# Patient Record
Sex: Female | Born: 1950 | Race: White | Hispanic: No
Health system: Midwestern US, Academic
[De-identification: ages and names within clinical notes are randomized; demographics above are authoritative.]

---

## 2016-07-22 MED ORDER — HYDROCHLOROTHIAZIDE 25 MG PO TAB
ORAL_TABLET | Freq: Every day | ORAL | 3 refills | 28.00000 days | Status: DC
Start: 2016-07-22 — End: 2017-08-12

## 2016-08-02 ENCOUNTER — Ambulatory Visit: Admit: 2016-08-02 | Discharge: 2016-08-02 | Payer: MEDICARE

## 2016-08-02 ENCOUNTER — Ambulatory Visit: Admit: 2016-08-02 | Discharge: 2016-08-03 | Payer: MEDICARE

## 2016-08-02 DIAGNOSIS — E782 Mixed hyperlipidemia: ICD-10-CM

## 2016-08-02 DIAGNOSIS — I10 Essential (primary) hypertension: ICD-10-CM

## 2016-08-02 DIAGNOSIS — R0600 Dyspnea, unspecified: ICD-10-CM

## 2016-08-02 DIAGNOSIS — R634 Abnormal weight loss: ICD-10-CM

## 2016-08-02 DIAGNOSIS — I5032 Chronic diastolic (congestive) heart failure: Principal | ICD-10-CM

## 2016-08-02 MED ORDER — AMINOPHYLLINE 500 MG/20 ML IV SOLN
50 mg | INTRAVENOUS | 0 refills | Status: AC | PRN
Start: 2016-08-02 — End: ?

## 2016-08-02 MED ORDER — PERFLUTREN LIPID MICROSPHERES 1.1 MG/ML IV SUSP
1-20 mL | Freq: Once | INTRAVENOUS | 0 refills | Status: CP
Start: 2016-08-02 — End: ?

## 2016-08-02 MED ORDER — NITROGLYCERIN 0.4 MG SL SUBL
.4 mg | SUBLINGUAL | 0 refills | Status: AC | PRN
Start: 2016-08-02 — End: ?

## 2016-08-02 MED ORDER — ALBUTEROL SULFATE 90 MCG/ACTUATION IN HFAA
2 | RESPIRATORY_TRACT | 0 refills | Status: DC | PRN
Start: 2016-08-02 — End: 2016-08-07

## 2016-08-02 MED ORDER — SODIUM CHLORIDE 0.9 % IV SOLP
250 mL | INTRAVENOUS | 0 refills | Status: AC | PRN
Start: 2016-08-02 — End: ?

## 2016-08-02 MED ORDER — REGADENOSON 0.4 MG/5 ML IV SYRG
.4 mg | Freq: Once | INTRAVENOUS | 0 refills | Status: CP
Start: 2016-08-02 — End: ?

## 2016-08-02 NOTE — Progress Notes
Peripheral IV Insertion Note:  Patient Side: left  Line Orientation:Hand  IV Catheter Size: 20G  Number of Attempts:1.  IV capped and flushed with Normal Saline.  IV site without redness, swelling, or pain.  New dressing placed.    After procedure IV cannula removed intact and hemostasis achieved.    Procedure explained, questions answered and Definity administered per standard without complications.   Total of 58ml of Definity/NS given slow IVP per sonographer direction.

## 2016-08-06 ENCOUNTER — Encounter: Admit: 2016-08-06 | Discharge: 2016-08-06 | Payer: MEDICARE

## 2016-08-06 ENCOUNTER — Ambulatory Visit: Admit: 2016-08-06 | Discharge: 2016-08-07 | Payer: MEDICARE

## 2016-08-06 DIAGNOSIS — R635 Abnormal weight gain: ICD-10-CM

## 2016-08-06 DIAGNOSIS — G5603 Carpal tunnel syndrome, bilateral upper limbs: Principal | ICD-10-CM

## 2016-08-06 DIAGNOSIS — J189 Pneumonia, unspecified organism: ICD-10-CM

## 2016-08-06 DIAGNOSIS — G936 Cerebral edema: Principal | ICD-10-CM

## 2016-08-06 DIAGNOSIS — I10 Essential (primary) hypertension: Principal | ICD-10-CM

## 2016-08-06 DIAGNOSIS — R930 Abnormal findings on diagnostic imaging of skull and head, not elsewhere classified: ICD-10-CM

## 2016-08-06 DIAGNOSIS — R634 Abnormal weight loss: ICD-10-CM

## 2016-08-06 NOTE — Progress Notes
Neurosurgery Clinic History and Physical Examination    Alicia Fox  Clinic Date:  08/09/2016                       Assessment/Plan:  Alicia Fox is a 66 y.o. female with pain and numbness in her hands left worse than right.    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  - CT head with some concern for possible abnormal hypodensity in left frontal lobe with distant history of breast cancer. - Will obtain MRI head w/wo contrast to complete examination for any mass lesion relating to symptoms  - MRI cervical spine reviewed without significant correlation between adjacent segment degeneration above prior fusion and symptoms  - Will obtain EMG/NC to evaluate nerves especially the left median distribuation.    - Following these studies patient may benefit from left carpal tunnel decompression which will be discussed at future appointment        Primary Care Provider/Referring Provider: Adriana Mccallum, MD      __________________________________________________________________________________    Chief Complaint:  Left arm and hand numbness and pain, decreased fine motor control    History of Present Illness:   Alicia Fox is a 66 y.o. right handed female who has a distant history of breast cancer undergoing multiple surgical treatments for this in approximately 1986.  She in April had a mechanical fall over an object in the house which resulted in a visit to the emergency room.  At that point a CT head was performed which showed possibility of hypodensity on the left frontal region on the CT.  She was told at that time that she had a stroke but no further MRI imaging or evaluation was performed.    After the fall she did have some mild neck pain but she describes it more as an aching in her left arm.  Her pain usually wakes her up at night especially if she is laying flat and she has to get up and move and shake her arms to get the numbness to go away.  Over the last few weeks she has noticed that her right hand is been doing this is well in the thumb and first 2 digits.  The biggest and most consistent symptoms have been numbness in her left thumb and first 2-1/2 digits with a clear acquisition in the middle of the left ring finger.  She is also noticed some mild difficulty with manipulation in her left hand rater than her right hand.  Of note she has had previous cervical 5 6 and C6-7 ACDF's these were done a number of years ago.  During the workup for this she has undergone MRI imaging of the cervical spine.  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  There does not appear to be slightly exaggerated to the left greater than the right.    Unfortunately during the time of all of this she has been caring for her parents and as of recently her father has passed away.  During this time she was having to help him significantly with ambulation causing her a lot of emotional as well as physical stress during these times.  She has not had any further episodes of falls and more specifically she has had no episodes of loss of consciousness or speech difficulty  or weakness in her arms or legs.  She has been able to loose around 30 pounds over the last couple of months intentionally.    She does state that she is currently undergoing work-up for abnormal lab finding of hypercalcemia, but so far there have not been any pathological states discovered to explain this phenomenon.    Past Medical History:  Past Medical History:   Diagnosis Date   ??? HTN (hypertension)    ??? Pneumonia     bilateral bacterial       Past Surgical History:  Past Surgical History:   Procedure Laterality Date   ??? BREAST SURGERY  07/1984    biopsy ??? BREAST SURGERY Bilateral 12/1984    mastectomy   ??? BREAST SURGERY Bilateral 07/1985    modified mastectomy   ??? BREAST SURGERY Bilateral 07/1985    implants   ??? HX DILATION AND CURETTAGE  07/1994   ??? HX HYSTERECTOMY  10/1994    complete   ??? HX ABDOMINOPLASTY  10/1994   ??? HX LIPOMA RESECTION  11/1999    outer upper left arm   ??? ANUS SURGERY  01/2010    anal fissure   ??? HX LIPOMA RESECTION  02/2010    inside upper right arm   ??? SPINE SURGERY  1993 and 1996    cervical fusion       Social History:  Social History   Substance Use Topics   ??? Smoking status: Former Smoker     Quit date: 03/04/1989   ??? Smokeless tobacco: Never Used      Comment: quit in 1991   ??? Alcohol use Yes      Comment: very seldom       Family History:  Family History   Problem Relation Age of Onset   ??? Hypertension Mother    ??? Hypertension Father        Allergies:    Allergies   Allergen Reactions   ??? Codeine HEADACHE       Medications:    Current Outpatient Prescriptions:   ???  amLODIPine (NORVASC) 2.5 mg tablet, Take 1 tablet by mouth twice daily. (Patient taking differently: Take 2.5 mg by mouth daily.), Disp: 180 tablet, Rfl: 3  ???  aspirin EC 81 mg tablet, Take 1 Tab by mouth daily., Disp: 90 Tab, Rfl: 3  ???  atorvastatin (LIPITOR) 10 mg tablet, Take 10 mg by mouth daily., Disp: , Rfl:   ???  CALCIUM POLYCARBOPHIL (FIBERCON PO), Take  by mouth twice daily., Disp: , Rfl:   ???  Cranberry Extract 500 mg cap, Take 1 capsule by mouth daily., Disp: , Rfl:   ???  Folic Acid 800 mcg tab, Take 1 tablet by mouth daily., Disp: , Rfl:   ???  furosemide (LASIX) 20 mg tablet, Take 20 mg by mouth as Needed., Disp: , Rfl:   ???  hydroCHLOROthiazide (HYDRODIURIL) 25 mg tablet, TAKE 1 TAB BY MOUTH DAILY., Disp: 90 tablet, Rfl: 3  ???  KRILL OIL-OMEGA-3-DHA-EPA PO, Take 350 mg by mouth daily., Disp: , Rfl:   ???  levothyroxine (SYNTHROID) 88 mcg tablet, Take 88 mcg by mouth daily 30 minutes before breakfast., Disp: , Rfl: ???  losartan(+) (COZAAR) 100 mg tablet, TAKE 1 TAB BY MOUTH TWICE DAILY., Disp: 180 tablet, Rfl: 3  ???  vit C,E-Zn-coppr-lutein-zeaxan (OCUVITE LUTEIN AND ZEAXANTHIN) 60 mg-30 unit- 15 mg-2 mg-6 mg cap, Take 1 tablet by mouth daily., Disp: , Rfl:     Review of  Systems:   Full 10 point review of systems negative except for HPI    Physical Exam:    BP 145/82 (BP Source: Arm, Left, Patient Position: Sitting)  - Pulse 89  - Ht 162.6 cm (64)  - Wt 115.7 kg (255 lb)  - BMI 43.77 kg/m???   General appearance: No acute distress  Lungs: Non labored on room air, symmetric chest rise  Heart: Regular rate and rhythm  Gastrointestinal: Soft, non-tender. Bowel sounds normal. no masses,  no organomegaly  Musculoskeletal: No edema, redness or tenderness in the calves or thighs  Skin: Integument intact without major lesion.  Psychiatric: Normal affect    Neurologic Exam:   Mental Status: Awake, alert and oriented x 4, fluent speech, normal cognition  Pupils: Pupils equal round and reactive to light    Cranial Nerves: CN II-XII individually tested  Vision intact to finger counting with full visual field on confrontation.  EOMI in all directions.  Facial sensation symmetric in V1,V2,V3, intact jaw clench.  Symmetric facial expressions.  Intact hearing to finger rub bilaterally.  Symmetric palatal rise. Gag not Tested.  Strong voluntary cough, voice normal phonation.  Tongue midline.    Motor:   AA EF EE WF WE G HF KF KE DF PF EHL   Left 5 5 5 5 5 4 5 5 5 5 5 5    Right 5 5 5 5 5 5 5 5 5 5 5 5    She does have mild thenar atrophy on the left versus right. She has weakness in opponens pollicis.  Positive Tinel with sensations to the shoulder on the left side.  Normal muscle bulk and tone  No pronator drift    Sensation: Sensation changes in bilateral thumb and first two digits bilaterally on the palmar surface.  The left hand clearly has a deccisation mark to the sensory change in the middle of the ring finger. The tingling and numbness that she senses during tinel test goes half way up the left forearm on the palmar side    Deep Tendon Reflexes:   Tr Bi Br Pa Ac   Left 2 2 2 2 2    Right 2 2 2 2 2    Plantar responses toes downgoing bilaterally  No clonus bilaterally  No hoffman's sign bilaterally    Gait: Normal gait,  Cerebellar: No dysmetria, No dysdiadochokinesia  Rectal: deferred    Lab Tests:      Radiology and other Diagnostics Review:          Samantha Crimes, MD

## 2016-08-06 NOTE — Progress Notes
Recd call from [place] from providers office to schedule EMG

## 2016-08-07 ENCOUNTER — Encounter: Admit: 2016-08-07 | Discharge: 2016-08-07 | Payer: MEDICARE

## 2016-08-07 ENCOUNTER — Ambulatory Visit: Admit: 2016-08-07 | Discharge: 2016-08-08 | Payer: MEDICARE

## 2016-08-07 DIAGNOSIS — R69 Illness, unspecified: Principal | ICD-10-CM

## 2016-08-07 DIAGNOSIS — J189 Pneumonia, unspecified organism: ICD-10-CM

## 2016-08-07 DIAGNOSIS — I10 Essential (primary) hypertension: Principal | ICD-10-CM

## 2016-08-07 NOTE — Procedures
Please refer to EMG report for study results.  No separate note placed for procedure.  Patient well tolerated the procedure and was sent home with no complications.

## 2016-08-08 ENCOUNTER — Encounter: Admit: 2016-08-08 | Discharge: 2016-08-08 | Payer: MEDICARE

## 2016-08-08 ENCOUNTER — Ambulatory Visit: Admit: 2016-08-08 | Discharge: 2016-08-08 | Payer: MEDICARE

## 2016-08-08 DIAGNOSIS — G936 Cerebral edema: Principal | ICD-10-CM

## 2016-08-08 DIAGNOSIS — G5603 Carpal tunnel syndrome, bilateral upper limbs: Principal | ICD-10-CM

## 2016-08-08 DIAGNOSIS — J189 Pneumonia, unspecified organism: ICD-10-CM

## 2016-08-08 DIAGNOSIS — I10 Essential (primary) hypertension: Principal | ICD-10-CM

## 2016-08-08 LAB — POC CREATININE, RAD: Lab: 0.6 mg/dL — ABNORMAL HIGH (ref 0.4–1.00)

## 2016-08-08 MED ORDER — SODIUM CHLORIDE 0.9 % IJ SOLN
10 mL | Freq: Once | INTRAVENOUS | 0 refills | Status: CP
Start: 2016-08-08 — End: ?
  Administered 2016-08-08: 19:00:00 10 mL via INTRAVENOUS

## 2016-08-08 MED ORDER — GADOBENATE DIMEGLUMINE 529 MG/ML (0.1MMOL/0.2ML) IV SOLN
20 mL | Freq: Once | INTRAVENOUS | 0 refills | Status: CP
Start: 2016-08-08 — End: ?
  Administered 2016-08-08: 19:00:00 20 mL via INTRAVENOUS

## 2016-08-13 NOTE — Progress Notes
ATTESTATION    I personally observed the resident performing the E/M, I examined the patient, discussed case with resident and patient, and concur with resident documentation of history, physical assessment and treatment plan unless otherwise noted.  Main symptom is numbness in left distal median distribution.  Tinel's and Phalen's positive at left wrist.  Extensive post-surgical change on MRI C-spine.  Will check electro-diagnsotic studies of upper extremities as she has some similar symptoms on right.  Will also check MRI brain for follow up evaluation of hypodensity noted on CT imaging.    Staff Name: Ace Gins, MD                                 Date: 08/13/2016

## 2016-08-21 ENCOUNTER — Encounter: Admit: 2016-08-21 | Discharge: 2016-08-21 | Payer: MEDICARE

## 2016-08-21 NOTE — Telephone Encounter
-----   Message from Samantha Crimes, MD sent at 08/20/2016 10:49 PM CDT -----  Please call this patient when you have a chance and inform her that both the echo and the stress test look fine. Cont the same meds.  Thank you!    ----- Message -----  From: Valda Lamb, MD  Sent: 08/02/2016   2:54 PM  To: Samantha Crimes, MD

## 2016-08-21 NOTE — Telephone Encounter
Called and discussed results with patient.  No questions at this time.  Pt will callback with any questions, concerns or problems.

## 2016-09-24 ENCOUNTER — Ambulatory Visit: Admit: 2016-09-24 | Discharge: 2016-09-25 | Payer: MEDICARE

## 2016-09-24 DIAGNOSIS — M47812 Spondylosis without myelopathy or radiculopathy, cervical region: ICD-10-CM

## 2016-09-24 DIAGNOSIS — G5603 Carpal tunnel syndrome, bilateral upper limbs: Principal | ICD-10-CM

## 2016-09-24 NOTE — Progress Notes
Alicia Fox return to the neurosurgery clinic today for her follow-up appointment.    She continues to complain of prominent numbness infecting the distal aspect of the left upper extremity.  This affects especially the first 3 digits of the left hand and the lateral aspect of the fourth digit.  It tends to awaken her at night.  She has similar but less prominent symptoms involving the right hand.    She has been evaluated by Dr. Sunday Corn and has had electricodiagnostic testing of each upper extremity.  The study demonstrated severe carpal tunnel syndrome on the left with slightly less prominent but similar findings on the right.  There were no findings of cervical radiculopathy.    She is undergone a cardiac stress test and was told that this study was stable.  She has had no recent fevers, chills, cough or other constitutional symptoms.    Her current medications include Norvasc, Lipitor, Lasix, Synthroid, Cozaar, vitamin supplementation and aspirin (81 mg).  She is allergic to codeine.    On examination, she is awake and alert with a fluent speech pattern.  To direct testing, her motor strength is 5/5 throughout.  She does have some trace thenar atrophic change and also some trace weakness of left abductor pollicis brevis.  She has diminished pinprick in the distal left median distribution.  Tinel's and Phalen's sign are positive at each wrist.    MRI examination of the brain demonstrates no obvious mass, shift of midline structures or abnormal enhancement following the administration of contrast.    I reviewed the situation carefully with [name].  As previously noted, we have not recommended surgical treatment with regard to the cervical spine.  Her clinical and electrodiagnostic testing are consistent with carpal tunnel syndrome which is more bothersome on the left.  We did discuss the background of treatment for this (carpal tunnel release) to include risks, possible complications and expected benefits.  She expresses understanding and wishes to proceed.    Arrangements will be made for a presurgery testing appointment and for scheduling of the procedure.  I gave her contact information should she have any further questions in the interim.

## 2016-10-08 ENCOUNTER — Encounter: Admit: 2016-10-08 | Discharge: 2016-10-08 | Payer: MEDICARE

## 2016-10-08 DIAGNOSIS — G5602 Carpal tunnel syndrome, left upper limb: Principal | ICD-10-CM

## 2016-10-09 ENCOUNTER — Encounter: Admit: 2016-10-09 | Discharge: 2016-10-09 | Payer: MEDICARE

## 2016-10-09 NOTE — Telephone Encounter
Alicia Fox.D in PAT called and requested Dr. Jacqlyn Krauss approval to hold aspirin for 7 days prior to carpel tunnel release.  Procedure is scheduled for 8/16.  Discussed with Dr. Virgina Organ who states that it is fine to hold.  Gave Dr. Jacqlyn Krauss recommendations to [facility].

## 2016-10-14 ENCOUNTER — Ambulatory Visit: Admit: 2016-10-14 | Discharge: 2016-10-14 | Payer: MEDICARE

## 2016-10-14 ENCOUNTER — Ambulatory Visit: Admit: 2016-10-14 | Discharge: 2016-10-15 | Payer: MEDICARE

## 2016-10-14 ENCOUNTER — Encounter: Admit: 2016-10-14 | Discharge: 2016-10-14 | Payer: MEDICARE

## 2016-10-14 ENCOUNTER — Ambulatory Visit: Admit: 2016-10-17 | Discharge: 2016-10-17 | Payer: MEDICARE

## 2016-10-14 DIAGNOSIS — M1990 Unspecified osteoarthritis, unspecified site: ICD-10-CM

## 2016-10-14 DIAGNOSIS — C801 Malignant (primary) neoplasm, unspecified: ICD-10-CM

## 2016-10-14 DIAGNOSIS — K929 Disease of digestive system, unspecified: ICD-10-CM

## 2016-10-14 DIAGNOSIS — I10 Essential (primary) hypertension: Principal | ICD-10-CM

## 2016-10-14 DIAGNOSIS — E785 Hyperlipidemia, unspecified: ICD-10-CM

## 2016-10-14 DIAGNOSIS — F99 Mental disorder, not otherwise specified: ICD-10-CM

## 2016-10-14 DIAGNOSIS — J189 Pneumonia, unspecified organism: ICD-10-CM

## 2016-10-14 DIAGNOSIS — G5600 Carpal tunnel syndrome, unspecified upper limb: Principal | ICD-10-CM

## 2016-10-14 LAB — COMPREHENSIVE METABOLIC PANEL
Lab: 0.6 mg/dL (ref 0.4–1.00)
Lab: 0.7 mg/dL (ref 0.3–1.2)
Lab: 102 MMOL/L (ref 98–110)
Lab: 11 mg/dL — ABNORMAL HIGH (ref 8.5–10.6)
Lab: 118 U/L — ABNORMAL HIGH (ref 25–110)
Lab: 12 U/L (ref 7–40)
Lab: 134 mg/dL — ABNORMAL HIGH (ref 70–100)
Lab: 139 MMOL/L — ABNORMAL HIGH (ref 137–147)
Lab: 14 U/L (ref 7–56)
Lab: 27 mg/dL — ABNORMAL HIGH (ref 7–25)
Lab: 28 MMOL/L (ref 21–30)
Lab: 3.6 MMOL/L (ref 3.5–5.1)
Lab: 4.4 g/dL (ref 3.5–5.0)
Lab: 7.7 g/dL (ref 6.0–8.0)
Lab: 9 (ref 3–12)
Lab: >60 mL/min (ref >60)

## 2016-10-14 LAB — CBC: Lab: 9 10*3/uL — ABNORMAL HIGH (ref >60)

## 2016-10-14 NOTE — Pre-Anesthesia Patient Instructions
GENERAL INFORMATION    Before you come to the hospital  ??? Make arrangements for a responsible adult to drive you home and stay with you for 24 hours following surgery.  ??? Bath/Shower Instructions  ??? Take a bath or shower using the special soap given to you in PAC. Use half the bottle the night before, and the other half the morning of your procedure. Use clean towels with each bath or shower.  ??? Put on clean clothes after bath or shower.  Avoid using lotion and oils.  ??? If you are having surgery above the waist, wear a shirt that fastens up the front.  ??? Sleep on clean sheets if bath or shower is done the night before procedure.  ??? Leave money, credit cards, jewelry, and any other valuables at home. The [facility] is not responsible for the loss or breakage of personal items.  ??? Remove nail polish, makeup and all jewelry (including piercings) before coming to the hospital.  ??? The morning of your procedure:  ??? brush your teeth and tongue  ??? do not smoke  ??? do not shave the area where you will have surgery    What to bring to the hospital  ??? ID/ Insurance Card  ??? Medical Device card  ??? Official documents for legal guardianship   ??? Copy of your Living Will, Advanced Directives, and/or Durable Power of Attorney   ??? Small bag with a few personal belongings  ??? Walker,cane, or motorized scooter  ??? Cases for glasses/hearing aids/contact lens (bring solutions for contacts)  ??? Dress in clean, loose, comfortable clothing     Eating or drinking before surgery  ??? Do not eat or drink anything after 11:00 p.m. the day before your procedure (including gum, mints, candy, or chewing tobacco) OR follow the specific instructions you were given by your Surgeon.  ??? You may have WATER ONLY up to 2 hours before arriving at the hospital.  ??? Other instructions: none     Other instructions  Notify your surgeon if:  ??? there is a possibility that you are pregnant ??? you become ill with a cough, fever, sore throat, nausea, vomiting or flu-like symptoms  ??? you have any open wounds/sores that are red, painful, draining, or are new since you last saw  the doctor  ??? you need to cancel your procedure  ??? You will receive a call with your surgery arrival time from between 2:30pm and 4:30pm the last business day before your procedure.  If you do not receive a call, please call [phone] before 4:30pm or [phone] after 4:30pm.    Notify us at [facility]: [phone]  ??? if you need to cancel your procedure  ??? if you are going to be late    Arrival at the hospital  [facility]  [address]  [place], [place] [address]    ? Park in the P5 parking garage located at [employer], [place], [place] [address].   ? Judee Clara parking is available in front of [employer] [facility] between the hours of 7:00 am and 4:00 pm Monday through Friday.  ? If parking in the P5 garage, take the east elevators in the parking garage to the second level and walk to the entrance of the [employer] [facility].    ? Enter through the 1st floor main entrance and check in with Information Desk.   ? If you are a woman between the  ages of 79 and 42, and have not had a hysterectomy, you will be asked for a urine sample prior to surgery.  Please do not urinate before arriving in the Surgery Waiting Room.  Once there, check in and let the attendant know if you need to provide a sample.

## 2016-10-17 ENCOUNTER — Ambulatory Visit: Admit: 2016-10-17 | Discharge: 2016-10-17 | Payer: MEDICARE

## 2016-10-17 ENCOUNTER — Encounter: Admit: 2016-10-17 | Discharge: 2016-10-17 | Payer: MEDICARE

## 2016-10-17 DIAGNOSIS — M1990 Unspecified osteoarthritis, unspecified site: ICD-10-CM

## 2016-10-17 DIAGNOSIS — F99 Mental disorder, not otherwise specified: ICD-10-CM

## 2016-10-17 DIAGNOSIS — J189 Pneumonia, unspecified organism: ICD-10-CM

## 2016-10-17 DIAGNOSIS — E785 Hyperlipidemia, unspecified: ICD-10-CM

## 2016-10-17 DIAGNOSIS — K929 Disease of digestive system, unspecified: ICD-10-CM

## 2016-10-17 DIAGNOSIS — G5602 Carpal tunnel syndrome, left upper limb: Principal | ICD-10-CM

## 2016-10-17 DIAGNOSIS — C801 Malignant (primary) neoplasm, unspecified: ICD-10-CM

## 2016-10-17 DIAGNOSIS — I10 Essential (primary) hypertension: Principal | ICD-10-CM

## 2016-10-17 MED ORDER — FENTANYL CITRATE (PF) 50 MCG/ML IJ SOLN
0 refills | Status: DC
Start: 2016-10-17 — End: 2016-10-17
  Administered 2016-10-17: 13:00:00 50 ug via INTRAVENOUS

## 2016-10-17 MED ORDER — FENTANYL CITRATE (PF) 50 MCG/ML IJ SOLN
25 ug | INTRAVENOUS | 0 refills | Status: DC | PRN
Start: 2016-10-17 — End: 2016-10-17
  Administered 2016-10-17: 15:00:00 25 ug via INTRAVENOUS

## 2016-10-17 MED ORDER — DEXTRAN 70-HYPROMELLOSE (PF) 0.1-0.3 % OP DPET
0 refills | Status: DC
Start: 2016-10-17 — End: 2016-10-17
  Administered 2016-10-17: 13:00:00 2 [drp] via OPHTHALMIC

## 2016-10-17 MED ORDER — PROPOFOL INJ 10 MG/ML IV VIAL
0 refills | Status: DC
Start: 2016-10-17 — End: 2016-10-17
  Administered 2016-10-17: 13:00:00 50 mg via INTRAVENOUS
  Administered 2016-10-17: 13:00:00 150 mg via INTRAVENOUS

## 2016-10-17 MED ORDER — LIDOCAINE (PF) 10 MG/ML (1 %) IJ SOLN
.1-2 mL | INTRAMUSCULAR | 0 refills | Status: DC | PRN
Start: 2016-10-17 — End: 2016-10-17

## 2016-10-17 MED ORDER — ONDANSETRON HCL (PF) 4 MG/2 ML IJ SOLN
INTRAVENOUS | 0 refills | Status: DC
Start: 2016-10-17 — End: 2016-10-17
  Administered 2016-10-17: 13:00:00 4 mg via INTRAVENOUS

## 2016-10-17 MED ORDER — FAMOTIDINE (PF) 20 MG/2 ML IV SOLN
0 refills | Status: DC
Start: 2016-10-17 — End: 2016-10-17
  Administered 2016-10-17: 13:00:00 20 mg via INTRAVENOUS

## 2016-10-17 MED ORDER — DIPHENHYDRAMINE HCL 50 MG/ML IJ SOLN
25 mg | Freq: Once | INTRAVENOUS | 0 refills | Status: DC | PRN
Start: 2016-10-17 — End: 2016-10-17

## 2016-10-17 MED ORDER — LIDOCAINE (PF) 200 MG/10 ML (2 %) IJ SYRG
0 refills | Status: DC
Start: 2016-10-17 — End: 2016-10-17
  Administered 2016-10-17: 13:00:00 120 mg via INTRAVENOUS

## 2016-10-17 MED ORDER — LIDOCAINE HCL 10 MG/ML (1 %) IJ SOLN
0 refills | Status: DC
Start: 2016-10-17 — End: 2016-10-17
  Administered 2016-10-17: 13:00:00 2 mL via INTRAMUSCULAR

## 2016-10-17 MED ORDER — SCOPOLAMINE BASE 1 MG OVER 3 DAYS TD PT3D
1 | Freq: Once | TRANSDERMAL | 0 refills | Status: DC
Start: 2016-10-17 — End: 2016-10-17

## 2016-10-17 MED ORDER — DEXAMETHASONE SODIUM PHOSPHATE 4 MG/ML IJ SOLN
INTRAVENOUS | 0 refills | Status: DC
Start: 2016-10-17 — End: 2016-10-17
  Administered 2016-10-17: 13:00:00 4 mg via INTRAVENOUS

## 2016-10-17 MED ORDER — CEFAZOLIN 1 GRAM IJ SOLR
0 refills | Status: DC
Start: 2016-10-17 — End: 2016-10-17
  Administered 2016-10-17: 13:00:00 2 g via INTRAVENOUS

## 2016-10-17 MED ORDER — DIPHENHYDRAMINE-ACETAMINOPHEN 25-500 MG PO TAB
1 | ORAL_TABLET | Freq: Every evening | ORAL | 0 refills | 8.00000 days | Status: AC
Start: 2016-10-17 — End: 2017-05-27

## 2016-10-17 MED ORDER — LACTATED RINGERS IV SOLP
1000 mL | INTRAVENOUS | 0 refills | Status: DC
Start: 2016-10-17 — End: 2016-10-17
  Administered 2016-10-17: 12:00:00 1000 mL via INTRAVENOUS

## 2016-10-17 MED ORDER — BACITRACIN 50,000 UN NS 500 ML IRR BOT (OR)
0 refills | Status: DC
Start: 2016-10-17 — End: 2016-10-17
  Administered 2016-10-17 (×2): 500 mL

## 2016-10-17 MED ORDER — MIDAZOLAM 1 MG/ML IJ SOLN
INTRAVENOUS | 0 refills | Status: DC
Start: 2016-10-17 — End: 2016-10-17
  Administered 2016-10-17: 13:00:00 2 mg via INTRAVENOUS

## 2016-10-17 MED ORDER — PROMETHAZINE 25 MG/ML IJ SOLN
6.25 mg | INTRAVENOUS | 0 refills | Status: DC | PRN
Start: 2016-10-17 — End: 2016-10-17

## 2016-10-17 MED ORDER — ACETAMINOPHEN 500 MG PO TAB
1000 mg | Freq: Once | ORAL | 0 refills | Status: CP
Start: 2016-10-17 — End: ?
  Administered 2016-10-17: 15:00:00 1000 mg via ORAL

## 2016-10-17 MED ADMIN — SCOPOLAMINE BASE 1 MG OVER 3 DAYS TD PT3D [82141]: 1 | TRANSDERMAL | @ 13:00:00 | Stop: 2016-10-17 | NDC 66758020858

## 2016-10-17 NOTE — Operative Report(Direct Entry)
OPERATIVE REPORT    PATIENT NAME:??? Alicia Fox  PATIENT DOB: 1951/02/02    MR#:??? [id number]    DATE OF OPERATION: 10/17/2016    Surgeon(s) and Role:     * Stepp, Sheppard Plumber, MD - Primary     * Gracy Bruins, MD - Resident - Observing     * Samantha Crimes, MD - Resident - Assisting     * Sami, Erie Noe, MD - Resident - Assisting     * Jettie Booze, MD - Resident - Assisting    PREOPERATIVE DIAGNOSIS:  Carpal tunnel syndrome of left wrist [G56.02]    POSTOPERATIVE DIAGNOSIS:  Same.    OPERATIVE PROCEDURE:  1. Procedure(s) (LRB):  LEFT DECOMPRESSION NERVE MEDIAN WITH CARPAL TUNNEL RELEASE (Left)      ANESTHESIA:  General          INDICATIONS FOR OPERATIVE PROCEDURE:???   [name] is a 66yo female who was evaluate for symptoms of bilateral carpal tunnel syndrome.  Her symptoms and EMG studies were consistent with left worse than right carpal tunnel syndrome.  All risks, benefits, and alternatives to left open carpal tunnel decompression of the median nerve were discussed with the patient and she wished to proceed with surgery.    DESCRIPTION AND FINDINGS OF OPERATIVE PROCEDURE:???   After obtaining informed consent the patient was taken to the OR and LMA induction was performed.  The patient was positioned supine on OR table with the left arm accessible.  The area for incision was drawn out using anatomical landmarks. The area was cleansed, prepped, and draped in normal sterile fashion. Timeout was performed.    The skin was infiltrated with 1% lidocaine with epinephrine.  Incision was made and dissection was carried down to the level of the transverse carpal ligament.  Hemostasis was obtained.  Soft training retractors placed in the wound.    The transverse carpal ligament was identified centrally and opened sharply.  Dissection along the carpal tunnel was performed with a 4 Penfield.  After ensuring no adhesions of the nerve to the ligamen the remainder of the ligament was opened with sharp tenotomy scissors proximally and distally.  There not appear to be any compressive adhesions across the length of the nerve through the carpal tunnel.  Dissection was carried distally until the palmar fat pad was identified.    At this point hemostasis obtained.  The wound was copiously irrigated with bacitracin irrigation.  The deep dermal tissue was approximated with simple inverted interrupted 3-0 Vicryl suture.  The skin was approximated with simple interrupted 4-0 Prolene suture.  The wound was dressed in sterile fashion and the patient's hand was placed in a wrap.  All sponge counts, equipment counts, needle counts were correct x2 at the end of the case.    The patient was allowed to awaken from sedation and the LMA was removed.  The patient was transferred to PACU in stable condition following commands x4.    ESTIMATED BLOOD LOSS:??? 10ml    SPECIMENS REMOVED:??? * No specimens in log *.    COMPLICATIONS:??? none.    IMPLANTS:??? none.    DRAINS:??? none.    DISPOSITION:??? PACU in stable condition.    [name], MD  [phone]    Please call Neurosurgery Call Pager [phone]) with any questions.

## 2016-10-17 NOTE — Operative Report(Direct Entry)
OPERATIVE REPORT    Name: Alicia Fox is a 66 y.o. female     DOB: 24-Dec-1950             MRN#: [id number]    DATE OF OPERATION: 10/17/2016    Surgeon(s) and Role:     * Mera Gunkel, Sheppard Plumber, MD - Primary     * Gracy Bruins, MD - Resident - Observing     * Samantha Crimes, MD - Resident - Assisting     * Sami, Erie Noe, MD - Resident - Assisting     * Jettie Booze, MD - Resident - Assisting        Preoperative Diagnosis:    Carpal tunnel syndrome of left wrist [G56.02]    Post-op Diagnosis      * Carpal tunnel syndrome of left wrist [G56.02]    Procedure(s) (LRB):  LEFT DECOMPRESSION NERVE MEDIAN WITH CARPAL TUNNEL RELEASE (Left)    Anesthesia Type: General          Description and Findings of Operative Procedure:   Alicia Fox is a very nice 66 year old white female with numbness and pain in the left distal median distribution consistent with carpal tunnel syndrome.  Electric O diagnostic testing also was consistent with left carpal tunnel syndrome.  The risks and possible complications of this procedure were carefully reviewed.  These include anesthesia, bleeding, infection, failure to improve symptoms, recurrence of symptoms and motor weakness of the left hand.  She expresses understanding and wishes to proceed.  She was pretreated with intravenous antibiotic and she was placed under anesthesia with airway protection.  The operative field was prepped and draped in sterile fashion.  We carefully padded the left arm along its entirety and elevated the left hand so that the base of the palm was appropriately exposed.  A linear incision in line with the crease between the third and fourth digit in the palmar longus tendon was infiltrated with 1% lidocaine without epinephrine incision extended from the distal crease of the wrist to a point with the thumb extended. The operation was performed with magnification.  After checking for satisfactory topical anesthesia, a skin incision was carried down exposing the palmar aponeurosis.  This was sharply divided using a 15 blade knife.  A self-retaining retractor was placed.  We identified the transverse carpal ligament.  Working in distal to proximal fashion, we divided the ligament under direct visualization with a 15 blade knife.  We exposed the median nerve as it exited from beneath the ligament.  A plane was developed between the nerve and ligament and the ligament was divided sharply using the tenotomy scissors.  We decompressed the nerve into the wrist until it was fully freed.  The distal end of the nerve blended nicely with the fat.  The transverse carpal ligament was very thickened and the nerve had been significantly compressed.  We copiously irrigated the field with antibiotic irrigation.  Hemostasis was achieved with bipolar cautery.  The skin edges were then reapproximated using an interrupted suture of 4-0 Prolene.  Sterile fluffy dressing followed by an Ace wrap were applied.  Sponge and needle counts were correct.  At the completion of the case, the patient was delivered to recovery room with stable vital signs still clearing the effects of anesthesia.  Shortly thereafter, she was noted to be awake and alert and was moving her left hand normal fashion.  There were no complications.    Estimated Blood Loss:  No blood loss  documented.     Specimen(s) Removed/Disposition: * No specimens in log *    Attestation: I performed this procedure with a resident.    Delice Lesch, MD  Pager

## 2016-10-17 NOTE — Anesthesia Post-Procedure Evaluation
Post-Anesthesia Evaluation    Name: Alicia Fox      MRN: [id number]     DOB: 1950/05/04     Age: 66 y.o.     Sex: female   __________________________________________________________________________     Procedure Date: 10/17/2016  Procedure: Procedure(s) with comments:  LEFT DECOMPRESSION NERVE MEDIAN WITH CARPAL TUNNEL RELEASE - CASE LENGTH 1 HOUR, REQUEST 0800 START      Surgeon: Surgeon(s):  Stepp, Lew Dawes, MD  Mellody Life, MD  Renella Cunas, MD  Elisha Ponder, MD  Derry Lory, MD    Post-Anesthesia Vitals  BP: 035/59 805 057 2371 1015)  Pulse: 78 (08/16 1015)  Respirations: 15 PER MINUTE (08/16 1015)  SpO2: 96 % (08/16 1015)  O2 Delivery: None (Room Air) (08/16 1015)  SpO2 Pulse: 77 (08/16 1015)      Post Anesthesia Evaluation Note    Evaluation location: pre/post  Patient participation: recovered; patient participated in evaluation  Level of consciousness: alert    Pain score: 6  Pain management: adequate    Hydration: normovolemia  Temperature: 36.0C - 38.4C  Airway patency: adequate    Perioperative Events  Perioperative events:  no       Post-op nausea and vomiting: no PONV    Postoperative Status  Cardiovascular status: hemodynamically stable  Respiratory status: spontaneous ventilation  Follow-up needed: none        Perioperative Events  Perioperative Event: No

## 2016-10-17 NOTE — H&P (View-Only)
There has been no change since the H & P from 7/24.  She has numbness and tingling in left distal median distribution and has electrodiagnostic testing consistent with left carpal tunnel syndrome.  We have carefully reviewed the risks and  possible complications ans she expresses understanding and wishes to proceed.

## 2016-10-17 NOTE — Other
Brief Operative Note    Name: Alicia Fox is a 66 y.o. female     DOB: 1950-07-29             MRN#: [id number]  DATE OF OPERATION: 10/17/2016    Date:  10/17/2016        Preoperative Dx:   Carpal tunnel syndrome of left wrist [G56.02]    Post-op Diagnosis      * Carpal tunnel syndrome of left wrist [G56.02]    Procedure(s) (LRB):  LEFT DECOMPRESSION NERVE MEDIAN WITH CARPAL TUNNEL RELEASE (Left)    Anesthesia Type: General    Surgeon(s) and Role:     * Stepp, Lew Dawes, MD - Primary     * Mellody Life, MD - Resident - Observing     * Renella Cunas, MD - Resident - Assisting     * Sami, Dennard Schaumann, MD - Resident - Assisting     * Derry Lory, MD - Resident - Assisting      Findings:  Left carpal tunnel with good release of the transverse carpal ligament with good decompression of the nerve from proximal and distal.    Estimated Blood Loss: minimal     Specimen(s) Removed/Disposition: * No specimens in log *    Complications:  None    Implants: None    Drains: None    Disposition:  PACU - stable    Renella Cunas, MD  Pager [phone]

## 2016-10-21 ENCOUNTER — Encounter: Admit: 2016-10-21 | Discharge: 2016-10-21 | Payer: MEDICARE

## 2016-10-21 DIAGNOSIS — I10 Essential (primary) hypertension: Principal | ICD-10-CM

## 2016-10-21 DIAGNOSIS — J189 Pneumonia, unspecified organism: ICD-10-CM

## 2016-10-21 DIAGNOSIS — E785 Hyperlipidemia, unspecified: ICD-10-CM

## 2016-10-21 DIAGNOSIS — K929 Disease of digestive system, unspecified: ICD-10-CM

## 2016-10-21 DIAGNOSIS — F99 Mental disorder, not otherwise specified: ICD-10-CM

## 2016-10-21 DIAGNOSIS — C801 Malignant (primary) neoplasm, unspecified: ICD-10-CM

## 2016-10-21 DIAGNOSIS — M1990 Unspecified osteoarthritis, unspecified site: ICD-10-CM

## 2016-10-29 ENCOUNTER — Ambulatory Visit: Admit: 2016-10-29 | Discharge: 2016-10-30 | Payer: MEDICARE

## 2016-10-29 ENCOUNTER — Encounter: Admit: 2016-10-29 | Discharge: 2016-10-29 | Payer: MEDICARE

## 2016-10-29 DIAGNOSIS — F99 Mental disorder, not otherwise specified: ICD-10-CM

## 2016-10-29 DIAGNOSIS — M1990 Unspecified osteoarthritis, unspecified site: ICD-10-CM

## 2016-10-29 DIAGNOSIS — E785 Hyperlipidemia, unspecified: ICD-10-CM

## 2016-10-29 DIAGNOSIS — C801 Malignant (primary) neoplasm, unspecified: ICD-10-CM

## 2016-10-29 DIAGNOSIS — K929 Disease of digestive system, unspecified: ICD-10-CM

## 2016-10-29 DIAGNOSIS — I10 Essential (primary) hypertension: Principal | ICD-10-CM

## 2016-10-29 DIAGNOSIS — J189 Pneumonia, unspecified organism: ICD-10-CM

## 2016-10-29 DIAGNOSIS — G5603 Carpal tunnel syndrome, bilateral upper limbs: Principal | ICD-10-CM

## 2016-10-29 DIAGNOSIS — G5602 Carpal tunnel syndrome, left upper limb: ICD-10-CM

## 2016-10-29 NOTE — Progress Notes
Alicia Fox returns today for post operative visit s/p LEFT DECOMPRESSION NERVE MEDIAN WITH CARPAL TUNNEL RELEASE (Left) on 10/17/16 by Dr. Kristine Linea.

## 2016-10-29 NOTE — Progress Notes
Neurosurgery Clinic Follow-up    Patient Active Problem List    Diagnosis Date Noted   ??? Carpal tunnel syndrome of left wrist 10/09/2016     Overview Note:     Added automatically from request for surgery [id number]     ??? 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 Hypercalcemia 08/09/2016   ??? Bilateral carpal tunnel syndrome 08/06/2016   ??? Abnormal CT of the head 08/06/2016   ??? Weight loss 10/31/2015   ??? Cough 08/09/2014   ??? Framingham cardiac risk <10% in next 10 years 08/09/2014   ??? Medication side effects 05/18/2014   ??? Weight gain 04/28/2014   ??? Bilateral edema of lower extremity 04/28/2014   ??? Dyspnea on exertion 04/28/2014   ??? Diastolic heart failure (HCC) 04/28/2014   ??? Hyperlipemia 01/18/2010   ??? Hypertension 07/21/2008   ??? 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 Obesity 07/21/2008        Alicia Fox is a 66 year old female who was evaluated in the neurosurgery resident clinic.  We have been following her for bilateral carpal tunnel syndrome.  She underwent an EMG study which showed that the compression of the median nerve at the carpal tunnel was worse on the left side.  She elected to undergo a left decompression of the median nerve at the wrist on 10/17/2016.    She is 12 days out from surgery.  Her sutures were removed.  She unfortunately has not regained any significant sensation in her left lateral fingers.  She still has full range of motion with her digits.  She has continued to wrap her wrist slightly at night and extension as this does seem to help.  She does get occasional twinges and some soreness end of her hands.    We had a long discussion that her EMG showed that she had a significant amount of compression at the wrist and that it is not unusual for the healing process and recovery to take several months to show benefit.  She also mentioned that after talking Dr. Sunday Corn that the left side being in the state that was on the EMG may not ever regain full function despite decompression.    We discussed at this point it was too early to tell them that we would see her in follow-up in 1 month to see how she is doing and at that time we could also possibly talk about doing the right side which was still evident to be severe compression but not to the extent of the left side.    Exam:  Awake, alert, oriented  Full range of motion with all extremities.  Some weakness in the left thumb abduction.  Incision appears well-healed without fluctuance or erythema.    Plan:  We will plan to see her in 1 month's time for follow-up of her recovery as well as to possibly start discussion of decompression on the right.        Please call [phone] with questions or concern    Samantha Crimes, MD  ATTESTATION    I personally observed the resident performing the E/M, I examined the patient, discussed case with resident and patient, and concur with resident documentation of history, physical assessment and treatment plan unless otherwise noted.  Alicia Fox is seen for follow-up visit after her recent carpal tunnel release.  She reports that the symptoms of numbness and tingling that he is to awaken her at night no longer do so.  During the day she still feels somewhat numb in her hand.  Her incision is healing very well.  She will gradually increase the use of her hand and will use gentle soap and water over the incision.  We will see her back in 4 weeks for follow-up appointment and initiate physical therapy if it is indicated.  She will call sooner with any other questions or concerns.    Staff Name: Delice Lesch, MD                                 Date: 10/30/2016

## 2016-11-26 ENCOUNTER — Ambulatory Visit: Admit: 2016-11-26 | Discharge: 2016-11-27 | Payer: MEDICARE

## 2016-11-26 ENCOUNTER — Encounter: Admit: 2016-11-26 | Discharge: 2016-11-26 | Payer: MEDICARE

## 2016-11-26 DIAGNOSIS — M1990 Unspecified osteoarthritis, unspecified site: ICD-10-CM

## 2016-11-26 DIAGNOSIS — F99 Mental disorder, not otherwise specified: ICD-10-CM

## 2016-11-26 DIAGNOSIS — J189 Pneumonia, unspecified organism: ICD-10-CM

## 2016-11-26 DIAGNOSIS — I10 Essential (primary) hypertension: Principal | ICD-10-CM

## 2016-11-26 DIAGNOSIS — G5601 Carpal tunnel syndrome, right upper limb: ICD-10-CM

## 2016-11-26 DIAGNOSIS — K929 Disease of digestive system, unspecified: ICD-10-CM

## 2016-11-26 DIAGNOSIS — E785 Hyperlipidemia, unspecified: ICD-10-CM

## 2016-11-26 DIAGNOSIS — G5603 Carpal tunnel syndrome, bilateral upper limbs: Principal | ICD-10-CM

## 2016-11-26 DIAGNOSIS — G5602 Carpal tunnel syndrome, left upper limb: ICD-10-CM

## 2016-11-26 DIAGNOSIS — C801 Malignant (primary) neoplasm, unspecified: ICD-10-CM

## 2016-11-28 NOTE — Progress Notes
Neurosurgery Clinic Follow-up    Patient Active Problem List    Diagnosis Date Noted   ??? Carpal tunnel syndrome of left wrist 10/09/2016     Overview Note:     Added automatically from request for surgery [id number]     ??? 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 Hypercalcemia 08/09/2016   ??? Bilateral carpal tunnel syndrome 08/06/2016   ??? Abnormal CT of the head 08/06/2016   ??? Weight loss 10/31/2015   ??? Cough 08/09/2014   ??? Framingham cardiac risk <10% in next 10 years 08/09/2014   ??? Medication side effects 05/18/2014   ??? Weight gain 04/28/2014   ??? Bilateral edema of lower extremity 04/28/2014   ??? Dyspnea on exertion 04/28/2014   ??? Diastolic heart failure (HCC) 04/28/2014   ??? Hyperlipemia 01/18/2010   ??? Hypertension 07/21/2008   ??? 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 Obesity 07/21/2008        Bellamie Turney is a 66 year old female who under went left decompression of the median nerve at carpal tunnel on 10/17/2016.  Today she is doing well.  She does not get the pain up her arm or pain waking upper at night on the left side anymore.  She still has a significant amount of numbness and tingling in her fingers.  She will occasionally get some twinges of pain when she is in maximal flexion or extension with her wrist.  Overall she is pleased with the way the incision is healed.    We also know that she has right-sided carpal tunnel at the wrist.  She has noticed an increase in her symptoms and tingling in her right hand now that she has been using it more post left sided procedure.    We discussed that she could try some massaging and hand physiotherapy on the left.  She is also going to start using some vitamin E oil over the incision site.    Overall we discussed returning in about 3 weeks her medical evaluation and see how her left hand is doing.  If functionally she is getting along pretty well then we will discuss scheduling her for the right sided carpal tunnel release.    Exam: Awake, alert, oriented  Follows commands x4.  She still has thenar eminence wasting on the left hand worse than the right.  Her grip strength on the left is 4 out of 5.  Her incision is well-healed without erythema fluctuance or tenderness.  Gait is normal without imbalance  Decreased sensation in the left hand first second third digits.    Plan:  He will return in 3 weeks to see [place] and determine if she is functionally doing well with her left hand in order to proceed with carpal tunnel release of the right side.      Please call [phone] with questions or concern    Samantha Crimes, MD    ATTESTATION    I personally observed the resident performing the E/M, I examined the patient, discussed case with resident and patient, and concur with resident documentation of history, physical assessment and treatment plan unless otherwise noted.  Ms. Louthan is making good progress following her left carpal tunnel release.  She no longer has pain waking her at night.  She has some residual numbness in the distal median distribution but this is improving.  Her incision is healing nicely.  She is going to begin massaging around the area and using her hand to  do some simple exercises.  We will see her back in a few weeks and make a final decision about timing for her right carpal tunnel release.  She will call sooner with any other questions or concerns.    Staff Name: Delice Lesch, MD                                 Date: 11/29/2016

## 2016-12-16 ENCOUNTER — Encounter: Admit: 2016-12-16 | Discharge: 2016-12-17 | Payer: MEDICARE

## 2016-12-16 DIAGNOSIS — R69 Illness, unspecified: Principal | ICD-10-CM

## 2016-12-17 ENCOUNTER — Encounter: Admit: 2016-12-17 | Discharge: 2016-12-17 | Payer: MEDICARE

## 2016-12-17 ENCOUNTER — Ambulatory Visit: Admit: 2016-12-17 | Discharge: 2016-12-18 | Payer: MEDICARE

## 2016-12-17 DIAGNOSIS — R69 Illness, unspecified: Principal | ICD-10-CM

## 2016-12-17 DIAGNOSIS — D491 Neoplasm of unspecified behavior of respiratory system: Principal | ICD-10-CM

## 2016-12-17 DIAGNOSIS — G5603 Carpal tunnel syndrome, bilateral upper limbs: ICD-10-CM

## 2016-12-17 NOTE — Progress Notes
Alicia Fox return to the clinic today for her follow-up appointment.      She is now 2 months out from her left carpal tunnel release.  She is feeling significantly better and describes essentially no pain and much less numbness.  She has some slight tenderness when she rotates her wrist.    She provides a history that, shortly after her last appointment here, she developed an aggravating cough.  She was treated with several courses of antibiotics and oral steroids.  As her symptoms persisted, her primary care physician sent her for a chest x-ray.  There was a concern over a mass; therefore, yesterday she underwent a CT examination of the chest at [facility].  With the patient's permission, I did receive the radiology report indicating that there is a lesion in the left lung.  The patient denies hemoptysis.  She occasionally feels short of breath.  Her weight has been stable.    On examination, she is awake and alert with a fluent speech pattern.  Her left carpal tunnel incision is nicely healed.  The left distal median musculature is 5/5.    The patient requested referral for evaluation of the lung mass at [facility].  Accordingly, her CT examination of the chest has been placed on the [facility] PACS system.  I personally spoke with Dr. Cira Servant of the Pulmonary Medicine department who has kindly agreed to have the patient seen within a week in their clinic.  At this time, we will defer on scheduling the right carpal tunnel release until she has had evaluation of the lung mass.  She will call sooner with any other questions or concerns.

## 2016-12-18 ENCOUNTER — Encounter: Admit: 2016-12-18 | Discharge: 2016-12-18 | Payer: MEDICARE

## 2016-12-18 ENCOUNTER — Ambulatory Visit: Admit: 2016-12-18 | Discharge: 2016-12-18 | Payer: MEDICARE

## 2016-12-18 DIAGNOSIS — F99 Mental disorder, not otherwise specified: ICD-10-CM

## 2016-12-18 DIAGNOSIS — M1990 Unspecified osteoarthritis, unspecified site: ICD-10-CM

## 2016-12-18 DIAGNOSIS — K929 Disease of digestive system, unspecified: ICD-10-CM

## 2016-12-18 DIAGNOSIS — C801 Malignant (primary) neoplasm, unspecified: ICD-10-CM

## 2016-12-18 DIAGNOSIS — I10 Essential (primary) hypertension: Principal | ICD-10-CM

## 2016-12-18 DIAGNOSIS — E785 Hyperlipidemia, unspecified: ICD-10-CM

## 2016-12-18 DIAGNOSIS — J189 Pneumonia, unspecified organism: ICD-10-CM

## 2016-12-18 DIAGNOSIS — R918 Other nonspecific abnormal finding of lung field: Principal | ICD-10-CM

## 2016-12-18 NOTE — Progress Notes
Subjective:       History of Present Illness  Alicia Fox is a 66 y.o. female.  I had the pleasure of seeing Alicia Fox in our Pulmonary Clinic today.  She was referred for an abnormal CT scan of the chest.  She states that she has had a cough for a couple of months.  She was on a couple rounds of antibiotics as well as steroids but the cough did not improve.  She was a smoker but quit several years ago - she smoked about 1 ppd.  She has never been diagnosed with any other lung problems.  Denies h/o of childhood asthma, recurrent pneumonias.  She is more short of breath than she was several months ago.          Review of Systems  Denies weight loss, fever, fatigue, chills  +cough; +increased shortness of breath  Denies chest pain or palpitations  Denies increased LE swelling  Denies syncope or presyncope    Objective:         ??? amLODIPine (NORVASC) 2.5 mg tablet Take 1 tablet by mouth twice daily.   ??? aspirin EC 81 mg tablet Take 1 Tab by mouth daily.   ??? atorvastatin (LIPITOR) 10 mg tablet Take 10 mg by mouth at bedtime daily.   ??? cetirizine (ZYRTEC) 10 mg tablet Take 10 mg by mouth daily.   ??? cinnamon bark (CINNAMON PO) Take 1 tablet by mouth daily.   ??? Cranberry Extract 500 mg cap Take 1 capsule by mouth daily.   ??? Diphenhydramine-Acetaminophen (TYLENOL PM EXTRA STRENGTH) 25-500 mg tab tablet Take 1 tablet by mouth at bedtime daily.   ??? Folic Acid 800 mcg tab Take 1 tablet by mouth daily.   ??? furosemide (LASIX) 20 mg tablet Take 20 mg by mouth daily as needed.   ??? glucosamine HCl/chondroitin su (GLUCOSAMINE-CHONDROITIN PO) Take 1 tablet by mouth daily.   ??? hydroCHLOROthiazide (HYDRODIURIL) 25 mg tablet TAKE 1 TAB BY MOUTH DAILY.   ??? KRILL OIL-OMEGA-3-DHA-EPA PO Take 350 mg by mouth daily.   ??? levothyroxine (SYNTHROID) 88 mcg tablet Take 88 mcg by mouth daily 30 minutes before breakfast.   ??? losartan(+) (COZAAR) 100 mg tablet TAKE 1 TAB BY MOUTH TWICE DAILY. ??? pantoprazole DR (PROTONIX) 40 mg tablet Take 40 mg by mouth daily.   ??? polycarbophil (FIBERCON) 625 mg tablet Take 1,250 mg by mouth twice daily.   ??? TURMERIC ROOT EXTRACT PO Take 1 tablet by mouth daily.   ??? vit C,E-Zn-coppr-lutein-zeaxan (OCUVITE LUTEIN AND ZEAXANTHIN) 60 mg-30 unit- 15 mg-2 mg-6 mg cap Take 1 tablet by mouth daily.     Vitals:    12/18/16 1005   BP: 164/81   Pulse: 91   Resp: 16   Temp: 37.2 ???C (98.9 ???F)   TempSrc: Oral   SpO2: 93%   Weight: 121.5 kg (267 lb 12.8 oz)   Height: 162.6 cm (64)     Body mass index is 45.97 kg/m???.     Physical Exam    Gen:  Awake and alert; NAD  Head:  Normocephalic  ENT:  PERRL EOMI  Chest:  cTA bilaterally  CV:  RRR without murmur  AB::  Soft/nontender/nondistended  Ext:  No edema  Neuro:  No focal deficits  Skin:  No visibile rashes     Assessment and Plan:  1.  Pulmonary mass - she is at increased risk for malignancy due to past smoking history but discussed with Alicia Fox and  Alicia Fox that we can't know for sure until we have a biopsy   -will plan to get a PET/CT scan   -Then will refer to our procedure team for EBUS and possible navigational bronchoscopy   -will order full PFT's as she might be a surgical candidate depending on PET and EBUS results   -CT-guided biopsy would also be an option    She has an upcoming trip that she has been planning for a long time with family this coming week.  I told Alicia that we would continue testing after she got back, which is what she wished to do.

## 2016-12-19 ENCOUNTER — Encounter: Admit: 2016-12-19 | Discharge: 2016-12-19 | Payer: MEDICARE

## 2016-12-19 DIAGNOSIS — R0602 Shortness of breath: Principal | ICD-10-CM

## 2016-12-19 DIAGNOSIS — R911 Solitary pulmonary nodule: ICD-10-CM

## 2016-12-20 ENCOUNTER — Encounter: Admit: 2016-12-20 | Discharge: 2016-12-20 | Payer: MEDICARE

## 2016-12-20 MED ORDER — AMLODIPINE 2.5 MG PO TAB
2.5 mg | ORAL_TABLET | Freq: Two times a day (BID) | ORAL | 3 refills | Status: AC
Start: 2016-12-20 — End: 2018-01-05

## 2016-12-25 ENCOUNTER — Encounter: Admit: 2016-12-25 | Discharge: 2016-12-25 | Payer: MEDICARE

## 2016-12-25 DIAGNOSIS — J189 Pneumonia, unspecified organism: ICD-10-CM

## 2016-12-25 DIAGNOSIS — C801 Malignant (primary) neoplasm, unspecified: ICD-10-CM

## 2016-12-25 DIAGNOSIS — K929 Disease of digestive system, unspecified: ICD-10-CM

## 2016-12-25 DIAGNOSIS — E785 Hyperlipidemia, unspecified: ICD-10-CM

## 2016-12-25 DIAGNOSIS — M1990 Unspecified osteoarthritis, unspecified site: ICD-10-CM

## 2016-12-25 DIAGNOSIS — I10 Essential (primary) hypertension: Principal | ICD-10-CM

## 2016-12-25 DIAGNOSIS — F99 Mental disorder, not otherwise specified: ICD-10-CM

## 2017-01-03 ENCOUNTER — Encounter: Admit: 2017-01-03 | Discharge: 2017-01-03 | Payer: MEDICARE

## 2017-01-03 ENCOUNTER — Ambulatory Visit: Admit: 2017-01-03 | Discharge: 2017-01-03 | Payer: MEDICARE

## 2017-01-03 ENCOUNTER — Ambulatory Visit: Admit: 2017-01-03 | Discharge: 2017-01-04 | Payer: MEDICARE

## 2017-01-03 DIAGNOSIS — R918 Other nonspecific abnormal finding of lung field: Principal | ICD-10-CM

## 2017-01-03 DIAGNOSIS — R0609 Other forms of dyspnea: ICD-10-CM

## 2017-01-03 LAB — POC GLUCOSE: Lab: 138 mg/dL — ABNORMAL HIGH (ref 70–100)

## 2017-01-03 MED ORDER — RP DX F-18 FDG MCI
15 | Freq: Once | INTRAVENOUS | 0 refills | Status: CP
Start: 2017-01-03 — End: ?
  Administered 2017-01-03: 15:00:00 15.9 via INTRAVENOUS

## 2017-01-06 ENCOUNTER — Encounter: Admit: 2017-01-06 | Discharge: 2017-01-06 | Payer: MEDICARE

## 2017-01-06 DIAGNOSIS — R918 Other nonspecific abnormal finding of lung field: Principal | ICD-10-CM

## 2017-01-06 MED ORDER — SODIUM CHLORIDE 0.9 % IV SOLP
INTRAVENOUS | 0 refills | Status: CN
Start: 2017-01-06 — End: ?

## 2017-01-13 ENCOUNTER — Ambulatory Visit: Admit: 2017-01-13 | Discharge: 2017-01-13 | Payer: MEDICARE

## 2017-01-13 ENCOUNTER — Encounter: Admit: 2017-01-13 | Discharge: 2017-01-13 | Payer: MEDICARE

## 2017-01-13 DIAGNOSIS — E785 Hyperlipidemia, unspecified: ICD-10-CM

## 2017-01-13 DIAGNOSIS — I10 Essential (primary) hypertension: Principal | ICD-10-CM

## 2017-01-13 DIAGNOSIS — R05 Cough: Principal | ICD-10-CM

## 2017-01-13 DIAGNOSIS — K929 Disease of digestive system, unspecified: ICD-10-CM

## 2017-01-13 DIAGNOSIS — Z87891 Personal history of nicotine dependence: Secondary | ICD-10-CM

## 2017-01-13 DIAGNOSIS — M1990 Unspecified osteoarthritis, unspecified site: ICD-10-CM

## 2017-01-13 DIAGNOSIS — G5603 Carpal tunnel syndrome, bilateral upper limbs: ICD-10-CM

## 2017-01-13 DIAGNOSIS — J189 Pneumonia, unspecified organism: ICD-10-CM

## 2017-01-13 DIAGNOSIS — C801 Malignant (primary) neoplasm, unspecified: ICD-10-CM

## 2017-01-13 DIAGNOSIS — F99 Mental disorder, not otherwise specified: ICD-10-CM

## 2017-01-13 LAB — GRAM STAIN

## 2017-01-13 MED ORDER — ONDANSETRON HCL (PF) 4 MG/2 ML IJ SOLN
INTRAVENOUS | 0 refills | Status: DC
Start: 2017-01-13 — End: 2017-01-13
  Administered 2017-01-13: 17:00:00 4 mg via INTRAVENOUS

## 2017-01-13 MED ORDER — SUGAMMADEX 100 MG/ML IV SOLN
0 refills | Status: DC
Start: 2017-01-13 — End: 2017-01-13
  Administered 2017-01-13: 19:00:00 246 mg via INTRAVENOUS

## 2017-01-13 MED ORDER — LIDOCAINE (PF) 200 MG/10 ML (2 %) IJ SYRG
0 refills | Status: DC
Start: 2017-01-13 — End: 2017-01-13
  Administered 2017-01-13: 16:00:00 60 mg via INTRAVENOUS
  Administered 2017-01-13: 17:00:00 20 mg via INTRAVENOUS

## 2017-01-13 MED ORDER — PROMETHAZINE 25 MG/ML IJ SOLN
6.25 mg | INTRAVENOUS | 0 refills | Status: DC | PRN
Start: 2017-01-13 — End: 2017-01-13

## 2017-01-13 MED ORDER — PHENYLEPHRINE IN 0.9% NACL(PF) 1 MG/10 ML (100 MCG/ML) IV SYRG
INTRAVENOUS | 0 refills | Status: DC
Start: 2017-01-13 — End: 2017-01-13
  Administered 2017-01-13: 17:00:00 50 ug via INTRAVENOUS
  Administered 2017-01-13 (×6): 100 ug via INTRAVENOUS
  Administered 2017-01-13: 17:00:00 150 ug via INTRAVENOUS
  Administered 2017-01-13 (×5): 100 ug via INTRAVENOUS
  Administered 2017-01-13: 17:00:00 200 ug via INTRAVENOUS

## 2017-01-13 MED ORDER — SUCCINYLCHOLINE CHLORIDE 20 MG/ML IJ SOLN
INTRAVENOUS | 0 refills | Status: DC
Start: 2017-01-13 — End: 2017-01-13
  Administered 2017-01-13: 16:00:00 120 mg via INTRAVENOUS

## 2017-01-13 MED ORDER — FENTANYL CITRATE (PF) 50 MCG/ML IJ SOLN
0 refills | Status: DC
Start: 2017-01-13 — End: 2017-01-13

## 2017-01-13 MED ORDER — FENTANYL CITRATE (PF) 50 MCG/ML IJ SOLN
25 ug | INTRAVENOUS | 0 refills | Status: DC | PRN
Start: 2017-01-13 — End: 2017-01-13
  Administered 2017-01-13 (×2): 25 ug via INTRAVENOUS

## 2017-01-13 MED ORDER — METOCLOPRAMIDE HCL 5 MG/ML IJ SOLN
10 mg | Freq: Once | INTRAVENOUS | 0 refills | Status: DC | PRN
Start: 2017-01-13 — End: 2017-01-13

## 2017-01-13 MED ORDER — PROPOFOL 10 MG/ML IV EMUL 20 ML (INFUSION)(AM)(OR)
0 refills | Status: DC
Start: 2017-01-13 — End: 2017-01-13
  Administered 2017-01-13: 16:00:00 80 ug/kg/min via INTRAVENOUS

## 2017-01-13 MED ORDER — ROCURONIUM 10 MG/ML IV SOLN
0 refills | Status: DC
Start: 2017-01-13 — End: 2017-01-13
  Administered 2017-01-13 (×2): 20 mg via INTRAVENOUS
  Administered 2017-01-13: 19:00:00 5 mg via INTRAVENOUS

## 2017-01-13 MED ORDER — PROPOFOL INJ 10 MG/ML IV VIAL
0 refills | Status: DC
Start: 2017-01-13 — End: 2017-01-13
  Administered 2017-01-13 (×3): 50 mg via INTRAVENOUS
  Administered 2017-01-13: 16:00:00 150 mg via INTRAVENOUS
  Administered 2017-01-13: 18:00:00 50 mg via INTRAVENOUS
  Administered 2017-01-13: 19:00:00 20 mg via INTRAVENOUS
  Administered 2017-01-13: 16:00:00 50 mg via INTRAVENOUS

## 2017-01-13 MED ORDER — DEXTRAN 70-HYPROMELLOSE (PF) 0.1-0.3 % OP DPET
0 refills | Status: DC
Start: 2017-01-13 — End: 2017-01-13
  Administered 2017-01-13: 16:00:00 2 [drp] via OPHTHALMIC

## 2017-01-13 MED ORDER — EPHEDRINE SULFATE 50 MG/5ML SYR (10 MG/ML) (AN)(OSM)
0 refills | Status: DC
Start: 2017-01-13 — End: 2017-01-13
  Administered 2017-01-13 (×2): 15 mg via INTRAVENOUS

## 2017-01-13 MED ORDER — SODIUM CHLORIDE 0.9 % IV SOLP
INTRAVENOUS | 0 refills | Status: DC
Start: 2017-01-13 — End: 2017-01-13

## 2017-01-13 MED ORDER — ALBUTEROL SULFATE 2.5 MG /3 ML (0.083 %) IN NEBU
2.5 mg | RESPIRATORY_TRACT | 0 refills | Status: DC | PRN
Start: 2017-01-13 — End: 2017-01-13

## 2017-01-13 MED ADMIN — SODIUM CHLORIDE 0.9 % IV SOLP [27838]: 1000 mL | INTRAVENOUS | @ 15:00:00 | Stop: 2017-01-13 | NDC 00338004904

## 2017-01-13 NOTE — Interval H&P Note
History and Physical Update Note    Allergies:  Codeine    Lab/Radiology/Other Diagnostic Tests:  24-hour labs:  No results found for this visit on 01/13/17 (from the past 24 hour(s)).  Point of Care Testing:  (Last 24 hours):         I have examined the patient, and there are no significant changes in their condition, from the previous H&P performed on 12/18/16.    Gwendel Hanson, MD  Pager [phone]    --------------------------------------------------------------------------------------------------------------------------------------------

## 2017-01-13 NOTE — Anesthesia Post-Procedure Evaluation
Post-Anesthesia Evaluation    Name: Alicia Fox      MRN: [id number]     DOB: 1950-12-23     Age: 66 y.o.     Sex: female   __________________________________________________________________________     Procedure Date: 01/13/2017  Procedure: Procedure(s) with comments:  BRONCHOSCOPY - CASE LENGTH 2 HOURS, REQUEST 1015 START, PLEASE CLIP PT IN SDS/PRE-POST, VERAN  BRONCHOSCOPY WITH TRANSBRONCHIAL BIOPSY  BRONCHOSCOPY WITH ENDOBRONCHIAL ULTRASOUND GUIDED TRANSTRACHEAL/ TRANSBRONCHIAL SAMPLING - 3 OR MORE MEDIASTINAL/ HILAR LYMPH NODE STATIONS/ STRUCTURE - RIGID  BRONCHOSCOPY WITH IMAGE - GUIDED NAVIGATION - FLEXIBLE      Surgeon: Surgeon(s):  Arrie Eastern, MD  Gwendel Hanson, MD    Post-Anesthesia Vitals  BP: 125/71 (11/12 1500)  Temp: 36.5 C (97.7 F) (11/12 1530)  Pulse: 102 (11/12 1500)  Respirations: 17 PER MINUTE (11/12 1500)  SpO2: 95 % (11/12 1500)  O2 Delivery: None (Room Air) (11/12 1530)  SpO2 Pulse: 102 (11/12 1500)      Post Anesthesia Evaluation Note    Evaluation location: Pre/Post  Patient participation: recovered; patient participated in evaluation  Level of consciousness: alert    Pain score: 0  Pain management: adequate    Hydration: normovolemia  Temperature: 36.0C - 38.4C  Airway patency: adequate    Perioperative Events  Perioperative events:  no       Post-op nausea and vomiting: no PONV    Postoperative Status  Cardiovascular status: hemodynamically stable  Respiratory status: spontaneous ventilation  Follow-up needed: none        Perioperative Events  Perioperative Event: No  Emergency Case Activation: No

## 2017-01-14 LAB — CELL COUNT W/DIFF-FLUIDS
Lab: 1 %
Lab: 107 /uL
Lab: 36 %
Lab: 4 %
Lab: 59 %
Lab: 99 /uL

## 2017-01-15 ENCOUNTER — Encounter: Admit: 2017-01-15 | Discharge: 2017-01-15 | Payer: MEDICARE

## 2017-01-15 DIAGNOSIS — C801 Malignant (primary) neoplasm, unspecified: ICD-10-CM

## 2017-01-15 DIAGNOSIS — J189 Pneumonia, unspecified organism: ICD-10-CM

## 2017-01-15 DIAGNOSIS — G5603 Carpal tunnel syndrome, bilateral upper limbs: ICD-10-CM

## 2017-01-15 DIAGNOSIS — F99 Mental disorder, not otherwise specified: ICD-10-CM

## 2017-01-15 DIAGNOSIS — I10 Essential (primary) hypertension: Principal | ICD-10-CM

## 2017-01-15 DIAGNOSIS — E785 Hyperlipidemia, unspecified: ICD-10-CM

## 2017-01-15 DIAGNOSIS — M1990 Unspecified osteoarthritis, unspecified site: ICD-10-CM

## 2017-01-15 DIAGNOSIS — K929 Disease of digestive system, unspecified: ICD-10-CM

## 2017-01-15 LAB — CULTURE-RESP,LOWER W/SENSITIVITY: Lab: LOW

## 2017-01-15 MED ORDER — DOXYCYCLINE HYCLATE 100 MG PO CAP
100 mg | ORAL_CAPSULE | Freq: Two times a day (BID) | ORAL | 0 refills | 8.00000 days | Status: AC
Start: 2017-01-15 — End: 2017-02-18

## 2017-01-15 NOTE — Telephone Encounter
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  Will plan to present on multidisciplinary thoracic oncology conference for evaluation of surgical resection of the tumor.  Additionally, she appears to have some minimal to mild ongoing hemorrhage as she is complaining of intermittent scant hemoptysis.  She is also complaining of a fairly significant cough post bronchoscopy.  It is certainly possible that she is suffering from an acute bronchitis post bronchoscopy so have prescribed doxycycline 100 mg twice daily times 7 days.  Also relayed to her that should she feel that her shortness of breath and cough is bad enough that I think it may be reasonable to present to the emergency room here at [facility] for repeat chest imaging and potential admission to the hospital for repeat bronchoscopy and intervention to cease possible endobronchial bleeding from the carcinoid tumor.  She voiced understanding and was appreciative of the phone call.

## 2017-01-16 ENCOUNTER — Encounter: Admit: 2017-01-16 | Discharge: 2017-01-16 | Payer: MEDICARE

## 2017-01-17 ENCOUNTER — Emergency Department: Admit: 2017-01-17 | Discharge: 2017-01-17 | Payer: MEDICARE

## 2017-01-17 ENCOUNTER — Encounter: Admit: 2017-01-17 | Discharge: 2017-01-17 | Payer: MEDICARE

## 2017-01-17 ENCOUNTER — Emergency Department: Admit: 2017-01-17 | Discharge: 2017-01-17 | Disposition: A | Discharge status: Home / Self Care | Payer: MEDICARE

## 2017-01-17 DIAGNOSIS — R0602 Shortness of breath: ICD-10-CM

## 2017-01-17 DIAGNOSIS — G5603 Carpal tunnel syndrome, bilateral upper limbs: ICD-10-CM

## 2017-01-17 DIAGNOSIS — I10 Essential (primary) hypertension: Principal | ICD-10-CM

## 2017-01-17 DIAGNOSIS — R042 Hemoptysis: Principal | ICD-10-CM

## 2017-01-17 DIAGNOSIS — C801 Malignant (primary) neoplasm, unspecified: ICD-10-CM

## 2017-01-17 DIAGNOSIS — F99 Mental disorder, not otherwise specified: ICD-10-CM

## 2017-01-17 DIAGNOSIS — J189 Pneumonia, unspecified organism: ICD-10-CM

## 2017-01-17 DIAGNOSIS — E785 Hyperlipidemia, unspecified: ICD-10-CM

## 2017-01-17 DIAGNOSIS — R0789 Other chest pain: ICD-10-CM

## 2017-01-17 DIAGNOSIS — K929 Disease of digestive system, unspecified: ICD-10-CM

## 2017-01-17 DIAGNOSIS — M1990 Unspecified osteoarthritis, unspecified site: ICD-10-CM

## 2017-01-17 LAB — INFLUENZA A/B AG (RAPID TEST): Lab: NEGATIVE

## 2017-01-17 LAB — COMPREHENSIVE METABOLIC PANEL
Lab: 0.4 mg/dL (ref 0.3–1.2)
Lab: 0.5 mg/dL (ref 0.4–1.00)
Lab: 10 mg/dL — ABNORMAL HIGH (ref 8.5–10.6)
Lab: 111 U/L — ABNORMAL HIGH (ref 25–110)
Lab: 137 MMOL/L — ABNORMAL HIGH (ref 137–147)
Lab: 14 U/L (ref 7–40)
Lab: 15 U/L (ref 7–56)
Lab: 17 mg/dL — ABNORMAL LOW (ref 7–25)
Lab: 30 MMOL/L (ref 21–30)
Lab: 4 g/dL (ref 3.5–5.0)
Lab: 6 10*3/uL (ref 3–12)
Lab: 7.1 g/dL (ref 6.0–8.0)
Lab: >60 mL/min (ref >60)

## 2017-01-17 LAB — PTT (APTT): Lab: 26 s (ref 20.0–36.0)

## 2017-01-17 LAB — CBC AND DIFF: Lab: 8 10*3/uL (ref 4.5–11.0)

## 2017-01-17 LAB — D-DIMER: Lab: 593 ng{FEU}/mL — ABNORMAL HIGH (ref <500)

## 2017-01-17 LAB — PROTIME INR (PT): Lab: 1 MMOL/L (ref 0.8–1.2)

## 2017-01-17 NOTE — ED Notes
Discharge instructions discussed and given to the patient. Patient verbalizes understanding and has no questions at this time. Patient discharges, ambulatory and stable, accompanied by family.

## 2017-01-17 NOTE — ED Notes
Flu swab sent to lab. Pt resting on cart, VSS, denies needs at this time.

## 2017-01-17 NOTE — Progress Notes
[facility]  CANCER CONFERENCE  Thoracic Oncology    Tumor Conference Date:  01/16/17  Patient: Alicia Fox      Med Rec #: [id number]    DOB: 01/28/1951       Presenting Physician:  Dr. Windell Moment, for Dr. Theresia Lo  Pulmonologist:  Dr. Theresia Lo    Discussion:  Prospective discussion    Reason for Presentation:  clarify pathology, discuss tx options and examine films    Clinical History/Significant PMHx:  She was referred for an abnormal CT scan of the chest.  She states that she has had a cough for a couple of months.  She was on a couple rounds of antibiotics as well as steroids but the cough did not improve.  She was a smoker but quit several years ago - she smoked about 1 ppd.  She has never been diagnosed with any other lung problems.  Denies h/o of childhood asthma, recurrent pneumonias.  She is more short of breath than she was several months ago. Underwent bronchoscopy at [facility]. Path showed carcinoid tumor in LLL lung mass.    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  Will plan to present on multidisciplinary thoracic oncology conference for evaluation of surgical resection of the tumor.    Prior treatment (XRT, surgery, chemotherapy, etc.):  N/A    DIAGNOSIS:  carcinoid tumor    Date of Diagnosis:  01/13/17    Pathology;  Final Diagnosis:     A. Bronchial mucosa and alveolated lung parenchyma, lll mass, biopsy:   Small fragment of carcinoid tumor. See comment. ???     Comment:   Immunostains performed on block A1 demonstrate that the tumor is positive   for CD56, synaptophysin and chromogranin, shows focal weak equivocal   staining with TTF-1 and is negative???for CDX2. No necrosis is appreciated   and the Ki-67 proliferative index is less than 5%, favoring a typical   carcinoid in this small biopsy; however, definite distinction between typical and atypical carcinoid is deferred to more extensive sampling.    PFT's:  Yes    Results of diagnostic procedures/radiological procedures (CT, MRI, PET, FNA):    01/13/17- CT chest, IMPRESSION    1. Stable dominant lobulated nodule in the left lower lobe. This may be a   low-grade neoplasm such as carcinoid. Biopsy is suggested.  2. Several tiny pulmonary nodules are noted which may be scars or   granulomas. A follow-up chest CT in 12 months is recommended to assess for   stability of these nodules. ???  3.Bilateral silicone breast implants are in place. 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  4. No thoracic lymphadenopathy.  5. At least mild coronary artery calcification and small pericardial   Effusion.    01/03/17- PET, IMPRESSION    1. ???Left lower lobe pulmonary nodule demonstrating mild hypermetabolic   activity. This lesion is indeterminate and may represent an indolent   pulmonary neoplasm versus a granuloma. Image guided biopsy of this lesion   is recommended for further evaluation.  2. ???No evidence of regional nodal or distant metastatic disease.      Staging:  Cancer Staging  No matching staging information was found for the patient.    Staging discussed:  Yes    RECOMMENDATIONS:   Chemotherapy:  Radiotherapy:      Surgery: consider CTS referral     Other:      NCCN or other national guidelines discussed:  Yes    Candidate for open clinical trial discussed:  No    Attendance:  Dr. Lafayette Dragon, Dr. Phineas Real, Dr. Dionisio David, Dr. Nile Riggs, Dr. Brunetta Genera, Dr. Lady Gary, Dr. Willaim Bane, Dr. Windell Moment, Dr. Sharion Settler, Dr. Raye Sorrow, Corrie Mckusick APRN, S [place] APRN, Georgiana Shore RN, S Tagen Brethauer RN, Olive Bass RN, B Hedgpeth RN.

## 2017-01-17 NOTE — ED Notes
Pulmonary team paged, Dr. Jon Billings notified that patient is here, he states that he will contact Dr. Charlie Pitter to discuss his plan of care.

## 2017-01-20 ENCOUNTER — Encounter: Admit: 2017-01-20 | Discharge: 2017-01-20 | Payer: MEDICARE

## 2017-01-20 DIAGNOSIS — D3A00 Benign carcinoid tumor of unspecified site: Principal | ICD-10-CM

## 2017-01-21 NOTE — Progress Notes
RN called to check on patient condition.Patient states she was put on doxycycline last Wednesday by Dr. Charlie Pitter after bronch. She went to ER 01/17/17 for hemoptysis and was discharged same day and was informed to follow up with Dr. Charlie Pitter.    Pt states she still feels very soa at rest and has labored breathing that worsens with exertion. She states her breathing has improved some since last Wednesday but she remains soa and still has hacking dry cough multiple times a day. She states she produces sputum at times that is sometimes blood streaked but the amount of blood has decreased tremendously.  Pt reports night sweats as well.     She denies fevers.    Pt with question of "what is next step?" She states she is supposed to get carpal tunnel surgery soon but is waiting to schedule until she has a clear plan regarding tumor.   Pt was informed per Dr. Charlie Pitter' note, patient is to be discussed at oncology tumor conference.    Pt would like to know if Dr. Charlie Pitter wants her to do anything further regarding significant cough and persisting soa.    Spoke with Dr. Charlie Pitter who states patient should complete doxycycline and continue to monitor as she does state her breathing has improved. Referral placed to CTS for surgical resection of carcinoid tumor.    Called patient to inform.    Colin Broach, Therapist, sports

## 2017-01-27 NOTE — Progress Notes
Tivis Ringer, RN  Arrie Eastern, MD Cc: Colin Broach, RN            Dr. Charlie Pitter,   I've spoke with Ms. Gut and we have her scheduled to see Dr. Leeann Must on Tuesday, 02/18/17. Discussed timeframe with patient and she is comfortable with appointment date. Thank you,  Joann      Pt scheduled with DR. Nicholes Mango 02/18/17.  Colin Broach, Therapist, sports

## 2017-02-17 ENCOUNTER — Encounter: Admit: 2017-02-17 | Discharge: 2017-02-17 | Payer: MEDICARE

## 2017-02-17 LAB — CULTURE-FUNGAL,OTHER

## 2017-02-17 MED ORDER — LOSARTAN 100 MG PO TAB
ORAL_TABLET | Freq: Two times a day (BID) | ORAL | 3 refills | 30.00000 days | Status: AC
Start: 2017-02-17 — End: 2018-02-19

## 2017-02-17 NOTE — Progress Notes
Date of Service: 02/18/2017    Name: Alicia Fox          DOB: 1950-07-20          MRN: [id number]    Referring Provider: Audie Box  PCP: Orson Gear    Chief Complaint   Patient presents with   ??? 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  Ms. Fenter presented to an OSH with cough for a few months, no improvement with two rounds of antibiotics and steroids.  CT imaging on 12/16/16 demonstrated a left lower lobe pulmonary nodule measuring 2.5 cm.  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    Work up summary:   08/02/16 thallium stress test- This study is probably normal with no evidence of significant myocardial ischemia. Left ventricular systolic function is normal. There are no high risk prognostic indicators present.  The ECG portion of the study is negative for ischemia.- Dr. Avie Arenas, cardiologist     12/18/16 - consultation w/ [place] [facility] Dr. Brown Human    01/03/17 - PET scan  -  LLL pulmonary nodule demonstrating mild hypermetabolic activity; max SUV of 2.4 (index -389, CT image 115).  No evidence of regional nodal or distant metastatic disease.    01/03/17 ??? PFTs: FEV1 ??? 83%, DLCO 78%    01/13/17 - CT chest  -  Stable dominant lobulated nodule in the LLL; measuring up to 2.5 cm on series 2, image 40.  Several tiny pulmonary nodules are noted which may be scars or granulomas. No thoracic lymphadenopathy.    01/13/17 - bronchoscopy/EBUS  Bronchial mucosa and alveolated lung parenchyma, lll mass, biopsy Path:  Small fragment of carcinoid tumor; within comment:  favoring a typical carcinoid in this small biopsy; however, definite distinction between typical and atypical carcinoid is deferred to more extensive sampling    11R- scant polymorphous population of lymphocytes.  No carcinoma identified in the sampled specimen  BAL- Negative for malignant cells. The Grocott stain is negative for Pneumocystis and other fungal organisms.    01/16/17 - presented at thoracic tumor conference      Recommendation: thoracic surgery referral to discuss resection     She is here today to discuss possible surgical interventions.     Overall her symptoms have included shortness of breath, weight gain of 25-30 lbs in the last several months, loss of energy by the end of the day, appetite good.  She has been back an forth caring for elderly parents and a sick brother in [place]. She denies chest pain, chest pressure/discomfort, chills, fever, productive cough, weight loss and wheezing, nausea , vomitting, diarrhea , constipation.    This was all reviewed today by Dr. Particia Nearing and was discussed at length with Alicia Fox.      History   Past Medical History:   Diagnosis Date   ??? Arthritis     lumbar, knees   ??? Cancer (HCC)     breast   ??? Carpal tunnel syndrome on both sides 2018    L > R   ??? Gastrointestinal disorder     gerd at times-under control  with meds   ??? HTN (hypertension)    ??? Hyperlipidemia    ??? Pneumonia     bilateral bacterial   ??? Psychiatric illness     depression     Past Surgical History:   Procedure Laterality Date   ??? HX MASTECTOMY Bilateral 1986    1986-stomberg mastectomy   ??? BREAST BIOPSY  07/1984   ??? MASTECTOMY, PARTIAL Bilateral 12/1984   ??? MASTECTOMY, MODIFIED RADICAL Bilateral 1987    1987-full modified   ??? BREAST SURGERY Bilateral 07/1985    implants   ??? HX DILATION AND CURETTAGE  07/1994   ??? HX HYSTERECTOMY  10/1994    complete   ??? HX ABDOMINOPLASTY  10/1994   ??? HIP SURGERY  02/1995   ??? HX LIPOMA RESECTION  11/1999    outer upper left arm   ??? ANUS SURGERY  01/2010    anal fissure   ??? ANUS SURGERY  01/2010    anal fissure ??? HX LIPOMA RESECTION  02/2010    inside upper right arm   ??? SPINE SURGERY  1993 and 1996    cervical fusion       Family History   Problem Relation Age of Onset   ??? Hypertension Mother    ??? Atrial Fibrillation Mother    ??? Hypertension Father    ??? COPD Father    ??? Heart Failure Father    ??? Kidney Cancer Brother    ??? Cancer-Prostate Brother      Allergies   Allergen Reactions   ??? Codeine HEADACHE       There is no immunization history on file for this patient.      Medications:   ??? amLODIPine (NORVASC) 2.5 mg tablet TAKE 1 TABLET BY MOUTH TWICE DAILY.   ??? aspirin EC 81 mg tablet Take 1 Tab by mouth daily. (Patient not taking: Reported on 02/18/2017)   ??? atorvastatin (LIPITOR) 10 mg tablet Take 10 mg by mouth at bedtime daily.   ??? cetirizine (ZYRTEC) 10 mg tablet Take 10 mg by mouth daily.   ??? cinnamon bark (CINNAMON PO) Take 1 tablet by mouth daily.   ??? Cranberry Extract 500 mg cap Take 1 capsule by mouth daily.   ??? Diphenhydramine-Acetaminophen (TYLENOL PM EXTRA STRENGTH) 25-500 mg tab tablet Take 1 tablet by mouth at bedtime daily.   ??? Folic Acid 800 mcg tab Take 1 tablet by mouth daily.   ??? furosemide (LASIX) 20 mg tablet Take 20 mg by mouth daily as needed.   ??? glucosamine HCl/chondroitin su (GLUCOSAMINE-CHONDROITIN PO) Take 1 tablet by mouth daily.   ??? hydroCHLOROthiazide (HYDRODIURIL) 25 mg tablet TAKE 1 TAB BY MOUTH DAILY.   ??? KRILL OIL-OMEGA-3-DHA-EPA PO Take 350 mg by mouth daily.   ??? levothyroxine (SYNTHROID) 88 mcg tablet Take 88 mcg by mouth daily 30 minutes before breakfast.   ??? losartan(+) (COZAAR) 100 mg tablet TAKE 1 TAB BY MOUTH TWICE DAILY.   ??? pantoprazole DR (PROTONIX) 40 mg tablet Take 40 mg by mouth every 48 hours.   ??? polycarbophil (FIBERCON) 625 mg tablet Take 1,250 mg by mouth twice daily.   ??? TURMERIC ROOT EXTRACT PO Take 1 tablet by mouth daily.   ??? vit C,E-Zn-coppr-lutein-zeaxan (OCUVITE LUTEIN AND ZEAXANTHIN) 60 mg-30 unit- 15 mg-2 mg-6 mg cap Take 1 tablet by mouth daily. I have reviewed the PTA meds and they are correct.    Anti-coagulation: aspirin 81mg  holding  Occupation: retired     Chartered certified accountant  ECOG Performance Status: 1, Restricted in physically strenuous activity but ambulatory and able to carry out work of a light or sedentary nature, e.g., light house work, office work   Pain Scale (0 = No Pain, 10 = Severe Pain): 0  Comments/Interventions: na    Clinical Nutrition Status  PO Intake compared to normal: Normal  Weight loss last 3 months: 0 lbs  Estimated body mass index is 47.17 kg/m??? as calculated from the following:    Height as of this encounter: 1.626 m (5' 4).    Weight as of this encounter: 124.6 kg (274 lb 12.8 oz).  BMI class: Obesity 3 (>40)  Patient demonstrates no malnutrition      ROS   Review of Systems   Constitution: Positive for weakness, night sweats and weight gain.   HENT: Negative.    Eyes: Positive for photophobia.   Cardiovascular: Positive for dyspnea on exertion and leg swelling.   Respiratory: Positive for shortness of breath.    Endocrine: Negative.    Hematologic/Lymphatic: Negative.    Skin: Negative.    Musculoskeletal: Positive for back pain.   Gastrointestinal: Positive for excessive appetite.   Genitourinary: Positive for bladder incontinence.   Psychiatric/Behavioral: Positive for depression. The patient has insomnia.          Review of systems Obtained from patient    Physical Exam   Constitutional: She appears well-developed and well-nourished.   HENT:   Head: Normocephalic.   Cardiovascular: Normal rate and regular rhythm.   Pulmonary/Chest: Breath sounds normal. No respiratory distress. She has no wheezes. She has no rales. She exhibits no tenderness.   Abdominal: Soft. Bowel sounds are normal.   Lymphadenopathy:     She has no cervical adenopathy.   Neurological: She is alert and oriented to person, place, and time.   Skin: Skin is warm and dry.   Psychiatric: She has a normal mood and affect. Her behavior is normal. Judgment and thought content normal.       Objective  Vitals:    02/18/17 0957   BP: 138/82   Pulse: 93   Temp: 36.9 ???C (98.4 ???F)   SpO2: 94%         Primary Diagnosis:   Encounter Diagnoses   Name Primary?   ??? Carcinoid tumor of lung Yes   ??? Cough    ??? Dyspnea on exertion    ??? Class 3 severe obesity due to excess calories without serious comorbidity with body mass index (BMI) of 40.0 to 44.9 in adult [facility])    ??? Essential hypertension          Currently Active Problems:  Patient Active Problem List    Diagnosis Date Noted   ??? 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 Carpal tunnel syndrome of right wrist 10/22/2016   ??? Carpal tunnel syndrome of left wrist 10/09/2016   ??? 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 Hypercalcemia 08/09/2016   ??? Carpal tunnel syndrome, bilateral 08/06/2016   ??? Abnormal CT of the head 08/06/2016   ??? Weight loss 10/31/2015   ??? Cough 08/09/2014   ??? Framingham cardiac risk <10% in next 10 years 08/09/2014   ??? Medication side effects 05/18/2014   ??? Weight gain 04/28/2014   ??? Bilateral edema of lower extremity 04/28/2014   ??? Dyspnea on exertion 04/28/2014   ??? Diastolic heart failure (HCC) 04/28/2014   ??? Hyperlipemia 01/18/2010   ??? Hypertension 07/21/2008   ???  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 Obesity 07/21/2008         Path Results: 01/13/17 - bronchoscopy/EBUS  Bronchial mucosa and alveolated lung parenchyma, lll mass, biopsy Path:  Small fragment of carcinoid tumor; within comment:  favoring a typical carcinoid in this small biopsy; however, definite distinction between typical and atypical carcinoid is deferred to more extensive sampling    11R- scant polymorphous population of lymphocytes.  No carcinoma identified in the sampled specimen  BAL- Negative for malignant cells. The Grocott stain is negative for Pneumocystis and other fungal organisms.    Clinical Staging:  Cancer Staging  No matching staging information was found for the patient. Tumor is malignant       Dr. Particia Nearing has reviewed this patient's most recent procedures and history and does feel that surgery is warranted. She will undergo a left robotic VATs for her left lower lobe carcinoid tumor. The entire procedure was discussed at length including risks, benefits, alternatives and possible complications. Alicia Fox understands, accepts and wishes to proceed. She will need PAT and cardiac clearance prior to surgery and this was ordered today.  She has an appointment with Dr. Avie Arenas, cardiology on Thursday. Thank you very much for allowing [place] to participate in this pleasant patient's care. We will keep you apprised of her progress. Please do not hesitate to contact us with any questions or concerns.  Pt does have social support at home daughter and son in law live with patient.  She also has a sister that will be active in her recovery.    OR Jan 4th    Tad Moore, APRN-NP  Thoracic Surgery  Lung Cancer Screening Program  [phone]    I spent 25 minutes reviewing outside records, treatment plans, imaging, pathology, procedures, procedure notes, and treatment notes in preparation for consultation today, this was non-face to face time.   ???    Assessment and Plan         ATTESTATION  ???  I personally interviewed and examined the patient.  I have reviewed the history, physical, impression and plan outlined by the Nurse Practitioner.     I had the pleasure of seeing Alicia Fox today in thoracic surgery clinic.  As you are aware she is a 66 year old female with biopsy-proven left lower lobe carcinoid tumor.  Patient presents today for surgical evaluation.  Prior to arrival she had pulmonary function test demonstrating an FEV1 of 83% and DLCO of 78%.  Prior PET scan demonstrate a mild PET avidity of this lesion.  This lesion was biopsy-proven secondary to pulmonary bronchoscopy performed by our interventional pulmonary colleagues. Patient denies any hemoptysis, bone pain, headaches, or other constitutional symptoms.  Patient 90s any pain associated with this lesion.  Patient is a non-smoker.  Patient has had a previous stress test which was normal.    On physical exam she is 5 feet 4 inches weight 124.6 kg giving her BMI 47.2.  Pupils equally round and reactive to light.  Extraocular muscles intact.  Neck supple no lymphadenopathy.  Chest clear to auscultation bilaterally.  Cardiovascular regular rate and rhythm.  Abdomen soft nontender nondistended.  Extremities warm well perfused.  Skin warm dry and intact.  Alert and oriented x3.  Range of motion normal.    After the review the risk, benefits, and alternatives associated with surgical intervention we have agreed to proceed with a robotic assisted left lower lobectomy to be performed on March 07, 2017.  Prior to surgery  she will need to see her preoperative anesthesia screening.  Additionally we will will obtain cardiology clearance as she has a upcoming appointment with her cardiologist.  I appreciate the opportunity to participate in her care.  Please feel free to contact my office with any questions or concerns.    Staff name: Oleh Genin. Ignacia Palma, MD

## 2017-02-18 ENCOUNTER — Encounter: Admit: 2017-02-18 | Discharge: 2017-02-18 | Payer: MEDICARE

## 2017-02-18 ENCOUNTER — Ambulatory Visit: Admit: 2017-02-18 | Discharge: 2017-02-19 | Payer: MEDICARE

## 2017-02-18 DIAGNOSIS — M1990 Unspecified osteoarthritis, unspecified site: ICD-10-CM

## 2017-02-18 DIAGNOSIS — K929 Disease of digestive system, unspecified: ICD-10-CM

## 2017-02-18 DIAGNOSIS — F99 Mental disorder, not otherwise specified: ICD-10-CM

## 2017-02-18 DIAGNOSIS — G5603 Carpal tunnel syndrome, bilateral upper limbs: ICD-10-CM

## 2017-02-18 DIAGNOSIS — E785 Hyperlipidemia, unspecified: ICD-10-CM

## 2017-02-18 DIAGNOSIS — I10 Essential (primary) hypertension: Principal | ICD-10-CM

## 2017-02-18 DIAGNOSIS — J189 Pneumonia, unspecified organism: ICD-10-CM

## 2017-02-18 DIAGNOSIS — C801 Malignant (primary) neoplasm, unspecified: ICD-10-CM

## 2017-02-18 DIAGNOSIS — D3A090 Benign carcinoid tumor of the bronchus and lung: ICD-10-CM

## 2017-02-19 DIAGNOSIS — D3A00 Benign carcinoid tumor of unspecified site: Principal | ICD-10-CM

## 2017-02-20 ENCOUNTER — Ambulatory Visit: Admit: 2017-02-20 | Discharge: 2017-02-21 | Payer: MEDICARE

## 2017-02-20 ENCOUNTER — Encounter: Admit: 2017-02-20 | Discharge: 2017-02-20 | Payer: MEDICARE

## 2017-02-20 DIAGNOSIS — C801 Malignant (primary) neoplasm, unspecified: ICD-10-CM

## 2017-02-20 DIAGNOSIS — R634 Abnormal weight loss: ICD-10-CM

## 2017-02-20 DIAGNOSIS — I10 Essential (primary) hypertension: Principal | ICD-10-CM

## 2017-02-20 DIAGNOSIS — R0600 Dyspnea, unspecified: ICD-10-CM

## 2017-02-20 DIAGNOSIS — E785 Hyperlipidemia, unspecified: ICD-10-CM

## 2017-02-20 DIAGNOSIS — M1990 Unspecified osteoarthritis, unspecified site: ICD-10-CM

## 2017-02-20 DIAGNOSIS — F99 Mental disorder, not otherwise specified: ICD-10-CM

## 2017-02-20 DIAGNOSIS — G5603 Carpal tunnel syndrome, bilateral upper limbs: ICD-10-CM

## 2017-02-20 DIAGNOSIS — I5032 Chronic diastolic (congestive) heart failure: Principal | ICD-10-CM

## 2017-02-20 DIAGNOSIS — E782 Mixed hyperlipidemia: ICD-10-CM

## 2017-02-20 DIAGNOSIS — J189 Pneumonia, unspecified organism: ICD-10-CM

## 2017-02-20 DIAGNOSIS — K929 Disease of digestive system, unspecified: ICD-10-CM

## 2017-02-20 NOTE — Progress Notes
Date of Service: 02/20/2017    Alicia Fox is a 66 y.o. female.       HPI     This is a very nice patient that we have been following up in our office for several years regarding hypertension, hyperlipidemia, and risk factors for coronary artery disease.    Today patient does admit that she has been having symptoms of bronchitis that lasted several weeks.  patient was coughing, and I believe she was also prescribed antibiotics.  She underwent evaluation at [facility], a bronchoscopy was performed on 01/13/2017 and she was found to have a carcinoid tumor.  It is anticipated that patient will undergo surgical intervention on on March 07, 2017.  From a cardiac standpoint this patient remains stable, she has not had any symptoms of chest pain or heart palpitations.    On August 03, 2010 patient was evaluated with an echocardiogram, this revealed normal left ventricular systolic function, normal pulmonary artery pressure and no valvular abnormalities.  A nuclear stress test was also performed on August 02, 2016, the tomographic pattern was negative for ischemia.         Vitals:    02/20/17 1055   BP: 132/78   Pulse: 86   Weight: 123.8 kg (273 lb)   Height: 1.626 m (5' 4)     Body mass index is 46.86 kg/m???.     Past Medical History  Patient Active Problem List    Diagnosis Date Noted   ??? 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 Carpal tunnel syndrome of right wrist 10/22/2016   ??? Carpal tunnel syndrome of left wrist 10/09/2016     Added automatically from request for surgery [id number]     ??? 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 Hypercalcemia 08/09/2016   ??? Carpal tunnel syndrome, bilateral 08/06/2016   ??? Abnormal CT of the head 08/06/2016   ??? Weight loss 10/31/2015   ??? Cough 08/09/2014   ??? Framingham cardiac risk <10% in next 10 years 08/09/2014   ??? Medication side effects 05/18/2014   ??? Weight gain 04/28/2014   ??? Bilateral edema of lower extremity 04/28/2014 ??? Dyspnea on exertion 04/28/2014   ??? Diastolic heart failure (HCC) 04/28/2014   ??? Hyperlipemia 01/18/2010   ??? Hypertension 07/21/2008   ??? 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 Obesity 07/21/2008         Review of Systems   Constitution: Positive for night sweats and weight gain.   HENT: Negative.    Eyes: Positive for photophobia.   Cardiovascular: Negative.    Respiratory: Positive for shortness of breath.    Endocrine: Negative.    Hematologic/Lymphatic: Negative.    Skin: Negative.    Musculoskeletal: Negative.    Gastrointestinal: Positive for excessive appetite.   Genitourinary: Positive for bladder incontinence.   Neurological: Negative.    Psychiatric/Behavioral: Positive for depression.   Allergic/Immunologic: Negative.        Physical Exam  General Appearance: obese  Skin: warm, moist, no ulcers or xanthomas  Eyes: conjunctivae and lids normal, pupils are equal and round  Lips & Oral Mucosa: no pallor or cyanosis  Neck Veins: neck veins are flat, neck veins are not distended  Chest Inspection: chest is normal in appearance  Respiratory Effort: breathing comfortably, no respiratory distress  Auscultation/Percussion: lungs clear to auscultation, no rales or rhonchi, no wheezing  Cardiac Rhythm: regular rhythm and normal rate  Cardiac Auscultation: S1, S2 normal, no  rub, no gallop  Murmurs: no murmur  Carotid Arteries: normal carotid upstroke bilaterally, no bruit  Abdominal aorta: could not be examined due to obese adomen  Lower Extremity Edema: no lower extremity edema  Abdominal Exam: soft, non-tender, no masses, bowel sounds normal  Liver & Spleen: no organomegaly  Language and Memory: patient responsive and seems to comprehend information  Neurologic Exam: neurological assessment grossly intact        Cardiovascular Studies  Twelve-lead EKG demonstrated normal sinus rhythm, no ST segment T wave changes    Problems Addressed Today  Encounter Diagnoses   Name Primary?   ??? Chronic diastolic heart failure (HCC) ??? Mixed hyperlipidemia    ??? Essential hypertension    ??? Class 3 severe obesity due to excess calories without serious comorbidity with body mass index (BMI) of 40.0 to 44.9 in adult [facility])    ??? Dyspnea on exertion    ??? Weight loss        Assessment and Plan     In summary: This is a 66 year old white female with a history of hypertension, hyperlipidemia, obesity (BMI is 46.86), no documented history of coronary artery disease, recent evaluation on August 02, 2016 was overall unremarkable and did not demonstrate ischemia, patient has a recent diagnosis of left lung carcinoid tumor and it is anticipated that she will undergo surgical intervention on March 07, 2017.    In the light of the above, I recommend the following:    1. Continue all current cardiac medications  2. From a cardiac standpoint this patient is low risk to undergo general anesthesia and the planned surgical intervention  3. If there are any  concerns regarding management of this patient's cardiac issues during hospitalization at [facility] feel free to contact our department  4. Follow-up office visit in 6 months.             Current Medications (including today's revisions)  ??? amLODIPine (NORVASC) 2.5 mg tablet TAKE 1 TABLET BY MOUTH TWICE DAILY.   ??? aspirin EC 81 mg tablet Take 1 Tab by mouth daily. (Patient not taking: Reported on 02/18/2017)   ??? atorvastatin (LIPITOR) 10 mg tablet Take 10 mg by mouth at bedtime daily.   ??? cetirizine (ZYRTEC) 10 mg tablet Take 10 mg by mouth daily.   ??? cinnamon bark (CINNAMON PO) Take 1 tablet by mouth daily.   ??? Cranberry Extract 500 mg cap Take 1 capsule by mouth daily.   ??? Diphenhydramine-Acetaminophen (TYLENOL PM EXTRA STRENGTH) 25-500 mg tab tablet Take 1 tablet by mouth at bedtime daily.   ??? Folic Acid 800 mcg tab Take 1 tablet by mouth daily.   ??? furosemide (LASIX) 20 mg tablet Take 20 mg by mouth daily as needed.   ??? glucosamine HCl/chondroitin su (GLUCOSAMINE-CHONDROITIN PO) Take 1 tablet by mouth daily.   ??? hydroCHLOROthiazide (HYDRODIURIL) 25 mg tablet TAKE 1 TAB BY MOUTH DAILY.   ??? KRILL OIL-OMEGA-3-DHA-EPA PO Take 350 mg by mouth daily.   ??? levothyroxine (SYNTHROID) 88 mcg tablet Take 88 mcg by mouth daily 30 minutes before breakfast.   ??? losartan(+) (COZAAR) 100 mg tablet TAKE 1 TAB BY MOUTH TWICE DAILY.   ??? pantoprazole DR (PROTONIX) 40 mg tablet Take 40 mg by mouth. Monday, Wednesday, fridays   ??? polycarbophil (FIBERCON) 625 mg tablet Take 1,250 mg by mouth twice daily.   ??? TURMERIC ROOT EXTRACT PO Take 1 tablet by mouth daily.   ??? vit C,E-Zn-coppr-lutein-zeaxan (OCUVITE LUTEIN AND ZEAXANTHIN)  60 mg-30 unit- 15 mg-2 mg-6 mg cap Take 1 tablet by mouth daily.

## 2017-02-26 ENCOUNTER — Encounter: Admit: 2017-02-26 | Discharge: 2017-02-26 | Payer: MEDICARE

## 2017-02-26 DIAGNOSIS — D3A090 Benign carcinoid tumor of the bronchus and lung: Principal | ICD-10-CM

## 2017-02-27 ENCOUNTER — Ambulatory Visit: Admit: 2017-02-27 | Discharge: 2017-02-27 | Payer: MEDICARE

## 2017-02-27 ENCOUNTER — Encounter: Admit: 2017-02-27 | Discharge: 2017-02-27 | Payer: MEDICARE

## 2017-02-27 ENCOUNTER — Ambulatory Visit: Admit: 2017-02-27 | Discharge: 2017-02-28 | Payer: MEDICARE

## 2017-02-27 DIAGNOSIS — J189 Pneumonia, unspecified organism: ICD-10-CM

## 2017-02-27 DIAGNOSIS — E785 Hyperlipidemia, unspecified: ICD-10-CM

## 2017-02-27 DIAGNOSIS — E079 Disorder of thyroid, unspecified: ICD-10-CM

## 2017-02-27 DIAGNOSIS — C801 Malignant (primary) neoplasm, unspecified: ICD-10-CM

## 2017-02-27 DIAGNOSIS — M1990 Unspecified osteoarthritis, unspecified site: ICD-10-CM

## 2017-02-27 DIAGNOSIS — D3A090 Benign carcinoid tumor of the bronchus and lung: Principal | ICD-10-CM

## 2017-02-27 DIAGNOSIS — G5603 Carpal tunnel syndrome, bilateral upper limbs: ICD-10-CM

## 2017-02-27 DIAGNOSIS — F99 Mental disorder, not otherwise specified: ICD-10-CM

## 2017-02-27 DIAGNOSIS — K929 Disease of digestive system, unspecified: ICD-10-CM

## 2017-02-27 DIAGNOSIS — I10 Essential (primary) hypertension: Principal | ICD-10-CM

## 2017-02-27 LAB — COMPREHENSIVE METABOLIC PANEL
Lab: 0.4 mg/dL (ref 0.3–1.2)
Lab: 0.5 mg/dL — ABNORMAL LOW (ref 0.4–1.00)
Lab: 10 mg/dL — ABNORMAL HIGH (ref 8.5–10.6)
Lab: 138 MMOL/L — ABNORMAL HIGH (ref 137–147)
Lab: 160 mg/dL — ABNORMAL HIGH (ref 70–100)
Lab: 20 mg/dL — ABNORMAL LOW (ref 7–25)
Lab: 4.2 g/dL (ref 3.5–5.0)
Lab: 7 g/dL (ref 6.0–8.0)
Lab: >60 mL/min (ref >60)
Lab: >60 mL/min (ref >60)

## 2017-02-27 LAB — PROTIME INR (PT): Lab: 0.9 MMOL/L (ref 0.8–1.2)

## 2017-02-27 LAB — CBC: Lab: 8.2 10*3/uL (ref 4.5–11.0)

## 2017-02-27 LAB — PTT (APTT): Lab: 27 s (ref 24.0–36.5)

## 2017-02-27 NOTE — Progress Notes
Reviewed pre op instructions with patient for surgery 1/4.  Instructions included arrival time (0930)/location, NPO at [facility], CHG 4 % shower x 2 and plan of care.  Patient verbalized understanding.

## 2017-02-27 NOTE — Progress Notes
Pt denies any infected wounds or teeth at this time.

## 2017-02-28 DIAGNOSIS — D3A090 Benign carcinoid tumor of the bronchus and lung: Principal | ICD-10-CM

## 2017-03-03 LAB — CULTURE-TB (AFB)

## 2017-03-05 NOTE — H&P (View-Only)
This is a copy from office visit on 02/18/2017 with Alicia Moore, APRN and Alicia Nearing, MD.           [] Hide copied text    [] Hover for details      Date of Service: 02/18/2017  ???  Name: Alicia Fox          DOB: 02-22-1951          MRN: [id number]  ???  Referring Provider: Audie Fox  PCP: Alicia Fox  ???       Chief Complaint   Patient presents with   ??? New Patient   ??? ??? 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  ???  History of Present Illness  Alicia Fox is a 67 y.o. 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  Alicia Fox presented to an OSH with cough for a few months, no improvement with two rounds of antibiotics and steroids.  CT imaging on 12/16/16 demonstrated a left lower lobe pulmonary nodule measuring 2.5 cm.  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  ???  Work up summary:   08/02/16 thallium stress test- This study is probably normal with no evidence of significant myocardial ischemia. Left ventricular systolic function is normal. There are no high risk prognostic indicators present. ???The ECG portion of the study is negative for ischemia.- Dr. Avie Fox, cardiologist   ???  12/18/16 - consultation w/ [facility] Dr. Brown Fox  ???  01/03/17 - PET scan ???- ???LLL pulmonary nodule demonstrating mild hypermetabolic activity; max SUV of 2.4 (index -389, CT image 115).  No evidence of regional nodal or distant metastatic disease.  ???  01/03/17 ??? PFTs: FEV1 ??? 83%, DLCO 78%  ???  01/13/17 - CT chest ???- ???Stable dominant lobulated nodule in the LLL; measuring up to 2.5 cm on series 2, image 40.  Several tiny pulmonary nodules are noted which may be scars or granulomas. No thoracic lymphadenopathy.  ???  01/13/17 - bronchoscopy/EBUS Bronchial mucosa and alveolated lung parenchyma, lll mass, biopsy Path: ???Small fragment of carcinoid tumor; within comment:??????favoring a typical carcinoid in this small biopsy; however, definite distinction between typical and atypical carcinoid is deferred to more extensive sampling  ???  11R- scant polymorphous population of lymphocytes.  No carcinoma identified in the sampled specimen  BAL- Negative for malignant cells. The Grocott stain is???negative for Pneumocystis and other fungal organisms.  ???  01/16/17 - presented at thoracic tumor conference   ?????????Recommendation: thoracic surgery referral to discuss resection   ???  She is here today to discuss possible surgical interventions.   ???  Overall her symptoms have included shortness of breath, weight gain of 25-30 lbs in the last several months, loss of energy by the end of the day, appetite good.  She has been back an forth caring for elderly parents and a sick brother in [place]. She denies chest pain, chest pressure/discomfort, chills, fever, productive cough, weight loss and wheezing, nausea , vomitting, diarrhea , constipation.  ???  This was all reviewed today by Dr. Particia Fox and was discussed at length with Alicia Fox.  ???  ???  History        Past Medical History:   Diagnosis Date   ??? Arthritis ???   ??? lumbar, knees   ???  Cancer (HCC) ???   ??? breast   ??? Carpal tunnel syndrome on both sides 2018   ??? L > R   ??? Gastrointestinal disorder ???   ??? gerd at times-under control with meds   ??? HTN (hypertension) ???   ??? Hyperlipidemia ???   ??? Pneumonia ???   ??? bilateral bacterial   ??? Psychiatric illness ???   ??? depression   ???        Past Surgical History:   Procedure Laterality Date   ??? HX MASTECTOMY Bilateral 1986   ??? 1986-stomberg mastectomy   ??? BREAST BIOPSY ??? 07/1984   ??? MASTECTOMY, PARTIAL Bilateral 12/1984   ??? MASTECTOMY, MODIFIED RADICAL Bilateral 1987   ??? 1987-full modified   ??? BREAST SURGERY Bilateral 07/1985   ??? implants   ??? HX DILATION AND CURETTAGE ??? 07/1994 ??? HX HYSTERECTOMY ??? 10/1994   ??? complete   ??? HX ABDOMINOPLASTY ??? 10/1994   ??? HIP SURGERY ??? 02/1995   ??? HX LIPOMA RESECTION ??? 11/1999   ??? outer upper left arm   ??? ANUS SURGERY ??? 01/2010   ??? anal fissure   ??? ANUS SURGERY ??? 01/2010   ??? anal fissure    ??? HX LIPOMA RESECTION ??? 02/2010   ??? inside upper right arm   ??? SPINE SURGERY ??? 1993 and 1996   ??? cervical fusion   ???  ???        Family History   Problem Relation Age of Onset   ??? Hypertension Mother ???   ??? Atrial Fibrillation Mother ???   ??? Hypertension Father ???   ??? COPD Father ???   ??? Heart Failure Father ???   ??? Kidney Cancer Brother ???   ??? Cancer-Prostate Brother ???   ???       Allergies   Allergen Reactions   ??? Codeine HEADACHE   ???  ???  There is no immunization history on file for this patient.  ???  ???  Medications:   ??? amLODIPine (NORVASC) 2.5 mg tablet TAKE 1 TABLET BY MOUTH TWICE DAILY.   ??? aspirin EC 81 mg tablet Take 1 Tab by mouth daily. (Patient not taking: Reported on 02/18/2017)   ??? atorvastatin (LIPITOR) 10 mg tablet Take 10 mg by mouth at bedtime daily.   ??? cetirizine (ZYRTEC) 10 mg tablet Take 10 mg by mouth daily.   ??? cinnamon bark (CINNAMON PO) Take 1 tablet by mouth daily.   ??? Cranberry Extract 500 mg cap Take 1 capsule by mouth daily.   ??? Diphenhydramine-Acetaminophen (TYLENOL PM EXTRA STRENGTH) 25-500 mg tab tablet Take 1 tablet by mouth at bedtime daily.   ??? Folic Acid 800 mcg tab Take 1 tablet by mouth daily.   ??? furosemide (LASIX) 20 mg tablet Take 20 mg by mouth daily as needed.   ??? glucosamine HCl/chondroitin su (GLUCOSAMINE-CHONDROITIN PO) Take 1 tablet by mouth daily.   ??? hydroCHLOROthiazide (HYDRODIURIL) 25 mg tablet TAKE 1 TAB BY MOUTH DAILY.   ??? KRILL OIL-OMEGA-3-DHA-EPA PO Take 350 mg by mouth daily.   ??? levothyroxine (SYNTHROID) 88 mcg tablet Take 88 mcg by mouth daily 30 minutes before breakfast.   ??? losartan(+) (COZAAR) 100 mg tablet TAKE 1 TAB BY MOUTH TWICE DAILY.   ??? pantoprazole DR (PROTONIX) 40 mg tablet Take 40 mg by mouth every 48 hours. ??? polycarbophil (FIBERCON) 625 mg tablet Take 1,250 mg by mouth twice daily.   ??? TURMERIC ROOT EXTRACT PO Take 1 tablet  by mouth daily.   ??? vit C,E-Zn-coppr-lutein-zeaxan (OCUVITE LUTEIN AND ZEAXANTHIN) 60 mg-30 unit- 15 mg-2 mg-6 mg cap Take 1 tablet by mouth daily.   ???  ???  I have reviewed the PTA meds and they are correct.  ???  Anti-coagulation: aspirin 81mg  holding  Occupation: retired   ???  Functional Assessment   ECOG Performance Status: 1, Restricted in physically strenuous activity but ambulatory and able to carry out work of a light or sedentary nature, e.g., light house work, office work   Pain Scale (0 = No Pain, 10 = Severe Pain): 0  Comments/Interventions: na  ???  Clinical Nutrition Status  PO Intake compared to normal: Normal  Weight loss last 3 months: 0 lbs  Estimated body mass index is 47.17 kg/m??? as calculated from the following:    Height as of this encounter: 1.626 m (5' 4).    Weight as of this encounter: 124.6 kg (274 lb 12.8 oz).  BMI class: Obesity 3 (>40)  Patient demonstrates no malnutrition  ???  ???  ROS   Review of Systems???  Constitution: Positive for???weakness,???night sweats???and weight gain.   HENT:???Negative. ???  Eyes: Positive for???photophobia.   Cardiovascular: Positive for???dyspnea on exertion???and leg swelling.   Respiratory: Positive for???shortness of breath. ???  Endocrine:???Negative. ???  Hematologic/Lymphatic:???Negative. ???  Skin:???Negative. ???  Musculoskeletal: Positive for???back pain.   Gastrointestinal: Positive for???excessive appetite.   Genitourinary: Positive for???bladder incontinence.   Psychiatric/Behavioral: Positive for???depression. The patient???has insomnia.??????  ???  ???  ???  Review of systems Obtained from patient  ???  Physical Exam   Constitutional: She appears well-developed and well-nourished.   HENT:   Head: Normocephalic.   Cardiovascular: Normal rate and regular rhythm.   Pulmonary/Chest: Breath sounds normal. No respiratory distress. She has no wheezes. She has no rales. She exhibits no tenderness.   Abdominal: Soft. Bowel sounds are normal.   Lymphadenopathy:     She has no cervical adenopathy.   Neurological: She is alert and oriented to person, place, and time.   Skin: Skin is warm and dry.   Psychiatric: She has a normal mood and affect. Her behavior is normal. Judgment and thought content normal.   ???  ???  Objective      Vitals:   ??? 02/18/17 0957   BP: 138/82   Pulse: 93   Temp: 36.9 ???C (98.4 ???F)   SpO2: 94%   ???  ???  ???  Primary Diagnosis:        Encounter Diagnoses   Name Primary?   ??? Carcinoid tumor of lung Yes   ??? Cough ???   ??? Dyspnea on exertion ???   ??? Class 3 severe obesity due to excess calories without serious comorbidity with body mass index (BMI) of 40.0 to 44.9 in adult [facility]) ???   ??? Essential hypertension ???       ???  Currently Active Problems:       Patient Active Problem List   ??? Diagnosis Date Noted   ??? 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 Carpal tunnel syndrome of right wrist 10/22/2016   ??? Carpal tunnel syndrome of left wrist 10/09/2016   ??? 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 Hypercalcemia 08/09/2016   ??? Carpal tunnel syndrome, bilateral 08/06/2016   ??? Abnormal CT of the head 08/06/2016   ??? Weight loss 10/31/2015   ??? Cough 08/09/2014   ??? Framingham cardiac risk <10% in next 10 years 08/09/2014   ???  Medication side effects 05/18/2014   ??? Weight gain 04/28/2014   ??? Bilateral edema of lower extremity 04/28/2014   ??? Dyspnea on exertion 04/28/2014   ??? Diastolic heart failure (HCC) 04/28/2014   ??? Hyperlipemia 01/18/2010   ??? Hypertension 07/21/2008   ??? 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 Obesity 07/21/2008   ???  ???  ???  Path Results: 01/13/17 - bronchoscopy/EBUS  Bronchial mucosa and alveolated lung parenchyma, lll mass, biopsy Path: ???Small fragment of carcinoid tumor; within comment:??????favoring a typical carcinoid in this small biopsy; however, definite distinction between typical and atypical carcinoid is deferred to more extensive sampling  ???  11R- scant polymorphous population of lymphocytes.  No carcinoma identified in the sampled specimen  BAL- Negative for malignant cells. The Grocott stain is???negative for Pneumocystis and other fungal organisms.  ???  Clinical Staging:  Cancer Staging  No matching staging information was found for the patient.  ???  Tumor is malignant   ???  ???  Dr. Particia Fox has reviewed this patient's most recent procedures and history and does feel that surgery is warranted. She will undergo a left robotic VATs for her left lower lobe carcinoid tumor. The entire procedure was discussed at length including risks, benefits, alternatives and possible complications. Alicia Fox understands, accepts and wishes to proceed. She will need PAT and cardiac clearance prior to surgery and this was ordered today.  She has an appointment with Dr. Avie Fox, cardiology on Thursday. Thank you very much for allowing [place] to participate in this pleasant patient's care. We will keep you apprised of her progress. Please do not hesitate to contact us with any questions or concerns.  Pt does have social support at home daughter and son in law live with patient.  She also has a sister that will be active in her recovery.  ???  OR Jan 4th  ???  Alicia Moore, APRN-NP  Thoracic Surgery  Lung Cancer Screening Program  [phone]  ???  I spent 25 minutes reviewing outside records, treatment plans, imaging, pathology, procedures, procedure notes, and treatment notes in preparation for consultation today, this was non-face to face time.???  ???  ???  Assessment and Plan  ???  ???  ATTESTATION  ???  I personally interviewed and examined the patient. ???I have reviewed the history, physical, impression and plan outlined by the Nurse Practitioner.   ???  I had the pleasure of seeing Ms. Ohr today in thoracic surgery clinic.  As you are aware she is a 67 year old female with biopsy-proven left lower lobe carcinoid tumor.  Patient presents today for surgical evaluation.  Prior to arrival she had pulmonary function test demonstrating an FEV1 of 83% and DLCO of 78%.  Prior PET scan demonstrate a mild PET avidity of this lesion.  This lesion was biopsy-proven secondary to pulmonary bronchoscopy performed by our interventional pulmonary colleagues.  Patient denies any hemoptysis, bone pain, headaches, or other constitutional symptoms.  Patient 90s any pain associated with this lesion.  Patient is a non-smoker.  Patient has had a previous stress test which was normal.  ???  On physical exam she is 5 feet 4 inches weight 124.6 kg giving her BMI 47.2.  Pupils equally round and reactive to light.  Extraocular muscles intact.  Neck supple no lymphadenopathy.  Chest clear to auscultation bilaterally.  Cardiovascular regular rate and rhythm.  Abdomen soft nontender nondistended.  Extremities warm well perfused.  Skin warm dry and intact.  Alert and oriented x3.  Range of motion normal.  ???  After the review the risk, benefits, and alternatives associated with surgical intervention we have agreed to proceed with a robotic assisted left lower lobectomy to be performed on March 07, 2017.  Prior to surgery she will need to see her preoperative anesthesia screening.  Additionally we will will obtain cardiology clearance as she has a upcoming appointment with her cardiologist.  I appreciate the opportunity to participate in her care.  Please feel free to contact my office with any questions or concerns.  ???  Staff name: Oleh Genin. Nagji, MD  ???  ???  ???  ???  ???  ???  ???  ???          Electronically signed by Alicia Moore, APRN-NP at 02/19/17 1235

## 2017-03-20 NOTE — Progress Notes
Updated patient she is first case for surgery 1/18 with an arrival time 27.  Patient verbalized understanding and was very concerned with weather conditions for tomorrow.  Gave patient CVPP # and CTS after hour number if she could not make it to surgery due to weather.

## 2017-03-21 ENCOUNTER — Encounter: Admit: 2017-03-21 | Discharge: 2017-03-21 | Payer: MEDICARE

## 2017-03-21 ENCOUNTER — Inpatient Hospital Stay
Admit: 2017-03-21 | Discharge: 2017-03-25 | Disposition: A | Discharge status: Home / Self Care | Payer: MEDICARE | Attending: Student in an Organized Health Care Education/Training Program | Admitting: Student in an Organized Health Care Education/Training Program

## 2017-03-21 DIAGNOSIS — M1990 Unspecified osteoarthritis, unspecified site: ICD-10-CM

## 2017-03-21 DIAGNOSIS — J189 Pneumonia, unspecified organism: ICD-10-CM

## 2017-03-21 DIAGNOSIS — I10 Essential (primary) hypertension: Principal | ICD-10-CM

## 2017-03-21 DIAGNOSIS — K929 Disease of digestive system, unspecified: ICD-10-CM

## 2017-03-21 DIAGNOSIS — C801 Malignant (primary) neoplasm, unspecified: ICD-10-CM

## 2017-03-21 DIAGNOSIS — E785 Hyperlipidemia, unspecified: ICD-10-CM

## 2017-03-21 DIAGNOSIS — E079 Disorder of thyroid, unspecified: ICD-10-CM

## 2017-03-21 DIAGNOSIS — G5603 Carpal tunnel syndrome, bilateral upper limbs: ICD-10-CM

## 2017-03-21 DIAGNOSIS — F99 Mental disorder, not otherwise specified: ICD-10-CM

## 2017-03-21 LAB — BASIC METABOLIC PANEL
Lab: 0.6 mg/dL (ref 0.4–1.00)
Lab: 104 MMOL/L — ABNORMAL LOW (ref 98–110)
Lab: 139 MMOL/L (ref 137–147)
Lab: 20 mg/dL (ref 7–25)
Lab: 237 mg/dL — ABNORMAL HIGH (ref 70–100)
Lab: 29 MMOL/L — ABNORMAL LOW (ref 21–30)
Lab: 3.3 MMOL/L — ABNORMAL LOW (ref 3.5–5.1)
Lab: 6 pg — ABNORMAL LOW (ref 3–12)
Lab: 9.6 mg/dL (ref 8.5–10.6)
Lab: >60 mL/min (ref >60)
Lab: >60 mL/min (ref >60)

## 2017-03-21 LAB — CBC: Lab: 14 10*3/uL — ABNORMAL HIGH (ref 4.5–11.0)

## 2017-03-21 MED ORDER — CEFAZOLIN INJ 1GM IVP
2 g | INTRAVENOUS | 0 refills | Status: CP
Start: 2017-03-21 — End: ?
  Administered 2017-03-21 – 2017-03-22 (×3): 2 g via INTRAVENOUS

## 2017-03-21 MED ORDER — ACETAMINOPHEN 500 MG PO TAB
1000 mg | ORAL | 0 refills | Status: DC
Start: 2017-03-21 — End: 2017-03-25
  Administered 2017-03-21 – 2017-03-25 (×13): 1000 mg via ORAL

## 2017-03-21 MED ORDER — ONDANSETRON HCL (PF) 4 MG/2 ML IJ SOLN
4 mg | Freq: Once | INTRAVENOUS | 0 refills | Status: DC | PRN
Start: 2017-03-21 — End: 2017-03-21

## 2017-03-21 MED ORDER — MIDAZOLAM 1 MG/ML IJ SOLN
INTRAVENOUS | 0 refills | Status: DC
Start: 2017-03-21 — End: 2017-03-21
  Administered 2017-03-21: 13:00:00 2 mg via INTRAVENOUS

## 2017-03-21 MED ORDER — ACETAMINOPHEN 325 MG PO TAB
650 mg | ORAL | 0 refills | Status: DC | PRN
Start: 2017-03-21 — End: 2017-03-25

## 2017-03-21 MED ORDER — ONDANSETRON HCL 4 MG PO TAB
4 mg | ORAL | 0 refills | Status: DC | PRN
Start: 2017-03-21 — End: 2017-03-25

## 2017-03-21 MED ORDER — PANTOPRAZOLE 40 MG PO TBEC
40 mg | Freq: Every day | ORAL | 0 refills | Status: DC
Start: 2017-03-21 — End: 2017-03-25
  Administered 2017-03-23 – 2017-03-24 (×2): 40 mg via ORAL

## 2017-03-21 MED ORDER — ACETAMINOPHEN 160 MG/5 ML PO SOLN
1000 mg | NASOGASTRIC | 0 refills | Status: DC
Start: 2017-03-21 — End: 2017-03-25

## 2017-03-21 MED ORDER — BISACODYL 10 MG RE SUPP
10 mg | Freq: Every day | RECTAL | 0 refills | Status: DC | PRN
Start: 2017-03-21 — End: 2017-03-25

## 2017-03-21 MED ORDER — ACETAMINOPHEN 650 MG RE SUPP
650 mg | RECTAL | 0 refills | Status: DC | PRN
Start: 2017-03-21 — End: 2017-03-25

## 2017-03-21 MED ORDER — LEVOTHYROXINE 88 MCG PO TAB
88 ug | Freq: Every day | ORAL | 0 refills | Status: DC
Start: 2017-03-21 — End: 2017-03-25
  Administered 2017-03-22 – 2017-03-25 (×4): 88 ug via ORAL

## 2017-03-21 MED ORDER — ATORVASTATIN 10 MG PO TAB
10 mg | Freq: Every evening | ORAL | 0 refills | Status: DC
Start: 2017-03-21 — End: 2017-03-25
  Administered 2017-03-22 – 2017-03-25 (×4): 10 mg via ORAL

## 2017-03-21 MED ORDER — MAGNESIUM HYDROXIDE 2,400 MG/10 ML PO SUSP
10 mL | Freq: Two times a day (BID) | ORAL | 0 refills | Status: DC
Start: 2017-03-21 — End: 2017-03-25
  Administered 2017-03-22 – 2017-03-23 (×4): 10 mL via ORAL

## 2017-03-21 MED ORDER — FENTANYL CITRATE (PF) 50 MCG/ML IJ SOLN
0 refills | Status: DC
Start: 2017-03-21 — End: 2017-03-21
  Administered 2017-03-21: 17:00:00 50 ug via INTRAVENOUS
  Administered 2017-03-21: 13:00:00 100 ug via INTRAVENOUS
  Administered 2017-03-21: 18:00:00 50 ug via INTRAVENOUS
  Administered 2017-03-21 (×2): 100 ug via INTRAVENOUS

## 2017-03-21 MED ORDER — BUPIVACAINE LIPOSOME (PF) 1.3 % (13.3 MG/ML) LINF SUSP
266 mg | Freq: Once | 0 refills | Status: CP
Start: 2017-03-21 — End: ?
  Administered 2017-03-21: 17:00:00 20 mL

## 2017-03-21 MED ORDER — DIPHENHYDRAMINE HCL 50 MG/ML IJ SOLN
12.5 mg | INTRAVENOUS | 0 refills | Status: DC | PRN
Start: 2017-03-21 — End: 2017-03-25

## 2017-03-21 MED ORDER — AMLODIPINE 5 MG PO TAB
2.5 mg | Freq: Two times a day (BID) | ORAL | 0 refills | Status: DC
Start: 2017-03-21 — End: 2017-03-25
  Administered 2017-03-22 – 2017-03-25 (×7): 2.5 mg via ORAL

## 2017-03-21 MED ORDER — ELECTROLYTE-A IV SOLP
0 refills | Status: DC
Start: 2017-03-21 — End: 2017-03-21
  Administered 2017-03-21: 17:00:00 via INTRAVENOUS

## 2017-03-21 MED ORDER — ALUM-MAG HYDROXIDE-SIMETH 200-200-20 MG/5 ML PO SUSP
30 mL | ORAL | 0 refills | Status: DC | PRN
Start: 2017-03-21 — End: 2017-03-25

## 2017-03-21 MED ORDER — SODIUM CHLORIDE 0.9 % IR SOLN
0 refills | Status: DC
Start: 2017-03-21 — End: 2017-03-21
  Administered 2017-03-21: 14:00:00 1000 mL

## 2017-03-21 MED ORDER — ASPIRIN 81 MG PO TBEC
81 mg | Freq: Every day | ORAL | 0 refills | Status: DC
Start: 2017-03-21 — End: 2017-03-25
  Administered 2017-03-22 – 2017-03-25 (×4): 81 mg via ORAL

## 2017-03-21 MED ORDER — LIDOCAINE (PF) 10 MG/ML (1 %) IJ SOLN
.1-2 mL | INTRAMUSCULAR | 0 refills | Status: DC | PRN
Start: 2017-03-21 — End: 2017-03-21

## 2017-03-21 MED ORDER — LACTATED RINGERS IV SOLP
INTRAVENOUS | 0 refills | Status: AC
Start: 2017-03-21 — End: ?

## 2017-03-21 MED ORDER — LOSARTAN 50 MG PO TAB
100 mg | Freq: Two times a day (BID) | ORAL | 0 refills | Status: DC
Start: 2017-03-21 — End: 2017-03-25
  Administered 2017-03-22 – 2017-03-25 (×7): 100 mg via ORAL

## 2017-03-21 MED ORDER — HYDRALAZINE 20 MG/ML IJ SOLN
10 mg | INTRAVENOUS | 0 refills | Status: DC | PRN
Start: 2017-03-21 — End: 2017-03-25

## 2017-03-21 MED ORDER — SODIUM CHLORIDE 0.9 % IV SOLP
INTRAVENOUS | 0 refills | Status: DC
Start: 2017-03-21 — End: 2017-03-22

## 2017-03-21 MED ORDER — FAMOTIDINE (PF) 20 MG/2 ML IV SOLN
20 mg | Freq: Two times a day (BID) | INTRAVENOUS | 0 refills | Status: DC
Start: 2017-03-21 — End: 2017-03-24

## 2017-03-21 MED ORDER — POTASSIUM CHLORIDE IN WATER 10 MEQ/50 ML IV PGBK
10 meq | INTRAVENOUS | 0 refills | Status: DC | PRN
Start: 2017-03-21 — End: 2017-03-25

## 2017-03-21 MED ORDER — PHENYLEPHRINE IV DRIP (STD CONC)
0 refills | Status: DC
Start: 2017-03-21 — End: 2017-03-21
  Administered 2017-03-21 (×2): 0.2 ug/kg/min via INTRAVENOUS

## 2017-03-21 MED ORDER — CETIRIZINE 10 MG PO TAB
10 mg | Freq: Every day | ORAL | 0 refills | Status: DC
Start: 2017-03-21 — End: 2017-03-25
  Administered 2017-03-21 – 2017-03-25 (×5): 10 mg via ORAL

## 2017-03-21 MED ORDER — HALOPERIDOL LACTATE 5 MG/ML IJ SOLN
0 refills | Status: DC
Start: 2017-03-21 — End: 2017-03-21
  Administered 2017-03-21: 17:00:00 1 mg via INTRAVENOUS

## 2017-03-21 MED ORDER — ONDANSETRON HCL (PF) 4 MG/2 ML IJ SOLN
4-8 mg | INTRAVENOUS | 0 refills | Status: DC | PRN
Start: 2017-03-21 — End: 2017-03-25
  Administered 2017-03-23 – 2017-03-25 (×3): 4 mg via INTRAVENOUS

## 2017-03-21 MED ORDER — PROMETHAZINE 25 MG/ML IJ SOLN
6.25 mg | INTRAVENOUS | 0 refills | Status: DC | PRN
Start: 2017-03-21 — End: 2017-03-21

## 2017-03-21 MED ORDER — CEFAZOLIN 1 GRAM IJ SOLR
0 refills | Status: DC
Start: 2017-03-21 — End: 2017-03-21
  Administered 2017-03-21: 14:00:00 3 g via INTRAVENOUS

## 2017-03-21 MED ORDER — HYDROMORPHONE (PF)-0.9 % NACL 10 MG/50 ML SYR (STD CONC)(ADULT)(PREMADE)
INTRAVENOUS | 0 refills | Status: DC
Start: 2017-03-21 — End: 2017-03-22
  Administered 2017-03-21 – 2017-03-22 (×2): 50.000 mL via INTRAVENOUS

## 2017-03-21 MED ORDER — HEPARIN, PORCINE (PF) 5,000 UNIT/0.5 ML IJ SYRG
5000 [IU] | Freq: Three times a day (TID) | SUBCUTANEOUS | 0 refills | Status: DC
Start: 2017-03-21 — End: 2017-03-25
  Administered 2017-03-22 – 2017-03-25 (×11): 5000 [IU] via SUBCUTANEOUS

## 2017-03-21 MED ORDER — LIDOCAINE (PF) 200 MG/10 ML (2 %) IJ SYRG
0 refills | Status: DC
Start: 2017-03-21 — End: 2017-03-21
  Administered 2017-03-21: 13:00:00 80 mg via INTRAVENOUS

## 2017-03-21 MED ORDER — PHENYLEPHRINE IN 0.9% NACL(PF) 1 MG/10 ML (100 MCG/ML) IV SYRG
INTRAVENOUS | 0 refills | Status: DC
Start: 2017-03-21 — End: 2017-03-21
  Administered 2017-03-21 (×4): 100 ug via INTRAVENOUS

## 2017-03-21 MED ORDER — SENNOSIDES-DOCUSATE SODIUM 8.6-50 MG PO TAB
2 | Freq: Two times a day (BID) | ORAL | 0 refills | Status: DC
Start: 2017-03-21 — End: 2017-03-25
  Administered 2017-03-22 – 2017-03-23 (×4): 2 via ORAL

## 2017-03-21 MED ORDER — FUROSEMIDE 20 MG PO TAB
20 mg | Freq: Every day | ORAL | 0 refills | Status: DC
Start: 2017-03-21 — End: 2017-03-25
  Administered 2017-03-22 – 2017-03-25 (×4): 20 mg via ORAL

## 2017-03-21 MED ORDER — POLYETHYLENE GLYCOL 3350 17 GRAM PO PWPK
1 | Freq: Two times a day (BID) | ORAL | 0 refills | Status: DC
Start: 2017-03-21 — End: 2017-03-25
  Administered 2017-03-22 – 2017-03-23 (×4): 17 g via ORAL

## 2017-03-21 MED ORDER — NALOXONE 0.4 MG/ML IJ SOLN
.08 mg | INTRAVENOUS | 0 refills | Status: DC | PRN
Start: 2017-03-21 — End: 2017-03-25

## 2017-03-21 MED ORDER — FENTANYL CITRATE (PF) 50 MCG/ML IJ SOLN
25 ug | INTRAVENOUS | 0 refills | Status: DC | PRN
Start: 2017-03-21 — End: 2017-03-21

## 2017-03-21 MED ORDER — SENNA/DOCUSATE(#) 8.8/50MG/10ML PO SOLN
20 mL | Freq: Two times a day (BID) | NASOGASTRIC | 0 refills | Status: DC
Start: 2017-03-21 — End: 2017-03-25

## 2017-03-21 MED ORDER — PROPOFOL INJ 10 MG/ML IV VIAL
0 refills | Status: DC
Start: 2017-03-21 — End: 2017-03-21
  Administered 2017-03-21: 14:00:00 30 mg via INTRAVENOUS
  Administered 2017-03-21: 16:00:00 20 mg via INTRAVENOUS
  Administered 2017-03-21: 13:00:00 100 mg via INTRAVENOUS
  Administered 2017-03-21: 17:00:00 20 mg via INTRAVENOUS
  Administered 2017-03-21: 14:00:00 30 mg via INTRAVENOUS

## 2017-03-21 MED ORDER — POTASSIUM CHLORIDE 20 MEQ/15 ML PO LIQD
20-40 meq | NASOGASTRIC | 0 refills | Status: DC | PRN
Start: 2017-03-21 — End: 2017-03-25

## 2017-03-21 MED ORDER — HYDROCHLOROTHIAZIDE 25 MG PO TAB
25 mg | Freq: Every day | ORAL | 0 refills | Status: DC
Start: 2017-03-21 — End: 2017-03-25
  Administered 2017-03-22 – 2017-03-25 (×4): 25 mg via ORAL

## 2017-03-21 MED ORDER — MEPERIDINE (PF) 25 MG/ML IJ SYRG
12.5 mg | INTRAVENOUS | 0 refills | Status: DC | PRN
Start: 2017-03-21 — End: 2017-03-21

## 2017-03-21 MED ORDER — HEPARIN, PORCINE (PF) 5,000 UNIT/0.5 ML IJ SYRG
5000 [IU] | Freq: Once | SUBCUTANEOUS | 0 refills | Status: CP
Start: 2017-03-21 — End: ?

## 2017-03-21 MED ORDER — FAMOTIDINE 20 MG PO TAB
20 mg | Freq: Two times a day (BID) | ORAL | 0 refills | Status: DC
Start: 2017-03-21 — End: 2017-03-24
  Administered 2017-03-22 – 2017-03-24 (×6): 20 mg via ORAL

## 2017-03-21 MED ORDER — KETOROLAC 30 MG/ML (1 ML) IJ SOLN
0 refills | Status: DC
Start: 2017-03-21 — End: 2017-03-21
  Administered 2017-03-21: 17:00:00 30 mg via INTRAVENOUS

## 2017-03-21 MED ORDER — POTASSIUM CHLORIDE 20 MEQ PO TBTQ
20-40 meq | ORAL | 0 refills | Status: DC | PRN
Start: 2017-03-21 — End: 2017-03-25
  Administered 2017-03-22: 12:00:00 40 meq via ORAL
  Administered 2017-03-24: 12:00:00 20 meq via ORAL
  Administered 2017-03-25: 14:00:00 40 meq via ORAL

## 2017-03-21 MED ORDER — ROCURONIUM 10 MG/ML IV SOLN
INTRAVENOUS | 0 refills | Status: DC
Start: 2017-03-21 — End: 2017-03-21
  Administered 2017-03-21: 15:00:00 20 mg via INTRAVENOUS
  Administered 2017-03-21 (×2): 10 mg via INTRAVENOUS
  Administered 2017-03-21: 16:00:00 20 mg via INTRAVENOUS

## 2017-03-21 MED ORDER — SUGAMMADEX 100 MG/ML IV SOLN
INTRAVENOUS | 0 refills | Status: DC
Start: 2017-03-21 — End: 2017-03-21
  Administered 2017-03-21: 18:00:00 250 mg via INTRAVENOUS

## 2017-03-21 MED ADMIN — FENTANYL CITRATE (PF) 50 MCG/ML IJ SOLN [3037]: 25 ug | INTRAVENOUS | @ 19:00:00 | Stop: 2017-03-21 | NDC 00409909412

## 2017-03-21 MED ADMIN — POTASSIUM CHLORIDE 20 MEQ PO TBTQ [35943]: 40 meq | ORAL | @ 20:00:00 | Stop: 2017-03-21 | NDC 66758019006

## 2017-03-21 MED ADMIN — SODIUM CHLORIDE 0.9 % IV SOLP [27838]: INTRAVENOUS | @ 13:00:00 | Stop: 2017-03-21 | NDC 00338004904

## 2017-03-21 MED ADMIN — LACTATED RINGERS IV SOLP [4318]: INTRAVENOUS | @ 19:00:00 | Stop: 2017-03-21 | NDC 00338011704

## 2017-03-21 MED ADMIN — HEPARIN, PORCINE (PF) 5,000 UNIT/0.5 ML IJ SYRG [95535]: 5000 [IU] | SUBCUTANEOUS | @ 13:00:00 | Stop: 2017-03-21 | NDC 00409131611

## 2017-03-21 NOTE — H&P (View-Only)
Admission History and Physical Examination      Name:  Alicia Fox                                             MRN:  [id number]   Admission Date:  03/21/2017                     Assessment/Plan:    Active Problems:    Hypertension    Fen-phen history    Obesity    Hyperlipemia    Weight gain    Bilateral edema of lower extremity    Dyspnea on exertion    Diastolic heart failure (HCC)    Medication side effects    Cough    [place] cardiac risk <10% in next 10 years    Weight loss    Carpal tunnel syndrome, bilateral    Abnormal CT of the head    Hypercalcemia    Spondylosis of cervical region without myelopathy or radiculopathy    Carpal tunnel syndrome of left wrist    Carpal tunnel syndrome of right wrist    Carcinoid tumor of lung      Primary Care Physician: Orson Gear  Verified    Chief Complaint:  Left lower lobe pulmonary carcinoid tumor    History of Present Illness: Alicia Fox is a 67 y.o. 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 ???[name] presented to an OSH with cough for a few months, no improvement with two rounds of antibiotics and steroids. ???CT imaging on 12/16/16 demonstrated a left lower lobe pulmonary nodule measuring 2.5 cm. 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    This morning she denies; recent trip to the ED for fever or antibiotics, cough, hemoptysis, chest or abdominal pain, change in bowel or bladder, unexplained weight loss, night sweats, chills, new headaches or sudden change in vision.     She c/o SOA which increases with exertion. Also notes feeling more tired at the end of the day. Dr. Ignacia Palma has reviewed all of the patient's preop testing. He agrees that surgical intervention is the most appropriate course of action. The entire procedure, risks, benefits, alternatives, and complications have been discussed. Potential risks include but are not limited to infection, arrhythmias, bleeding requiring re-operation, prolonged intubation, ARF requiring hemodialysis, CVA, MI, and possibly even death. The patient understands, accepts, and wishes to proceed.    Past Medical History:   Diagnosis Date   ??? Arthritis     lumbar, knees   ??? Cancer (HCC)     breast   ??? Carpal tunnel syndrome on both sides 2018    L > R   ??? Disorder of thyroid gland     hypothyroid   ??? Gastrointestinal disorder     gerd at times-under control with meds   ??? HTN (hypertension)    ??? Hyperlipidemia    ??? Pneumonia     bilateral bacterial   ??? Psychiatric illness     depression     Past Surgical History:   Procedure Laterality Date   ??? HX MASTECTOMY Bilateral 1986  1986-stomberg mastectomy   ??? BREAST BIOPSY  07/1984   ??? MASTECTOMY, PARTIAL Bilateral 12/1984   ??? MASTECTOMY, MODIFIED RADICAL Bilateral 1987    1987-full modified   ??? BREAST SURGERY Bilateral 07/1985    implants   ??? HX DILATION AND CURETTAGE  07/1994   ??? HX HYSTERECTOMY  10/1994    complete   ??? HX ABDOMINOPLASTY  10/1994   ??? HIP SURGERY  02/1995   ??? HX LIPOMA RESECTION  11/1999    outer upper left arm   ??? ANUS SURGERY  01/2010    anal fissure   ??? HX LIPOMA RESECTION  02/2010    inside upper right arm   ??? CARPAL TUNNEL RELEASE Left 10/17/2016    LEFT DECOMPRESSION NERVE MEDIAN WITH CARPAL TUNNEL RELEASE performed by Delice Lesch, MD at [facility] OR/Periop   ??? BRONCHOSCOPY N/A 01/13/2017    BRONCHOSCOPY performed by Aud[facility] Box, [facility] at [facility]   ??? BRONCHOSCOPY N/A 01/13/2017    BRONCHOSCOPY WITH TRANSBRONCHIAL BIOPSY performed by Audie Box, MD at [facility] OR/Periop   ??? BRONCHOSCOPY N/A 01/13/2017    BRONCHOSCOPY WITH ENDOBRONCHIAL ULTRASOUND GUIDED TRANSTRACHEAL/ TRANSBRONCHIAL SAMPLING - 3 OR MORE MEDIASTINAL/ HILAR LYMPH NODE STATIONS/ STRUCTURE - RIGID performed by Audie Box, MD at [facility] OR/Periop   ??? BRONCHOSCOPY N/A 01/13/2017    BRONCHOSCOPY WITH IMAGE - GUIDED NAVIGATION - FLEXIBLE performed by Audie Box, MD at [facility]/Periop   ??? SPINE SURGERY  1993 and 1996    cervical fusion     Family history reviewed; non-contributory  Social History     Socioeconomic History   ??? Marital status: Widowed     Spouse name: Not on file   ??? Number of children: Not on file   ??? Years of education: Not on file   ??? Highest education level: Not on file   Social Needs   ??? Financial resource strain: Not on file   ??? Food insecurity - worry: Not on file   ??? Food insecurity - inability: Not on file   ??? Transportation needs - medical: Not on file   ??? Transportation needs - non-medical: Not on file   Occupational History   ??? Occupation: retired   Tobacco Use   ??? Smoking status: Former Smoker     Last attempt to quit: 03/04/1989     Years since quitting: 28.0   ??? Smokeless tobacco: Never Used   ??? Tobacco comment: quit in 1991   Substance and Sexual Activity   ??? Alcohol use: Yes     Comment: very very rare   ??? Drug use: No   ??? Sexual activity: Not on file   Other Topics Concern   ??? Not on file   Social History Narrative   ??? Not on file      Immunizations (includes history and patient reported):   There is no immunization history on file for this patient.        Allergies:  Codeine    Medications:  Current Facility-Administered Medications   Medication   ??? bupivacaine liposome (PF) (EXPAREL) 266 mg/20 mL (13.3 mg/mL) injection 20 mL   ??? heparin (porcine) PF syringe 5,000 Units   ??? HEPARIN, PORCINE (PF) 5,000 UNIT/0.5 ML IJ SYRG [employer])   ??? LIDOCAINE (PF) 10 MG/ML (1 %) IJ SOLN [employer])   ??? lidocaine PF 1% (10 mg/mL) injection 0.1-2 mL   ??? sodium chloride 0.9 %   infusion   ??? SODIUM CHLORIDE 0.9 %  IV SOLP [employer])       Review of Systems: GENERAL: No fever, weakness, anorexia, weight loss  HEENT:??? No headache, double vision, sore throat  HEMATOLOGICAL/LYMPHATIC: Negative for bleeding, swollen nodes/lumps. ???  CARDIOVASCULAR: No chest pain, palpitations, orthopnea, PND  RESPIRATORY: No cough, SOB, phlegm  GI: No abdominal pain, nausea, vomiting, diarrhea, constipation  GU: No flank pain, burning micturition, frequency, bloody urine  EXTREMITIES: No leg swelling  MuSk: No joint swelling, redness, pain  SKIN: No rash, itching  CNS: No muscle weakness, numbness, tingling.  Allergic/Immunological: No environmental allergies.  ENDOCRINE: Negative  PSYCHIATRIC: Negative    Physical Exam:  Vital Signs: Last Filed In 24 Hours Vital Signs: 24 Hour Range   BP: 149/67 (01/18 0630)  Temp: 37.1 ???C (98.8 ???F) (01/18 1610)  Pulse: 84 (01/18 0630)  Respirations: 13 PER MINUTE (01/18 0630)  SpO2: 94 % (01/18 0630)  SpO2 Pulse: 83 (01/18 0630) BP: (146-149)/(67-82)   Temp:  [37.1 ???C (98.8 ???F)]   Pulse:  [84]   Respirations:  [13 PER MINUTE]   SpO2:  [94 %-96 %]           Physical Exam:  Constitutional: Appears well-developed and well-nourished.   HENT: ???  Head: Normocephalic.   Eyes: Pupils are equal, round, and reactive to light.   Neck: Normal range of motion. Neck supple.   Cardiovascular: Normal rate, regular rhythm and normal heart sounds.??? No murmur heard.  Pulmonary/Chest: No work of breathing present. Bilateral lung sounds clear bilaterally.  No respiratory distress ,wheezes,or rales.   Abdominal: Soft/NT. Bowel sounds are normal.   Lymphadenopathy: No cervical adenopathy.   Neurological: Alert and oriented to person, place, and time.   Skin: Skin is warm and dry. No rash noted. No erythema. No pallor.   Psychiatric: Normal mood and affect. Judgment normal.     Lab/Radiology/Other Diagnostic Tests:  24-hour labs:  No results found for this visit on 03/21/17 (from the past 24 hour(s)).    Results for YAR, CLOWNEY (MRN [id number]) as of 03/21/2017 06:58 Ref. Range 02/27/2017 09:53   Hemoglobin Latest Ref Range: 12.0 - 15.0 GM/DL 98.1   Hematocrit Latest Ref Range: 36 - 45 % 38.0   Platelet Count Latest Ref Range: 150 - 400 K/UL 274   White Blood Cells Latest Ref Range: 4.5 - 11.0 K/UL 8.2   RBC Latest Ref Range: 4.0 - 5.0 M/UL 5.18 (H)   MCV Latest Ref Range: 80 - 100 FL 73.3 (L)   MCH Latest Ref Range: 26 - 34 PG 23.3 (L)   MCHC Latest Ref Range: 32.0 - 36.0 G/DL 19.1 (L)   MPV Latest Ref Range: 7 - 11 FL 8.5   RDW Latest Ref Range: 11 - 15 % 14.7   INR Latest Ref Range: 0.8 - 1.2  0.9   APTT Latest Ref Range: 24.0 - 36.5 SEC 27.2   Sodium Latest Ref Range: 137 - 147 MMOL/L 138   Potassium Latest Ref Range: 3.5 - 5.1 MMOL/L 3.5   Chloride Latest Ref Range: 98 - 110 MMOL/L 101   CO2 Latest Ref Range: 21 - 30 MMOL/L 29   Anion Gap Latest Ref Range: 3 - 12  8   Blood Urea Nitrogen Latest Ref Range: 7 - 25 MG/DL 20   Creatinine Latest Ref Range: 0.4 - 1.00 MG/DL 4.78   eGFR Non African American Latest Ref Range: >60 mL/min >60   eGFR African American Latest Ref Range: >60 mL/min >60  Glucose Latest Ref Range: 70 - 100 MG/DL 295 (H)   Albumin Latest Ref Range: 3.5 - 5.0 G/DL 4.2   Calcium Latest Ref Range: 8.5 - 10.6 MG/DL 62.1 (H)   Total Bilirubin Latest Ref Range: 0.3 - 1.2 MG/DL 0.4   Total Protein Latest Ref Range: 6.0 - 8.0 G/DL 7.0   AST (SGOT) Latest Ref Range: 7 - 40 U/L 11   ALT (SGPT) Latest Ref Range: 7 - 56 U/L 11   Alk Phosphatase Latest Ref Range: 25 - 110 U/L 127 (H)   ABO/RH(D) Unknown B POS   Antibody Screen Unknown NEG   Blood Component Type Unknown RED CELL GROUP      Work up summary:???  08/02/16 thallium stress test- This study is probably normal with no evidence of significant myocardial ischemia. Left ventricular systolic function is normal. There are no high risk prognostic indicators present. ???The ECG portion of the study is negative for ischemia.- Dr. Avie Arenas, cardiologist???  ???  12/18/16 - consultation w/ [facility]???Dr. Brown Human  ??? 01/03/17 - PET scan ???- ???LLL pulmonary nodule demonstrating mild hypermetabolic activity; max SUV of 2.4 (index -389, CT image 115). ???No evidence of regional nodal or distant metastatic disease.  ???  01/03/17 ??? PFTs: FEV1 ??? 83%, DLCO 78%  ???  01/13/17 - CT chest ???- ???Stable dominant lobulated nodule in the LLL; measuring up to 2.5 cm on series 2, image 40. ???Several tiny pulmonary nodules are noted which may be scars or granulomas. No thoracic lymphadenopathy.  ???  01/13/17 - bronchoscopy/EBUS  Bronchial mucosa and alveolated lung parenchyma, lll mass, biopsy Path: ???Small fragment of carcinoid tumor; within comment:??????favoring a typical carcinoid in this small biopsy; however, definite distinction between typical and atypical carcinoid is deferred to more extensive sampling  ???  11R- scant polymorphous population of lymphocytes. ???No carcinoma identified in the sampled specimen  BAL- Negative for malignant cells. The Grocott stain is???negative for Pneumocystis and other fungal organisms.  ???  01/16/17 - presented at thoracic tumor conference   ?????????Recommendation: thoracic surgery referral to discuss resection      Pertinent radiology reviewed.     Elvina Mattes, APRN, FNP-C  [facility]  [facility]  Phone: [phone]  Pager: [phone]

## 2017-03-21 NOTE — Other
Brief Operative Note    Name: Alicia Fox is a 67 y.o. female     DOB: [birth year]             MRN#: [id number]  DATE OF OPERATION: 03/21/2017    Date:  03/21/2017        Preoperative Dx:   Left lower lobe carcinoid tumor    Post-op Diagnosis   Left lower lobe carcinoid tumor      Procedure:  1.  Robotic assisted left lower lobectomy  2.  Mediastinal lymph node dissection  3.  Regional intercostal and serratus anterior nerve block  4.  Bronchoscopy    Anesthesia Type: General     Surgeon(s) and Role:     * Darrall Dears, MD - Primary     * Almoghrabi, Marinus Maw, MD - Fellow    Assistant:  Huey Romans, PA-C      Findings:  Left lower lobe carcinoid    Estimated Blood Loss: 50 ml    Specimen(s) Removed/Disposition:   ID Type Source Tests Collected by Time Destination   1 : Level 10 lymph node - Permanent Tissue Lymph Node (Specify) SURGICAL PATHOLOGY          Darrall Dears, MD 03/21/2017 0941    2 : Left lower lobe (permanent) Tissue Lung LLL SURGICAL PATHOLOGY          Darrall Dears, MD 03/21/2017 0300        Complications:  None    Implants: None    Drains: 28 Fr chest tube left pleural space    Disposition:  PACU - stable    Darrall Dears, MD  Pager

## 2017-03-21 NOTE — Discharge Instructions - Pharmacy
Physician Discharge Summary      Name: Alicia Fox  Medical Record Number: [id number]        Account Number:  [id number]  Date Of Birth:  07/05/1950                         Age:  67 years   Admit date:  03/21/2017                     Discharge date:  03/25/2017    Attending Physician:  Particia Nearing              Service: Cardiothor Surg    Physician Summary completed by: Elvina Mattes, APRN-C    Reason for hospitalization: Left lower lobe pulmonary carcinoid tumor      Significant PMH:   Past Medical History:   Diagnosis Date   ??? Arthritis     lumbar, knees   ??? Cancer (HCC)     breast   ??? Carpal tunnel syndrome on both sides 2018    L > R   ??? Disorder of thyroid gland     hypothyroid   ??? Gastrointestinal disorder     gerd at times-under control with meds   ??? HTN (hypertension)    ??? Hyperlipidemia    ??? Pneumonia     bilateral bacterial   ??? Psychiatric illness     depression         Allergies: Codeine    Admission Physical Exam notable for:    Physical Exam:  Vital Signs: Last Filed In 24 Hours Vital Signs: 24 Hour Range   BP: 149/67 (01/18 0630)  Temp: 37.1 ???C (98.8 ???F) (01/18 4540)  Pulse: 84 (01/18 0630)  Respirations: 13 PER MINUTE (01/18 0630)  SpO2: 94 % (01/18 0630)  SpO2 Pulse: 83 (01/18 0630) BP: (146-149)/(67-82)   Temp:  [37.1 ???C (98.8 ???F)]   Pulse:  [84]   Respirations:  [13 PER MINUTE]   SpO2:  [94 %-96 %]     ???   ???  Physical Exam:  Constitutional: Appears well-developed and well-nourished.   HENT: ???  Head: Normocephalic.   Eyes: Pupils are equal, round, and reactive to light.   Neck: Normal range of motion. Neck supple.   Cardiovascular: Normal rate, regular rhythm and normal heart sounds.??? No murmur heard.  Pulmonary/Chest: No work of breathing present. Bilateral lung sounds clear bilaterally.  No respiratory distress ,wheezes,or rales.   Abdominal: Soft/NT. Bowel sounds are normal.   Lymphadenopathy: No cervical adenopathy.   Neurological: Alert and oriented to person, place, and time. Skin: Skin is warm and dry. No rash noted. No erythema. No pallor.   Psychiatric: Normal mood and affect. Judgment normal.       Admission Lab/Radiology studies notable for:    Results for Alicia Fox, Alicia Fox (MRN [id number]) as of 03/21/2017 06:58  ??? Ref. Range 02/27/2017 09:53   Hemoglobin Latest Ref Range: 12.0 - 15.0 GM/DL 78.2   Hematocrit Latest Ref Range: 36 - 45 % 38.0   Platelet Count Latest Ref Range: 150 - 400 K/UL 274   White Blood Cells Latest Ref Range: 4.5 - 11.0 K/UL 8.2   RBC Latest Ref Range: 4.0 - 5.0 M/UL 5.18 (H)   MCV Latest Ref Range: 80 - 100 FL 73.3 (L)   MCH Latest Ref Range: 26 - 34 PG 23.3 (L)   MCHC Latest Ref Range: 32.0 - 36.0 G/DL 95.6 (L)  MPV Latest Ref Range: 7 - 11 FL 8.5   RDW Latest Ref Range: 11 - 15 % 14.7   INR Latest Ref Range: 0.8 - 1.2  0.9   APTT Latest Ref Range: 24.0 - 36.5 SEC 27.2   Sodium Latest Ref Range: 137 - 147 MMOL/L 138   Potassium Latest Ref Range: 3.5 - 5.1 MMOL/L 3.5   Chloride Latest Ref Range: 98 - 110 MMOL/L 101   CO2 Latest Ref Range: 21 - 30 MMOL/L 29   Anion Gap Latest Ref Range: 3 - 12  8   Blood Urea Nitrogen Latest Ref Range: 7 - 25 MG/DL 20   Creatinine Latest Ref Range: 0.4 - 1.00 MG/DL 1.61   eGFR Non African American Latest Ref Range: >60 mL/min >60   eGFR African American Latest Ref Range: >60 mL/min >60   Glucose Latest Ref Range: 70 - 100 MG/DL 096 (H)   Albumin Latest Ref Range: 3.5 - 5.0 G/DL 4.2   Calcium Latest Ref Range: 8.5 - 10.6 MG/DL 04.5 (H)   Total Bilirubin Latest Ref Range: 0.3 - 1.2 MG/DL 0.4   Total Protein Latest Ref Range: 6.0 - 8.0 G/DL 7.0   AST (SGOT) Latest Ref Range: 7 - 40 U/L 11   ALT (SGPT) Latest Ref Range: 7 - 56 U/L 11   Alk Phosphatase Latest Ref Range: 25 - 110 U/L 127 (H)   ABO/RH(D) Unknown B POS   Antibody Screen Unknown NEG   Blood Component Type Unknown RED CELL GROUP      Work up summary:???  08/02/16 thallium stress test- This study is probably normal with no evidence of significant myocardial ischemia. Left ventricular systolic function is normal. There are no high risk prognostic indicators present. ???The ECG portion of the study is negative for ischemia.- Dr. Avie Arenas, cardiologist???  ???  12/18/16 - consultation w/ [facility]???Dr. Brown Human  ???  01/03/17 - PET scan ???- ???LLL pulmonary nodule demonstrating mild hypermetabolic activity; max SUV of 2.4 (index -389, CT image 115). ???No evidence of regional nodal or distant metastatic disease.  ???  01/03/17 ??????PFTs: FEV1 ??????83%, DLCO 78%  ???  01/13/17 - CT chest ???- ???Stable dominant lobulated nodule in the LLL; measuring up to 2.5 cm on series 2, image 40. ???Several tiny pulmonary nodules are noted which may be scars or granulomas. No thoracic lymphadenopathy.  ???  01/13/17 - bronchoscopy/EBUS  Bronchial mucosa and alveolated lung parenchyma, lll mass, biopsy Path: ???Small fragment of carcinoid tumor; within comment:??????favoring a typical carcinoid in this small biopsy; however, definite distinction between typical and atypical carcinoid is deferred to more extensive sampling  ???  11R- scant polymorphous population of lymphocytes. ???No carcinoma identified in the sampled specimen  BAL- Negative for malignant cells. The Grocott stain is???negative for Pneumocystis and other fungal organisms.  ???  01/16/17 - presented at thoracic tumor conference   ?????????Recommendation: thoracic surgery referral to discuss resection  ???    Brief Hospital Course:  Alicia Fox is a 67 y.o. 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  The patient was admitted to [facility] on 03/21/2017 for elective surgery by Dr Ignacia Palma.  She  was placed postoperatively on [facility] unit through out the postoperative hospitalization. On POD #1 her Patient controlled analgesia was discontinued and she was started on oral pain medications with good relief.  She increased her activity and oral intake  to a Cardiac diet.???The chest tube was removed on POD#2 with a stable follow up 2 view chest xray. On POD #3 an Exercise Oximetry was completed with recommendations for 1 Liter of oxygen per nasal cannula at rest and 3 Liters of oxygen per nasal cannula with exercise. Physical and Occupational therapy was consulted for evaluation for skilled nursing facility placement.  She has had normal bowel and bladder function, and was stable to be discharged to a skilled nursing facitlity on POD# 4??? Surgical pathology and  suture removal is pending on discharge.      Condition at Discharge: Stable    Discharge Diagnoses:      Hospital Problems        Active Problems    Hypertension    Fen-phen history    Obesity    Hyperlipemia    Weight gain    Bilateral edema of lower extremity    Dyspnea on exertion    Diastolic heart failure (HCC)    Medication side effects    Cough    [place] cardiac risk <10% in next 10 years    Weight loss    Carpal tunnel syndrome, bilateral    Abnormal CT of the head    Hypercalcemia    Spondylosis of cervical region without myelopathy or radiculopathy    Carpal tunnel syndrome of left wrist    Carpal tunnel syndrome of right wrist    Carcinoid tumor of lung          Surgical Procedures:   1. ???Robotic assisted left lower lobectomy  2. ???Mediastinal lymph node dissection  3. ???Regional intercostal and serratus anterior nerve block  4. ???Bronchoscopy      Significant Diagnostic Studies and Procedures: noted in brief hospital course    Consults:  Anesthesiology and Physical therapy and Occupational therapy    Patient Disposition: Home       Patient instructions/medications:      CHEST 2 VIEWS   Standing Status: Future Standing Exp. Date: 03/21/18         Reason for exam:(Sign,Symptom,Reason) s/p robotic left lower lobectomy      Activity as Tolerated    It is important to keep increasing your activity level after you leave the hospital.  Moving around can help prevent blood clots, lung infection (pneumonia) and other problems.  Gradually increasing the number of times you are up moving around will help you return to your normal activity level more quickly.  Continue to increase the number of times you are up to the chair and walking daily to return to your normal activity level. Begin to work toward your normal activity level at discharge     Driving Restrictions    No driving while taking pain medication and until physician approval at follow-up appointment.     Lifting Restrictions    Do not lift more than 10 pounds until after follow-up appointment.     PR NURSING FAC CARE, SUBSEQ, IMPROVING     Report These Signs and Symptoms    Please contact your doctor if you have any of the following symptoms: temperature higher than 100 degrees F, uncontrolled pain, foul smelling drainage/redness/swelling at chest incision sites, persistent nausea and/or vomiting or difficulty breathing     Questions About Your Stay    For questions or concerns regarding your hospital stay. Call [phone]     Discharging attending physician: Particia Nearing [[id number]]      Cardiac Diet    Limiting unhealthy fats and cholesterol  is the most important step you can take in reducing your risk for cardiovascular disease.  Unhealthy fats include saturated and trans fats.  Monitor your sodium and cholesterol intake.  Restrict your sodium to 2g (grams) or 2000mg  (milligrams) daily, and your cholesterol to 200mg  daily.    If you have questions regarding your diet at home, you may contact a dietitian at [phone].       Incision Care    *Keep your incision clean and dry.  *May shower   *Do not submerge incision in tub, pool, hot tub, or lake for 4 weeks.  *There is a stitch to be removed. Surgical adhesive  will begin to fall off in 10-14 days.*Your incision should gradually look better each day. If you notice unusual swelling, redness, drainage, have increasing pain at the site, or have a fever greater than 100 degrees, notify your physician immediately.     Return Appointment    You will need to go to the second floor of the main hospital for your  chest xray at 9:45, follow the signs to Radiology once you exit the elevator.  Following the chest xray you have an appointment at  to see Dr Ignacia Palma at 10:45 in the [facility] office.     [place] Provider Particia Nearing [[id number]]    Location [facility]    Appointment date: 04/08/2017    Appointment time: 9:45 AM      Suture/Staple Removal    You will need your sutures removed at follow up appointment.     Opioid (Narcotic) Safety Information    OPIOID (NARCOTIC) PAIN MEDICATION SAFETY    We care about your comfort, and believe you need opioid medications at this time to treat your pain.  An opioid is a strong pain medication.  It is only available by prescription for moderate to severe pain.  Usually these medications are used for only a short time to treat pain, but sometimes will be prescribed for longer.  Talk with your doctor or nurse about how long they expect you to need this medication.    When used the right way, opioids are safe and effective medications to treat your pain, even when used for a long time.  Yet, when used in the wrong way, opioids can be dangerous for you or others.  Opioids do not work for everyone.  Most patients do not get full relief of their pain from opioid medication; full relief of your pain may not be possible.     For your safety, we ask you to follow these instructions:    *Only take your opioid medication as prescribed.  If your pain is not controlled with the prescribed dose, or the medication is not lasting long enough, call your doctor.  *Do not break or crush your opioid medication unless your doctor or pharmacist says you can.  With certain medications, this can be dangerous, and may cause death. *Never share your medications with others, even if they appear to have a good reason.  Never take someone else's pain medication-this is dangerous, and illegal (a crime).  Overdoses and deaths have occurred.  *Keep your opioid medications safe, as you would with cash, in a lock box or similar container.  *Make sure your opioids are going to be secure, especially if you are around children or teens.  *Talk with your doctor or pharmacist before you take other medications.  *Avoid driving, operating machinery, or drinking alcohol while taking opioid pain medication.  This  may be unsafe.    Pain medications can cause constipation. Constipation is bowel movements that are less often than normal. Stools often become very hard and difficult to pass. This may lead to stomach pain and bloating. It may also cause pain when trying to use the bathroom. Constipation may be treated with suppositories, laxatives or stool softeners. A diet high in fiber with plenty of fluids helps to maintain regular, soft bowel movements.     Discharge Comments    You will require home oxygen therapy   Your exercise oximetry recommendations:    At Rest While Awake Results  ???Oxygen dose required at rest while awake: 1L/nasal cannula    ???  ???With Exercise Results:  ???Oxygen required with exercise: 3L/nasal cannula     DISCHARGE PATIENT CONTINGENT     Discharge contingent reason Equipment/Supplies or Home Health Services    Discharge contingent reason Consult Complete    Discharge contingent reason Other (comment)    Specify equipment/supplies or home health services home oxygen    Specify all consults Physical therapy and Occupational therapy for recommendations for swing bed at discahrge.    Specify other Swing bed approval at discahrge and acceptance at a facility.    Discharging attending physician: Particia Nearing [[id number]]       Current Discharge Medication List       START taking these medications    Details acetaminophen (TYLENOL) 500 mg tablet Take two tablets by mouth every 6 hours as needed for Pain. Max of 4,000 mg of acetaminophen in 24 hours.  Refills: 0    PRESCRIPTION TYPE:  OTC      milk of magnesia (CONC) 2,400 mg/10 mL oral suspension Take 10 mL by mouth twice daily.  Qty: 360 mL    PRESCRIPTION TYPE:  OTC      oxyCODONE (ROXICODONE, OXY-IR) 5 mg tablet Take one tablet by mouth every 4 hours as needed  Qty: 20 tablet, Refills: 0    PRESCRIPTION TYPE:  Print      polyethylene glycol 3350 (MIRALAX) 17 g packet Take one packet by mouth twice daily.  Qty: 12 each    PRESCRIPTION TYPE:  OTC      traMADol (ULTRAM) 50 mg tablet Take one tablet to two tablets by mouth every 6 hours as needed.  Qty: 30 tablet, Refills: 0    PRESCRIPTION TYPE:  Print          CONTINUE these medications which have NOT CHANGED    Details   amLODIPine (NORVASC) 2.5 mg tablet TAKE 1 TABLET BY MOUTH TWICE DAILY.  Qty: 180 tablet, Refills: 3    PRESCRIPTION TYPE:  Normal      aspirin EC 81 mg tablet Take 1 Tab by mouth daily.  Qty: 90 Tab, Refills: 3    PRESCRIPTION TYPE:  No Print      atorvastatin (LIPITOR) 10 mg tablet Take 10 mg by mouth at bedtime daily.    PRESCRIPTION TYPE:  Historical Med      cetirizine (ZYRTEC) 10 mg tablet Take 10 mg by mouth daily.    PRESCRIPTION TYPE:  Historical Med      cinnamon bark (CINNAMON PO) Take 1 tablet by mouth daily.    PRESCRIPTION TYPE:  Historical Med      Cranberry Extract 500 mg cap Take 1 capsule by mouth daily.    PRESCRIPTION TYPE:  Historical Med      Diphenhydramine-Acetaminophen (TYLENOL PM EXTRA STRENGTH) 25-500 mg tab tablet Take 1 tablet  by mouth at bedtime daily.  Qty: 10 tablet, Refills: 0    PRESCRIPTION TYPE:  Print      Folic Acid 800 mcg tab Take 1 tablet by mouth daily.    PRESCRIPTION TYPE:  Historical Med      furosemide (LASIX) 20 mg tablet Take 20-40 mg by mouth. 20 mg every four days per week and 40 mg three days per week (Mon, Wed, Fri) PRESCRIPTION TYPE:  Historical Med      glucosamine HCl/chondroitin su (GLUCOSAMINE-CHONDROITIN PO) Take 1 tablet by mouth daily.    PRESCRIPTION TYPE:  Historical Med      hydroCHLOROthiazide (HYDRODIURIL) 25 mg tablet TAKE 1 TAB BY MOUTH DAILY.  Qty: 90 tablet, Refills: 3    PRESCRIPTION TYPE:  Normal      KRILL OIL-OMEGA-3-DHA-EPA PO Take 350 mg by mouth daily.    PRESCRIPTION TYPE:  Historical Med      levothyroxine (SYNTHROID) 88 mcg tablet Take 88 mcg by mouth daily 30 minutes before breakfast.    PRESCRIPTION TYPE:  Historical Med      losartan(+) (COZAAR) 100 mg tablet TAKE 1 TAB BY MOUTH TWICE DAILY.  Qty: 180 tablet, Refills: 3    PRESCRIPTION TYPE:  Normal      pantoprazole DR (PROTONIX) 40 mg tablet Take 40 mg by mouth. Monday, Wednesday, Fridays    PRESCRIPTION TYPE:  Historical Med      polycarbophil (FIBERCON) 625 mg tablet Take 1,250 mg by mouth twice daily.    PRESCRIPTION TYPE:  Historical Med      TURMERIC ROOT EXTRACT PO Take 1 tablet by mouth daily.    PRESCRIPTION TYPE:  Historical Med      vit C,E-Zn-coppr-lutein-zeaxan (OCUVITE LUTEIN AND ZEAXANTHIN) 60 mg-30 unit- 15 mg-2 mg-6 mg cap Take 1 tablet by mouth daily.    PRESCRIPTION TYPE:  Historical Med              Scheduled appointments:    Apr 08, 2017 10:45 AM CST  Post - Op with Particia Nearing, MD  [facility]) [facility] [id number]  [address]  [place] [address]  [phone]   May 27, 2017 10:30 AM CDT  Return Patient with Nickolas Madrid, MD  Cardiovascular Medicine at [facility] [place]) [address]  [address]  [place] [address]  [phone]          Pending items needing follow up: Surgical Pathology and chest tube suture removal    Signed:  Elvina Mattes, APRN, FNP-C  [facility]  [facility]  Phone: [phone]  Pager: [phone]    cc:  Primary Care Physician:  Orson Gear   Verified  Referring physicians:  Particia Nearing, MD Additional provider(s):

## 2017-03-21 NOTE — Progress Notes
Cardiothoracic Surgery Progress Note 03/21/2017     Patient: Alicia Fox  MRN: [id number]    Admission date 03/21/2017, LOS: 0 days    ASSESSMENT:  Alicia Fox is a 67 y.o. Female with Carcinoid tumor of lung [D3A.090]  Carcinoid tumor of left lung [D3A.090]   32F with LLL carcinoid tumor s/p robotic left lower lobectomy 1/18.     PLAN:  -Cardiac diet  -Pain control with dilaudid PCA. Switch to PO pain today with tylenol and oxycodone.   -L chest tube in place to suction. CXR reviewed. Had 360cc serosanguinous output since surgery. Water seal today and CXR in 4 hrs   -Bowel regimen  -PTA meds: lasix, synthroid aspirin, norvasc, HCTZ, losartan and protonix  -Encourage ambulation  -Remove foley and art line today  -SCDs and heparin for prophylaxis   -Continue floor care    Discussed with Dr. Micael Hampshire, MD  Pager [phone]  ________________________________________________________________________   SUBJECTIVE:  No events overnight. Pain controlled. Denies n/v, sob or chest pain.       OBJECTIVE:                   Vital Signs: Last Filed                Vital Signs: 24 Hour Range   BP: 149/67 (01/18 0630)  Temp: 37.1 C (98.8 F) (01/18 0620)  Pulse: 89 (01/18 0700)  Respirations: 19 PER MINUTE (01/18 0700)  SpO2: 97 % (01/18 0700)  BP: (146-149)/(67-82)   Temp:  [37.1 C (98.8 F)]   Pulse:  [84-89]   Respirations:  [13 PER MINUTE-19 PER MINUTE]   SpO2:  [94 %-97 %]    Intensity Pain Scale (Self Report): (not recorded)      A&O, NAD  CTAB, no R/R/W, unlabored, L CT in place- no air leak, SS drainage, incisions CDI  RRR, no M/R/G  Abd Soft/NT/ND/+BS  No C/C/E    No intake or output data in the 24 hours ending 03/21/17 0709

## 2017-03-22 ENCOUNTER — Inpatient Hospital Stay: Admit: 2017-03-22 | Discharge: 2017-03-22 | Payer: MEDICARE

## 2017-03-22 DIAGNOSIS — D3A090 Benign carcinoid tumor of the bronchus and lung: Principal | ICD-10-CM

## 2017-03-22 LAB — CBC: Lab: 10 10*3/uL — ABNORMAL LOW (ref >60)

## 2017-03-22 LAB — BASIC METABOLIC PANEL: Lab: 136 MMOL/L — ABNORMAL LOW (ref >60)

## 2017-03-22 MED ORDER — OXYCODONE 5 MG PO TAB
5-15 mg | ORAL | 0 refills | Status: DC | PRN
Start: 2017-03-22 — End: 2017-03-23
  Administered 2017-03-22 (×2): 15 mg via ORAL

## 2017-03-22 MED ORDER — TRAMADOL 50 MG PO TAB
50-100 mg | ORAL_TABLET | ORAL | 0 refills | Status: CN | PRN
Start: 2017-03-22 — End: ?

## 2017-03-22 MED ORDER — OXYCODONE 5 MG PO TAB
5-10 mg | ORAL | 0 refills | Status: DC | PRN
Start: 2017-03-22 — End: 2017-03-22
  Administered 2017-03-22 (×2): 10 mg via ORAL

## 2017-03-22 MED ORDER — TRAMADOL 50 MG PO TAB
50 mg | ORAL | 0 refills | Status: DC | PRN
Start: 2017-03-22 — End: 2017-03-24
  Administered 2017-03-22 – 2017-03-24 (×3): 50 mg via ORAL

## 2017-03-22 MED ORDER — FENTANYL CITRATE (PF) 50 MCG/ML IJ SOLN
50 ug | Freq: Once | INTRAVENOUS | 0 refills | Status: CP
Start: 2017-03-22 — End: ?
  Administered 2017-03-23: 01:00:00 50 ug via INTRAVENOUS

## 2017-03-22 MED ORDER — OXYCODONE 5 MG PO TAB
5-15 mg | ORAL | 0 refills | Status: DC | PRN
Start: 2017-03-22 — End: 2017-03-25
  Administered 2017-03-23 (×3): 15 mg via ORAL

## 2017-03-22 MED ORDER — POTASSIUM CHLORIDE 10 MEQ PO TBTQ
10 meq | Freq: Once | ORAL | 0 refills | Status: CP
Start: 2017-03-22 — End: ?
  Administered 2017-03-22: 15:00:00 10 meq via ORAL

## 2017-03-22 MED ORDER — OXYCODONE 5 MG PO TAB
5-10 mg | ORAL_TABLET | ORAL | 0 refills | Status: CN | PRN
Start: 2017-03-22 — End: ?

## 2017-03-22 NOTE — Operative Report(Direct Entry)
OPERATIVE REPORT    Name: Alicia Fox is a 67 y.o. female     DOB: 1951-02-16             MRN#: [id number]    DATE OF OPERATION: 03/21/2017    Preoperative Dx:   Left lower lobe carcinoid tumor  ???  Post-op Diagnosis   Left lower lobe carcinoid tumor  ???  Procedure:  1.  Robotic assisted left lower lobectomy  2.  Mediastinal lymph node dissection  3.  Regional intercostal and serratus anterior nerve block  4.  Bronchoscopy  ???  Anesthesia Type: General   ???  Surgeon(s) and Role:     * Particia Nearing, MD - Primary     * Almoghrabi, Kandis Mannan, MD - Fellow  ???  Assistant:  Starlyn Skeans, PA-C  ??????  Findings:  Left lower lobe carcinoid    Indications for operation: Alicia Fox is a 67 year old female with biopsy-proven left lower lobe carcinoid.  Patient understood the risk, benefits, and alternatives associated with surgical intervention.  Patient agreed to proceed with the procedure.    Description and Findings of Operative Procedure: Patient was identified in the preoperative waiting holding area.  Patient was taken operating room and placed in a supine position.  General anesthesia was administered in the usual fashion with a double-lumen endotracheal tube.  At this time a timeout was performed that deemed the patient and procedure to be correct.  Patient did receive perioperative antibiotics and DVT prophylaxis is indicated.  We began with the flexible bronchoscopy demonstrating no endobronchial lesions.    The patient was then placed in the right lateral decubitus position with the left side up.  Patient was then prepped and draped in the appropriate sterile fashion.  We began with a 8 mm eighth interspace port.  This was placed after single lung ventilation was confirmed.  Under direct visualization 2 additional posterior ports were placed along with and 2 anterior ports.  At this point the [employer] I robot was then docked.  Instrumentation was passed into the field.  We began with mobilization inferior pulmonary ligament to the inferior pulmonary vein.  The inferior pulmonary vein was then mobilized in the medial aspect towards the hilum.  The lung was then reflected anteriorly and the pleura was mobilized apically towards the apex of the lung.  At this point were able to circumventing mobilized the inferior pulmonary vein.  We then exposed the interlobar fissure.  Using a combination of blunt and electrocautery dissection were able to mobilize the continuing interlobar pulmonary artery.  At this point we able to circumvent to mobilize the superior segmental artery.  This was taken using a vascular load stapler.  We then were able to circumferentially mobilized the basilar segment artery.  This was also taken using a vascular load stapler.  We then completed the anterior fissure using bipolar dissection.  At this point the lung was reflected superiorly and we circumferentially mobilized the inferior pulmonary vein.  Using multiple fires of a vascular stapler the inferior primary vein was transected.  At this point we were able to mobilize the lower lobe bronchus.  The lower lobe bronchus was then clamped.  Insufflation demonstrated insufflation of the upper lobe.  We then completely transected the lower lobe bronchus using parenchymal load firing stapler.  We then performed a mediastinal lymph node dissection there were no identified lymph nodes in level 9 or 7.  We did obtain a level 10 lymph nodes.  A regional intercostal nerve block and serratus anterior nerve block were placed under direct visualization.  Adequate hemostasis was achieved.  A 28 French chest tube was placed under direct visualization.  The lung was insufflated.  His rotation was removed.  All incisions were closed multiple layers.  Dermabond was sterilely applied.  All instruments, needle, and sponge counts were correct x2 at the end of the case.    Estimated Blood Loss:  50 ml    Specimen(s) Removed/Disposition: ID Type Source Tests Collected by Time Destination   1 : Level 10 lymph node - Permanent Tissue Lymph Node (Specify) SURGICAL PATHOLOGY          Particia Nearing, MD 03/21/2017 [phone]    2 : Left lower lobe (permanent) Tissue Lung LLL SURGICAL PATHOLOGY          Particia Nearing, MD 03/21/2017 1059        Attestation: I was present for the entire procedure. and I personally performed this procedure with a Physician Assistant as the surgical assistant, because there was no qualified resident available.    Particia Nearing, MD  Pager

## 2017-03-22 NOTE — Progress Notes
Assumed pt care at 1900. SR1*BBB on telemetry. VSS per trend, see doc flow. 6/10 incision pain reported. Med given per order. Surgical incisions CDI. CT -20 with air leak, team notified. No other event at night. Call light within reach. No further needs at this time. Will continue to monitor.

## 2017-03-22 NOTE — Progress Notes
Assumed care at 0730.  See flowsheet for assessment.  VSS, SR on tele.  CT to water seal, no air leak noted.  Up in room and ambulated in hallway, tolerated fair.  Using IS frequently as instructed.  Foley out this am, voiding well.  Reports adequate pain relief on current regimen.  Cont to monitor, encourage activity.

## 2017-03-22 NOTE — Anesthesia Pain Rounding
Anesthesia Follow-Up Evaluation: Post-Procedure Day One    Name: Alicia Fox     MRN: [id number]     DOB: 09/11/1950     Age: 67 y.o.     Sex: female   __________________________________________________________________________     Procedure Date: 03/21/2017   Procedure: Procedure(s):  ROBOTIC LEFT LOWER LOBECTOMY    Physical Assessment  Height: 162.6 cm (64)  Weight: 123.2 kg (271 lb 9.7 oz)    Vital Signs (Last Filed in 24 hours)  BP: 126/68 (01/19 0800)  Temp: 36.7 ???C (98.1 ???F) (01/19 0759)  Pulse: 94 (01/19 0800)  Respirations: 16 PER MINUTE (01/19 0450)  SpO2: 96 % (01/19 0800)  O2 Delivery: Nasal Cannula (01/18 1600)  SpO2 Pulse: 90 (01/19 0800)    Patient History   Allergies  Allergies   Allergen Reactions   ??? Codeine HEADACHE and ITCHING        Medications  Scheduled Meds:  acetaminophen (TYLENOL) tablet 1,000 mg 1,000 mg Oral Q6H while awake   Or      acetaminophen (TYLENOL) oral solution 1,000 mg 1,000 mg Per NG tube Q6H while awake   amLODIPine (NORVASC) tablet 2.5 mg 2.5 mg Oral BID   aspirin EC tablet 81 mg 81 mg Oral QDAY   atorvastatin (LIPITOR) tablet 10 mg 10 mg Oral QHS   cetirizine (ZYRTEC) tablet 10 mg 10 mg Oral QDAY   famotidine (PEPCID) tablet 20 mg 20 mg Oral BID   Or      famotidine (PEPCID) injection 20 mg 20 mg Intravenous BID   furosemide (LASIX) tablet 20 mg 20 mg Oral QDAY   heparin (porcine) PF syringe 5,000 Units 5,000 Units Subcutaneous TID   hydroCHLOROthiazide (HYDRODIURIL) tablet 25 mg 25 mg Oral QDAY   levothyroxine (SYNTHROID) tablet 88 mcg 88 mcg Oral QDAY 30 min before breakfast   losartan (COZAAR) tablet 100 mg 100 mg Oral BID   milk of magnesia (CONC) oral suspension 10 mL 10 mL Oral BID   pantoprazole DR (PROTONIX) tablet 40 mg 40 mg Oral QDAY(21)   polyethylene glycol 3350 (MIRALAX) packet 17 g 1 packet Oral BID   senna/docusate (SENOKOT-S) tablet 2 tablet 2 tablet Oral BID   Or      senna/docusate (SENOKOT-S) solution 20 mL 20 mL Per NG tube BID   Continuous Infusions: ??? lactated ringers infusion 50 mL/hr at 03/21/17 1247     PRN and Respiratory Meds:acetaminophen Q6H PRN **OR** acetaminophen Q6H PRN, alum/mag hydroxide/simeth Q4H PRN, bisacodyl QDAY PRN, diphenhydrAMINE Q4H PRN, hydrALAZINE Q6H PRN, naloxone PRN, ondansetron Q6H PRN **OR** ondansetron (ZOFRAN) IV Q6H PRN, oxyCODONE Q4H PRN, potassium chloride SR PRN **OR** potassium chloride PRN **OR** potassium chloride in water PRN, traMADol Q6H PRN      Diagnostic Tests  Hematology: Lab Results   Component Value Date    HGB 10.1 03/22/2017    HCT 31.7 03/22/2017    PLTCT 204 03/22/2017    WBC 10.0 03/22/2017    NEUT 70 01/17/2017    ANC 5.50 01/17/2017    ALC 1.80 01/17/2017    MONA 6 01/17/2017    AMC 0.50 01/17/2017    EOSA 2 01/17/2017    ABC 0.00 01/17/2017    MCV 73.5 03/22/2017    MCH 23.4 03/22/2017    MCHC 31.8 03/22/2017    MPV 8.5 03/22/2017    RDW 14.3 03/22/2017         General Chemistry: Lab Results   Component Value Date    NA  136 03/22/2017    K 3.9 03/22/2017    CL 101 03/22/2017    CO2 29 03/22/2017    GAP 6 03/22/2017    BUN 22 03/22/2017    CR 0.55 03/22/2017    GLU 189 03/22/2017    CA 9.3 03/22/2017    ALBUMIN 4.2 02/27/2017    TOTBILI 0.4 02/27/2017      Coagulation:   Lab Results   Component Value Date    PTT 27.2 02/27/2017    INR 0.9 02/27/2017         Follow-Up Assessment  Patient location during evaluation: floor      Anesthetic Complications:   Anesthetic complications: The patient did not experience any anesthestic complications.      Pain:    Management:adequate (dilaudid PCA)     Level of Consciousness: awake   Hydration:acceptable     Airway Patency: patent   Respiratory Status: acceptable, room air and spontaneous ventilation     Cardiovascular Status:acceptable and hemodynamically stable   Regional/Neuroaxial:

## 2017-03-22 NOTE — Progress Notes
RT Adult Assessment Note    NAME:Simrit DYNESHIA BACCAM             MRN: [id number]             DOB:December 15, 1950          AGE: 67 y.o.  ADMISSION DATE: 03/21/2017             DAYS ADMITTED: LOS: 0 days    RT Treatment Plan:       Protocol Plan: Procedures  Vest Airway Clearance: Q4h & PRN  PEP Therapy: Place a nursing order for "IS Q1h While Awake" for any of Lung Expansion indicators  PAP: Q4h PAP While Awake  Oxygen/Humidity: O2 to keep SpO2 > 95%(post-op)  Monitoring: Pulse oximetry Q6h & PRN    Additional Comments:  Impressions of the patient: NAD, sitting in bed comfortable, on 1.5L NC  Patient education that was completed: Vest, and PAP therap    Vital Signs:  Pulse: 95  Respirations: 15 PER MINUTE  SpO2: 92 %  O2 Device: Cannula  O2 Liter Flow: 1.5 lpm  Respiratory Effort: Non-Labored

## 2017-03-23 ENCOUNTER — Inpatient Hospital Stay: Admit: 2017-03-23 | Discharge: 2017-03-23 | Payer: MEDICARE

## 2017-03-23 DIAGNOSIS — D3A090 Benign carcinoid tumor of the bronchus and lung: Principal | ICD-10-CM

## 2017-03-23 LAB — BASIC METABOLIC PANEL: Lab: 137 MMOL/L — ABNORMAL LOW (ref 137–147)

## 2017-03-23 LAB — CBC: Lab: 9 K/UL — ABNORMAL LOW (ref >60)

## 2017-03-23 MED ORDER — FUROSEMIDE 10 MG/ML IJ SOLN
20 mg | Freq: Once | INTRAVENOUS | 0 refills | Status: CP
Start: 2017-03-23 — End: ?
  Administered 2017-03-23: 13:00:00 20 mg via INTRAVENOUS

## 2017-03-23 NOTE — Progress Notes
Cardiothoracic Surgery Progress Note 03/23/2017     Patient: Alicia Fox  MRN: [id number]    Admission date 03/21/2017, LOS: 2 days     ASSESSMENT:  Alicia Fox is a 67 y.o. 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     PLAN:     -Cardiac diet  -Tylenol and oxycodone prn   -Will d/c chest tube today; encourage IS/pulm hygiene and ambulation  -Wean O2  -Bowel regimen  -PTA meds: lasix, synthroid aspirin, norvasc, HCTZ, losartan and protonix  -SCDs and heparin for prophylaxis   -Continue floor care  -Anticipate d/c home tomorrow    Dione Booze MD  ________________________________________________________________________   SUBJECTIVE:  No events overnight. Pain at chest tube site; relatively well controlled with po meds.       OBJECTIVE:                   Vital Signs: Last Filed                Vital Signs: 24 Hour Range   BP: 124/57 (01/20 0736)  Temp: 37.1 C (98.7 F) (01/20 0736)  Pulse: 98 (01/20 0736)  Respirations: 17 PER MINUTE (01/20 0736)  SpO2: 95 % (01/20 0736)  O2 Delivery: Nasal Cannula (01/20 0736)  Weight: 128.5 kg (283 lb 6.4 oz) (01/20 0402)  BP: (113-138)/(54-68)   Temp:  [36.7 C (98 F)-37.2 C (98.9 F)]   Pulse:  [91-98]   Respirations:  [16 PER MINUTE-17 PER MINUTE]   SpO2:  [92 %-98 %]   O2 Delivery: Nasal Cannula   Intensity Pain Scale (Self Report): (not recorded)      A&O, NAD  CTAB, no R/R/W, unlabored, L CT in place with s/s drainage, no air leak present.  Dressing c/d/i  RRR  Abd Soft/NT/ND/+BS  No C/C/E      Intake/Output Summary (Last 24 hours) at 03/23/2017 1014  Last data filed at 03/23/2017 0725  Gross per 24 hour   Intake 2480 ml   Output 1310 ml   Net 1170 ml

## 2017-03-23 NOTE — Progress Notes
Assumed pt care at 1900. SR1*BBB on telemetry. VSS per trend, see doc flow. Incision pain reported. Med given per order. Surgical incisions CDI. CT to [facility]. 2L NC. No other event at night. Call light within reach. No further needs at this time. Will continue to monitor.

## 2017-03-23 NOTE — Progress Notes
RT Adult Assessment Note    NAME:Alicia Fox             MRN: [id number]             DOB:June 23, 1950          AGE: 67 y.o.  ADMISSION DATE: 03/21/2017             DAYS ADMITTED: LOS: 1 day    RT Treatment Plan:       Protocol Plan: Procedures  Vest Airway Clearance: Q4h While Awake  PAP: Q4h PAP While Awake  Oxygen/Humidity: O2 to keep SpO2 > 92%  Monitoring: Pulse oximetry BID & PRN    Additional Comments:  Impressions of the patient: No SOA  Intervention(s)/outcome(s): Vest QID, PEP/PAP QID  Patient education that was completed: Vest, lung expanasion  Recommendations to the care team: continue post cardiothoracic protocol    Vital Signs:  Pulse: Pulse: 98  RR: Respirations: 16 PER MINUTE  SpO2: SpO2: 97 %  O2 Device: $$ O2 Device: Cannula  Liter Flow: O2 Liter Flow: 2 lpm  O2%:    Breath Sounds: All Breath Sounds: Clear (implies normal);Decreased  Respiratory Effort:

## 2017-03-23 NOTE — Progress Notes
Assumed care at 0730.  See flowsheet for assessment.  CT to water seal. 40 ml drainage.  C/o pain left chest area, med with some relief.  Nausea after coughing, med with zofran.  Using IS occasionally, increased use encouraged.

## 2017-03-23 NOTE — Progress Notes
Ambulated several times in room, X1 in hallway.  CT to water seal, 51ml, 50 ml, 60 ml ouput.  Using IS well.  Pt c/o escalating pain around left CT/incision at this time; requiring 15mg  oxycodone q4h and tramadol; too early for additional dose of either.  Discussed with Dr. Collene Mares and orders rec'd.

## 2017-03-23 NOTE — Progress Notes
RT Adult Assessment Note    NAME:Alicia Fox             MRN: [id number]             DOB:1950/10/18          AGE: 67 y.o.  ADMISSION DATE: 03/21/2017             DAYS ADMITTED: LOS: 2 days    RT Treatment Plan:       Protocol Plan: Procedures  Vest Airway Clearance: Discontinued  PAP: Place a nursing order for "IS Q1h While Awake" for any of Lung Expansion indicators  IPPB: Place a nursing order for "IS Q1h While Awake" for any of Lung Expansion indicators  Oxygen/Humidity: O2 to keep SpO2 > 92%  Monitoring: Pulse oximetry BID & PRN    Additional Comments:  Impressions of the patient: just returned from showering, very tired, had difficulty staying awake.  Intervention(s)/outcome(s): continue IS/PEP Q1WA self    Patient education that was completed: n/a   Recommendations to the care team: continue to wean O2, consider overnight oximetry in the short term &consider sleep study in the long term.    Vital Signs:  Pulse:    RR:    SpO2: SpO2: 94 %  O2 Device: $$ O2 Device: Cannula  Liter Flow: O2 Liter Flow: 2 lpm  O2%:    Breath Sounds:    Respiratory Effort: Respiratory Effort: Non-Labored

## 2017-03-24 DIAGNOSIS — D3A090 Benign carcinoid tumor of the bronchus and lung: Principal | ICD-10-CM

## 2017-03-24 LAB — BASIC METABOLIC PANEL: Lab: 134 MMOL/L — ABNORMAL LOW (ref >60)

## 2017-03-24 LAB — CBC: Lab: 8.1 K/UL — ABNORMAL LOW (ref 4.5–11.0)

## 2017-03-24 MED ORDER — TRAMADOL 50 MG PO TAB
50-100 mg | ORAL | 0 refills | Status: DC | PRN
Start: 2017-03-24 — End: 2017-03-25
  Administered 2017-03-24 – 2017-03-25 (×5): 50 mg via ORAL

## 2017-03-24 MED ORDER — ACETAZOLAMIDE SODIUM 500 MG IJ SOLR
500 mg | Freq: Once | INTRAVENOUS | 0 refills | Status: CP
Start: 2017-03-24 — End: ?
  Administered 2017-03-24: 15:00:00 500 mg via INTRAVENOUS

## 2017-03-24 NOTE — Progress Notes
Assumed pt care at 1900. SRon telemetry. VSS per trend, see doc flow.Incisionpain and temp reported. Med given per order. Surgicalincisions CDI. 2L NC. Pt has a lower left pneumothorax, per radiology. Team notified Dagoberto Ligas, MD). No other event at night. Call light within reach.No further needs at this time. Will continue to monitor.

## 2017-03-24 NOTE — Progress Notes
RT Exercise Oximetry Note    NAME:[name]                                                           MRN: [id number]                 DOB:06-08-1950            AGE: 67 y.o.  ADMISSION DATE: 03/21/2017             DAYS ADMITTED: LOS: 3 days      Date performed: 03/24/17  Time performed: 1500  Time walked: 10 minutes    At Rest While Awake Results    Room air at rest while awake:  SpO2 = 87-90% variable.     Oxygen dose required at rest while awake: 1 lpm   SpO2 at 1 liters per minute = 90%        With Exercise Results:    Room air SpO2 with exercise, if needed: 85%    Oxygen required with exercise: 3 lpm with resulting SpO2 90%  SpO2 at 1 liters per minute = 86%  SpO2 at 2 liters per minute = 87%  SpO2 at 3 liters per minute = 90%    Comments: When at rest, patient's SpO2 would swing from > 89% to < 89% for a reasonable amount of time. At this time, I would recommend 1lpm at rest AS NEEDED; Patient did very well walking; patient completed a full lap around the unit using on the assistance of a walker; patient did require 3lpm during her walk to maintain acceptable SpO2. See above.   Patient was placed back in her room safely on 1lpm for comfort at this time.    Therapist: Harrison Mons, RT

## 2017-03-24 NOTE — Progress Notes
Cont to require O2 1L.  Using IS frequently up to 1057ml.  Occ productive cough.    Has ambulated laps X 4.  Tolerated fair but some dypnea on exertion.  VSS, SR on tele.  Incision c/d/i.  Voiding well.   Requests to not take oxycodone, states her "head feels clearer".  Has taken tramadol and tylenol with adequate pain control.

## 2017-03-24 NOTE — Progress Notes
Assumed care at 0730.  See flowsheet for assessment.  Coughing moderate amt sputum, yellow, blood-tinged.  Breathing somewhat labored with exertion, fatigues easily with ambulation, was able to increase distance this am.  Using IS frequently up to 1057ml.

## 2017-03-24 NOTE — Progress Notes
Cardiothoracic Surgery Progress Note 03/24/2017     Patient: Alicia Fox  MRN: [id number]    Admission date 03/21/2017, LOS: 3 days     ASSESSMENT:  Alicia Fox is a 67 y.o. 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     PLAN:     -Cardiac diet  -Tylenol and oxycodone prn   - encourage IS/pulm hygiene and ambulation  -Bowel regimen  -PTA meds: lasix, synthroid aspirin, norvasc, HCTZ, losartan and protonix  -SCDs and heparin for prophylaxis   -Continue floor care  -Anticipate home tomorrow  -continue diuresis, wean O2. Monitor bicarb, will likely give diamox   -CXR pending   -monitor temperature. WBC WNL.   -restart vest therapy      Dyann Ruddle,  MD  ________________________________________________________________________   SUBJECTIVE:  Felt febrile, tmax 38.1 otherwise denies any n/v/sob. Chest pain controlled.     OBJECTIVE:                   Vital Signs: Last Filed                Vital Signs: 24 Hour Range   BP: 130/47 (01/21 0300)  Temp: 37.9 ???C (100.2 ???F) (01/21 0537)  Pulse: 103 (01/21 0300)  Respirations: 18 PER MINUTE (01/21 0300)  SpO2: 92 % (01/21 0300)  O2 Delivery: Nasal Cannula (01/21 0300)  Weight: 127.8 kg (281 lb 12.8 oz) (01/21 0300)  BP: (105-130)/(46-57)   Temp:  [37 ???C (98.6 ???F)-38.1 ???C (100.6 ???F)]   Pulse:  [93-103]   Respirations:  [16 PER MINUTE-18 PER MINUTE]   SpO2:  [88 %-100 %]   O2 Delivery: Nasal Cannula   Intensity Pain Scale (Self Report): (not recorded)      A&O, NAD  CTAB, no R/R/W, unlabored, L CT in place with s/s drainage, no air leak present.  Dressing c/d/i  RRR  Abd Soft/NT/ND/+BS  No C/C/E      Intake/Output Summary (Last 24 hours) at 03/24/2017 1914  Last data filed at 03/24/2017 0615  Gross per 24 hour   Intake 1250 ml   Output 1440 ml   Net -190 ml   Stool Occurrence: 1      24-hour labs: Results for orders placed or performed during the hospital encounter of 03/21/17 (from the past 24 hour(s))   CBC    Collection Time: 03/24/17  4:27 AM   Result Value Ref Range    White Blood Cells 8.1 4.5 - 11.0 K/UL    RBC 4.21 4.0 - 5.0 M/UL    Hemoglobin 10.0 (L) 12.0 - 15.0 GM/DL    Hematocrit 78.2 (L) 36 - 45 %    MCV 73.2 (L) 80 - 100 FL    MCH 23.8 (L) 26 - 34 PG    MCHC 32.5 32.0 - 36.0 G/DL    RDW 95.6 11 - 15 %    Platelet Count 212 150 - 400 K/UL    MPV 8.4 7 - 11 FL   BASIC METABOLIC PANEL    Collection Time: 03/24/17  4:27 AM   Result Value Ref Range    Sodium 134 (L) 137 - 147 MMOL/L    Potassium 4.0 3.5 - 5.1 MMOL/L    Chloride 96 (L) 98 - 110 MMOL/L    CO2 34 (H) 21 - 30 MMOL/L    Anion Gap 4 3 -  12    Glucose 142 (H) 70 - 100 MG/DL    Blood Urea Nitrogen 17 7 - 25 MG/DL    Creatinine 4.13 0.4 - 1.00 MG/DL    Calcium 9.5 8.5 - 24.4 MG/DL    eGFR Non African American >60 >60 mL/min    eGFR African American >60 >60 mL/min

## 2017-03-24 NOTE — Progress Notes
RT Adult Assessment Note    NAME:Alicia Fox             MRN: [id number]             DOB:[birth year]          AGE: 67 y.o.  ADMISSION DATE: 03/21/2017             DAYS ADMITTED: LOS: 3 days    RT Treatment Plan:       Protocol Plan: Procedures  Vest Airway Clearance: Q6h & PRN(physician requested vest therapy and remain on RT Protocol)  PAP: Place a nursing order for "IS Q1h While Awake" for any of Lung Expansion indicators(see physician order for Q6)  Oxygen/Humidity: O2 to keep SpO2 > 92%  Monitoring: Pulse oximetry BID & PRN    Additional Comments:  Impressions of the patient: pt. doing well lying in bed at this time; patient motivated to do whatever it takes to get her better and home tomorrow in good condition;   Intervention(s)/outcome(s): see above; physician requested the re-start of VEST and PAP therapy through tomorrow to encourage patient's maximum healing potential as fast as possible  Patient education that was completed: pt. encouraged to continue walking and practicing IS and PEP devices  Recommendations to the care team: An overnight oximetry might not be a bad idea, considing patient did qualify for oxygen today during rest evident by the exercise oximetry    Vital Signs:  Pulse:    RR:    SpO2:    O2 Device:    Liter Flow:    O2%:    Breath Sounds:    Respiratory Effort: Respiratory Effort: Non-Labored

## 2017-03-24 NOTE — Progress Notes
VSS, SR on tele.  Voiding adequately.  No BM yet, passing flatus, abd soft.  Poor appetite. Med X1 for nausea this am.    Much less pain since CT removed.  Cont to require O2 2L, desats on RA.  Using IS up to 1038ml.  Ambulated in hallway, tolerated well.  C/o feeling very tired, sleepy today.  Has not required pain meds since early am.  Cont to monitor, encourage activity.

## 2017-03-25 ENCOUNTER — Inpatient Hospital Stay: Admit: 2017-03-24 | Discharge: 2017-03-24 | Payer: MEDICARE

## 2017-03-25 ENCOUNTER — Inpatient Hospital Stay: Admit: 2017-03-21 | Discharge: 2017-03-21 | Payer: MEDICARE

## 2017-03-25 ENCOUNTER — Inpatient Hospital Stay: Admit: 2017-02-27 | Discharge: 2017-02-27 | Payer: MEDICARE

## 2017-03-25 ENCOUNTER — Inpatient Hospital Stay: Admit: 2017-03-23 | Discharge: 2017-03-23 | Payer: MEDICARE

## 2017-03-25 ENCOUNTER — Inpatient Hospital Stay: Admit: 2017-03-22 | Discharge: 2017-03-22 | Payer: MEDICARE

## 2017-03-25 ENCOUNTER — Inpatient Hospital Stay: Admit: 2017-03-25 | Discharge: 2017-03-25 | Payer: MEDICARE

## 2017-03-25 DIAGNOSIS — Z87891 Personal history of nicotine dependence: ICD-10-CM

## 2017-03-25 DIAGNOSIS — I5032 Chronic diastolic (congestive) heart failure: ICD-10-CM

## 2017-03-25 DIAGNOSIS — M47812 Spondylosis without myelopathy or radiculopathy, cervical region: ICD-10-CM

## 2017-03-25 DIAGNOSIS — M1990 Unspecified osteoarthritis, unspecified site: ICD-10-CM

## 2017-03-25 DIAGNOSIS — D3A090 Benign carcinoid tumor of the bronchus and lung: Principal | ICD-10-CM

## 2017-03-25 DIAGNOSIS — Z90710 Acquired absence of both cervix and uterus: ICD-10-CM

## 2017-03-25 DIAGNOSIS — K219 Gastro-esophageal reflux disease without esophagitis: ICD-10-CM

## 2017-03-25 DIAGNOSIS — I110 Hypertensive heart disease with heart failure: ICD-10-CM

## 2017-03-25 DIAGNOSIS — Z853 Personal history of malignant neoplasm of breast: ICD-10-CM

## 2017-03-25 DIAGNOSIS — Z9013 Acquired absence of bilateral breasts and nipples: ICD-10-CM

## 2017-03-25 DIAGNOSIS — E669 Obesity, unspecified: ICD-10-CM

## 2017-03-25 DIAGNOSIS — Z6841 Body Mass Index (BMI) 40.0 and over, adult: ICD-10-CM

## 2017-03-25 DIAGNOSIS — F329 Major depressive disorder, single episode, unspecified: ICD-10-CM

## 2017-03-25 DIAGNOSIS — E785 Hyperlipidemia, unspecified: ICD-10-CM

## 2017-03-25 DIAGNOSIS — E039 Hypothyroidism, unspecified: ICD-10-CM

## 2017-03-25 LAB — BASIC METABOLIC PANEL
Lab: 134 MMOL/L — ABNORMAL LOW (ref >60)
Lab: 3.6 MMOL/L — ABNORMAL LOW (ref >60)
Lab: 99 MMOL/L — ABNORMAL LOW (ref 98–110)

## 2017-03-25 LAB — CBC: Lab: 9 K/UL — ABNORMAL LOW (ref >60)

## 2017-03-25 MED ORDER — OXYCODONE 5 MG PO TAB
5 mg | ORAL_TABLET | ORAL | 0 refills | 6.00000 days | Status: AC | PRN
Start: 2017-03-25 — End: 2017-03-25

## 2017-03-25 MED ORDER — POLYETHYLENE GLYCOL 3350 17 GRAM PO PWPK
17 g | Freq: Two times a day (BID) | ORAL | 0 refills | 22.00000 days | Status: DC
Start: 2017-03-25 — End: 2017-04-15

## 2017-03-25 MED ORDER — ACETAMINOPHEN 500 MG PO TAB
1000 mg | ORAL | 0 refills | Status: DC
Start: 2017-03-25 — End: 2017-03-25

## 2017-03-25 MED ORDER — MAGNESIUM HYDROXIDE 2,400 MG/10 ML PO SUSP
10 mL | Freq: Two times a day (BID) | ORAL | 0 refills | 2.00000 days | Status: DC
Start: 2017-03-25 — End: 2017-04-15

## 2017-03-25 MED ORDER — OXYCODONE 5 MG PO TAB
5 mg | ORAL_TABLET | ORAL | 0 refills | 6.00000 days | Status: AC | PRN
Start: 2017-03-25 — End: 2017-04-15

## 2017-03-25 MED ORDER — TRAMADOL 50 MG PO TAB
50-100 mg | ORAL_TABLET | ORAL | 0 refills | Status: AC | PRN
Start: 2017-03-25 — End: 2017-05-27

## 2017-03-25 MED ORDER — ACETAMINOPHEN 500 MG PO TAB
1000 mg | ORAL | 0 refills | Status: AC | PRN
Start: 2017-03-25 — End: 2018-09-23

## 2017-03-25 MED ORDER — TRAMADOL 50 MG PO TAB
50-100 mg | ORAL_TABLET | ORAL | 0 refills | Status: AC | PRN
Start: 2017-03-25 — End: 2017-03-25

## 2017-03-25 NOTE — Progress Notes
Patient Name: Alicia Fox  Date of Birth: 14-Jun-1950  MRN: [id number]    Overnight Oximetry Note    Date test was started: 03/24/2017  Time test was started: 22:20  Date test was completed: 03/25/2017  Time test was completed: 05:35    Comments:      Patient was placed on 1 liters of oxygen at 22:30 time.

## 2017-03-25 NOTE — Progress Notes
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]    Mobility  Progressive Mobility Level: Walk in hallway  Distance Walked (feet): 150 ft  Level of Assistance: Assist X1  Assistive Device: None  Time Tolerated: 11-30 minutes  Activity Limited By: Fatigue    Subjective  Pertinent Dx per Physician: s/p Robotic assisted left lower lobectomy  Precautions: Falls;O2 Requirement  Pain / Complaints: Patient agrees to participate in therapy    Objective  Psychosocial Status: Willing and Cooperative to Participate  Persons Present: Physical Therapist;Student    Home Living  Type of Home: House(basement apartment)  Financial risk analyst / Tub: Pension scheme manager: Standard  Home Equipment: Medical laboratory scientific officer    Prior Function  Level Of Independence: Independent with ADLs and functional transfers;Needed assistance with homemaking  Lives With: Daughter  Receives Help From: None Needed  Other Function Comments: Patient reports daughter provides assist for all IADLs. Daughter has pnemonia and 2 rib fractures-unable to provide prior level of assistance.    ADL's  Where Assessed: Edge of Bed  LE Dressing Assist: Stand By Assist  LE Dressing Deficits: Supervision/Safety;Don/Doff L Sock  Functional Transfer Assist: Stand By Assist  Functional Transfer Deficits: Supervision/Safety;Increased Time to Complete  Comment: Patient doffed/donned sock while seated EOB with stand by assist for safety.     Activity Tolerance  Endurance: 2/5 Tolerates 10-20 Minutes Exercise w/Multiple Rests  Sitting Balance: 4+/5 Moves/Returns Trunkal Midpoint 1-2 Inches in Multiple Planes    Cognition  Overall Cognitive Status: WFL to Adequately Complete Self Care Tasks Safely    Education  Persons Educated: Patient  Teaching Methods: Verbal Instruction  Patient Response: Verbalized Understanding  Topics: Role of OT, Goals for Therapy Goal Formulation: With Patient    Assessment  Assessment: Decreased Endurance;Decreased High-Level ADLs  Prognosis: Good  Goal Formulation: Patient  Comments: Patient with decreased endurance needed for performing ADLs. Anticipate continued progress towards prior level of function with continued mobility/activity.    AM-PAC 6 Clicks Daily Activity Inpatient  Putting on and taking off regular lower body clothes?: None  Bathing (Including washing, rinsing, drying): A Little  Toileting, which includes using toilet, bedpan, or urinal: None  Putting on and taking off regular upper body clothing: None  Taking care of personal grooming such as brushing teeth: None  Eating meals?: None  Daily Activity Raw Score: 23  Standardized (t-scale) score: 51.12  CMS 0-100% Score: 15.86  CMS G Code Modifier: CI    Plan  Progress: Progressing Toward Goals  OT Frequency: 2-3x/week  OT Plan for Next Visit: Standing endurance for grooming and bathing.    ADL Goals  Patient Will Perform Grooming: Standing at Sink;Independently  Patient Will Perform Bathing: Independently    OT Discharge Recommendations  OT Discharge Recommendations: Home with Home Health, Home with family assist, Vs, Inpatient Setting. Anticipate patient will progress well and be safe for home discharge. Currently limited by: poor endurance needed for performing ADLs safely. Patient's daughter unable to provide prior level of assist for IADLs. Will continue to assess and update recommendations.   Equipment Recommendations: Shower Chair    Therapist: Azucena Kuba, OTR/L  Date: 03/25/2017

## 2017-03-25 NOTE — Progress Notes
1445 - Reviewed discharge instructions, prescriptions/medications, follow-up appt with pt.  Pt states verbal understanding - no questions.  IV and Tele dc'd.  Interfacility transfer packet complete and given to pt as her daughter will provide transportation to facility this afternoon.  Narcotic prescriptions inside of packet.  Loaner oxygen tank in room for trip to facility - pt states verbal understanding to bring tank back to [facility] at follow-up appt.  When ready, pt taken to the front door by hospital staff.

## 2017-03-25 NOTE — Progress Notes
Cardiothoracic Surgery Progress Note 03/25/2017     Patient: Alicia Fox  MRN: [id number]    Admission date 03/21/2017, LOS: 4 days     ASSESSMENT:  Alicia Fox is a 67 y.o. 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     PLAN:     -Cardiac diet  -Tylenol, oxycodone and tramadol prn for pain control   -Encourage IS/pulm hygiene and ambulation  -CXR pending. Chest tube removed 1/20  -Bowel regimen  -PTA meds: lasix, synthroid aspirin, norvasc, HCTZ, losartan and protonix  -Afebrile, No leukocytosis   -Received diamox yesterday. Bicarb improved today 31 (34)  -SCDs and heparin for prophylaxis   -Overnight pulse oximetry test completed yesterday- pt placed on NC 1L oxygen   -Possible dc today    [name], MD  Pager [phone]  ________________________________________________________________________   SUBJECTIVE:  IV infiltrated yesterday while receiving diamox. Initially left hand was swollen and painful but has since resolved. Pain better controlled. Denies n/v, sob or chest pain    OBJECTIVE:                   Vital Signs: Last Filed                Vital Signs: 24 Hour Range   BP: 130/67 (01/22 0323)  Temp: 36.6 ???C (97.9 ???F) (01/22 0323)  Pulse: 80 (01/22 0530)  Respirations: 18 PER MINUTE (01/22 0323)  SpO2: 94 % (01/22 0530)  O2 Delivery: Nasal Cannula (01/22 0323)  Weight: 124.6 kg (274 lb 9.6 oz) (01/22 0323)  BP: (105-132)/(53-77)   Temp:  [36.5 ???C (97.7 ???F)-37.9 ???C (100.2 ???F)]   Pulse:  [79-103]   Respirations:  [16 PER MINUTE-20 PER MINUTE]   SpO2:  [93 %-98 %]   O2 Delivery: Nasal Cannula   Intensity Pain Scale (Self Report): (not recorded)      A&O, NAD  CTAB, no R/R/W, unlabored, Incisions CDI. CT removed  RRR  Abd Soft/NT/ND  No C/C/E      Intake/Output Summary (Last 24 hours) at 03/25/2017 0615  Last data filed at 03/25/2017 0325  Gross per 24 hour   Intake 1250 ml   Output 3750 ml Net -2500 ml   Stool Occurrence: 1      24-hour labs:    Results for orders placed or performed during the hospital encounter of 03/21/17 (from the past 24 hour(s))   CBC    Collection Time: 03/25/17  4:31 AM   Result Value Ref Range    White Blood Cells 9.0 4.5 - 11.0 K/UL    RBC 4.37 4.0 - 5.0 M/UL    Hemoglobin 10.3 (L) 12.0 - 15.0 GM/DL    Hematocrit 45.4 (L) 36 - 45 %    MCV 73.4 (L) 80 - 100 FL    MCH 23.4 (L) 26 - 34 PG    MCHC 31.9 (L) 32.0 - 36.0 G/DL    RDW 09.8 11 - 15 %    Platelet Count 218 150 - 400 K/UL    MPV 8.3 7 - 11 FL   BASIC METABOLIC PANEL    Collection Time: 03/25/17  4:31 AM   Result Value Ref Range    Sodium 134 (L) 137 - 147 MMOL/L    Potassium 3.6 3.5 - 5.1 MMOL/L    Chloride 99 98 - 110 MMOL/L  CO2 31 (H) 21 - 30 MMOL/L    Anion Gap 4 3 - 12    Glucose 154 (H) 70 - 100 MG/DL    Blood Urea Nitrogen 12 7 - 25 MG/DL    Creatinine 1.61 0.4 - 1.00 MG/DL    Calcium 9.9 8.5 - 09.6 MG/DL    eGFR Non African American >60 >60 mL/min    eGFR African American >60 >60 mL/min

## 2017-03-25 NOTE — Progress Notes
A&Ox4. VSS. 1L NC. SR c 1*AVB on tele. Pain controlled with current PO pain regimen. Incisions CDI, bruising, soft to touch, sutures in place. 1+ assist, HFR bundle in place.

## 2017-03-25 NOTE — Progress Notes
Pt rested most of night.  SR on tele.  Vital signs per trend.  Pain controlled with prn tramadol.  Incisions approximated with no drainage.  CT exit with suture.  UOP adequate.  Denies needs at this time.  Will cont to monitor.

## 2017-03-25 NOTE — Progress Notes
PHYSICAL THERAPY  ASSESSMENT     MOBILITY:  Progressive Mobility Level: Walk in hallway  Distance Walked (feet): 150 ft  Level of Assistance: Assist X1  Assistive Device: None  Time Tolerated: 11-30 minutes  Activity Limited By: Fatigue    SUBJECTIVE:  Significant hospital events:  carcinoid tumor s/p robotic left lower lobectomy 1/18.     Mental / Cognitive Status: Alert;Oriented;Cooperative  Persons Present: Physical Therapist;Student  Pain: Patient has no complaint of pain  2 L oxygen via nasal cannula (new requirement does not wear at baseline)    Ambulation Assist: Independent Mobility in Community without Device  Patient Owned Equipment: Single [employer] (owns but did not consistently use, only when needed lumbar support)  Home Situation: Lives with Family(lives in lower level of daughters home)  Type of Home: House(basement apartment)  Entry Stairs: No Stairs(ground level enterance )  In-Home Stairs: 1-2 Flights of Stairs(to get to daughter's living area)    The patient notes daughter typically assists with meal preperations and laundry (currently unable to assist with these activites secondary to dx of broken ribs and pneumonia). She is concerned with ability to care for herself without daughter's assistance and does not feel she is at her baseline in regards to mobility.    BED MOBILITY/TRANSFERS:  Bed Mobility: Supine to Sit: Standby Assist;Use of Rail;Head of Bed Elevated(slightly)    Transfer Type: Sit to Stand  Transfer: Assistance Level: To/From;Bed;Standby Assist    GAIT:  Gait Distance: 150 feet (50 feet without use of assistive device, 100 feet with use of roller walker)    Ambulated 50 feet without use of assistive device with contact guard/minimal assist. The patient with wide base of support and frequently reaching for objects to steady self. With use of roller walker, the patient is standby assistance for safety.  The patient notes fatigue with ambulation, had ambulated with RN staff earlier.    EDUCATION:  Persons Educated: Patient  Patient Barriers To Learning: None Noted  Interventions: Repetition of Instructions  Teaching Methods: Verbal Instruction  Patient Response: Verbalized Understanding  Topics: Plan/Goals of PT Interventions;Use of Assistive Device/Orthosis;Safety Awareness;Importance of Increasing Activity    ASSESSMENT/PROGRESS:  Assessment/Progress: The patient currently with deceased endurance and activity tolerance - requires the use of assistive device and supplemental oxygen - was not using prior to admission.     AM-PAC 6 Clicks Basic Mobility Inpatient  Turning from your back to your side while in a flat bed without using bed rails: None  Moving from lying on your back to sitting on the side of a flatbed without using bedrails : None  Moving to and from a bed to a chair (including a wheelchair): None  Standing up from a chair using your arms (e.g. wheelchair, or bedside chair): None  To walk in hospital room: A Little  Climbing 3-5 steps with a railing: A Little  Raw Score: 22  Standardized (T-scale) Score: 47.4  Basic Mobility CMS 0-100%: 25.02  CMS G Code Modifier for Basic Mobility: CJ    GOALS:  Goal Formulation: With Patient  Time For Goal Achievement: 1 day, To, 3 days  Patient Will Transfer Sit to Stand: w/ Stand By Assist  Patient Will Ambulate: Greater than 200 Feet, w/ No Device, w/ Stand By Assist  Patient Will Go Up / Down Stairs: 6-10 Stairs, w/ Stand By Assist    PLAN:  Plan Frequency: 3-5 Days per Week  Plan for progression: Continue to work on weaning from assistive  device if patient remains in acute care setting, increase endurance    RECOMMENDATIONS:  PT Discharge Recommendations:Inpatient Setting vs. Home with assistance and home health     Equipment Recommendations: Roller Walker    Patient requires the use of a walker with wheels to complete ADL???s in the home including meal preparation, ambulation the bathroom for toileting, bathing and grooming, and safe home mobility.  Patient is unable to complete these ADL???s with a cane or crutch and can safely use the walker.       Therapist: Elvina Sidle, PT  Date: 03/25/2017

## 2017-03-25 NOTE — Case Management (ED)
This Probation officer delivered an O2 tank to the patient, per the request of Overton Mam, RNCM.  This Probation officer informed the nurse of the delivery.    Alicia Fox   Case Psychologist, prison and probation services  For additional assistance, please contact Overton Mam, [place].

## 2017-03-27 ENCOUNTER — Encounter: Admit: 2017-03-27 | Discharge: 2017-03-27 | Payer: MEDICARE

## 2017-03-27 LAB — BASIC METABOLIC PANEL
Lab: 0.6 — ABNORMAL LOW (ref 33.0–37.0)
Lab: 10 — ABNORMAL HIGH (ref 8.4–10.2)
Lab: 100
Lab: 12 — ABNORMAL LOW (ref 27.0–31.0)
Lab: 13
Lab: 137
Lab: 139 — ABNORMAL HIGH (ref 80–115)
Lab: 31 — ABNORMAL LOW (ref 80.0–99.0)

## 2017-03-27 LAB — PHOSPHORUS: Lab: 3.8 — ABNORMAL LOW (ref 12.0–16.0)

## 2017-03-27 LAB — MAGNESIUM: Lab: 2.1 — ABNORMAL LOW (ref 98–107)

## 2017-03-27 LAB — CBC: Lab: 8.7

## 2017-04-15 ENCOUNTER — Encounter: Admit: 2017-04-15 | Discharge: 2017-04-15 | Payer: MEDICARE

## 2017-04-15 ENCOUNTER — Ambulatory Visit: Admit: 2017-04-15 | Discharge: 2017-04-15 | Payer: MEDICARE

## 2017-04-15 DIAGNOSIS — D3A090 Benign carcinoid tumor of the bronchus and lung: Principal | ICD-10-CM

## 2017-04-15 DIAGNOSIS — R0609 Other forms of dyspnea: ICD-10-CM

## 2017-04-15 DIAGNOSIS — K929 Disease of digestive system, unspecified: ICD-10-CM

## 2017-04-15 DIAGNOSIS — M1990 Unspecified osteoarthritis, unspecified site: ICD-10-CM

## 2017-04-15 DIAGNOSIS — G4730 Sleep apnea, unspecified: ICD-10-CM

## 2017-04-15 DIAGNOSIS — R0600 Dyspnea, unspecified: ICD-10-CM

## 2017-04-15 DIAGNOSIS — F99 Mental disorder, not otherwise specified: ICD-10-CM

## 2017-04-15 DIAGNOSIS — C801 Malignant (primary) neoplasm, unspecified: ICD-10-CM

## 2017-04-15 DIAGNOSIS — G5603 Carpal tunnel syndrome, bilateral upper limbs: ICD-10-CM

## 2017-04-15 DIAGNOSIS — J189 Pneumonia, unspecified organism: ICD-10-CM

## 2017-04-15 DIAGNOSIS — E785 Hyperlipidemia, unspecified: ICD-10-CM

## 2017-04-15 DIAGNOSIS — I10 Essential (primary) hypertension: Principal | ICD-10-CM

## 2017-04-15 DIAGNOSIS — E079 Disorder of thyroid, unspecified: ICD-10-CM

## 2017-05-27 ENCOUNTER — Encounter: Admit: 2017-05-27 | Discharge: 2017-05-27 | Payer: MEDICARE

## 2017-05-27 ENCOUNTER — Ambulatory Visit: Admit: 2017-05-27 | Discharge: 2017-05-28 | Payer: MEDICARE

## 2017-05-27 DIAGNOSIS — R600 Localized edema: ICD-10-CM

## 2017-05-27 DIAGNOSIS — E079 Disorder of thyroid, unspecified: ICD-10-CM

## 2017-05-27 DIAGNOSIS — I10 Essential (primary) hypertension: Principal | ICD-10-CM

## 2017-05-27 DIAGNOSIS — J189 Pneumonia, unspecified organism: ICD-10-CM

## 2017-05-27 DIAGNOSIS — D3A090 Benign carcinoid tumor of the bronchus and lung: ICD-10-CM

## 2017-05-27 DIAGNOSIS — E782 Mixed hyperlipidemia: ICD-10-CM

## 2017-05-27 DIAGNOSIS — I5032 Chronic diastolic (congestive) heart failure: Principal | ICD-10-CM

## 2017-05-27 DIAGNOSIS — F99 Mental disorder, not otherwise specified: ICD-10-CM

## 2017-05-27 DIAGNOSIS — R635 Abnormal weight gain: ICD-10-CM

## 2017-05-27 DIAGNOSIS — E785 Hyperlipidemia, unspecified: ICD-10-CM

## 2017-05-27 DIAGNOSIS — M1990 Unspecified osteoarthritis, unspecified site: ICD-10-CM

## 2017-05-27 DIAGNOSIS — G5603 Carpal tunnel syndrome, bilateral upper limbs: ICD-10-CM

## 2017-05-27 DIAGNOSIS — C801 Malignant (primary) neoplasm, unspecified: ICD-10-CM

## 2017-05-27 DIAGNOSIS — K929 Disease of digestive system, unspecified: ICD-10-CM

## 2017-06-05 ENCOUNTER — Encounter: Admit: 2017-06-05 | Discharge: 2017-06-05 | Payer: MEDICARE

## 2017-06-05 DIAGNOSIS — E785 Hyperlipidemia, unspecified: ICD-10-CM

## 2017-06-05 DIAGNOSIS — J189 Pneumonia, unspecified organism: ICD-10-CM

## 2017-06-05 DIAGNOSIS — E079 Disorder of thyroid, unspecified: ICD-10-CM

## 2017-06-05 DIAGNOSIS — M1990 Unspecified osteoarthritis, unspecified site: ICD-10-CM

## 2017-06-05 DIAGNOSIS — I10 Essential (primary) hypertension: Principal | ICD-10-CM

## 2017-06-05 DIAGNOSIS — G5603 Carpal tunnel syndrome, bilateral upper limbs: ICD-10-CM

## 2017-06-05 DIAGNOSIS — C801 Malignant (primary) neoplasm, unspecified: ICD-10-CM

## 2017-06-05 DIAGNOSIS — F99 Mental disorder, not otherwise specified: ICD-10-CM

## 2017-06-05 DIAGNOSIS — K929 Disease of digestive system, unspecified: ICD-10-CM

## 2017-06-18 ENCOUNTER — Encounter: Admit: 2017-06-18 | Discharge: 2017-06-18 | Payer: MEDICARE

## 2017-06-18 DIAGNOSIS — R05 Cough: Principal | ICD-10-CM

## 2017-06-18 DIAGNOSIS — D3A090 Benign carcinoid tumor of the bronchus and lung: ICD-10-CM

## 2017-06-24 ENCOUNTER — Encounter: Admit: 2017-06-24 | Discharge: 2017-06-24 | Payer: MEDICARE

## 2017-06-24 ENCOUNTER — Ambulatory Visit: Admit: 2017-06-24 | Discharge: 2017-06-24 | Payer: MEDICARE

## 2017-06-24 DIAGNOSIS — K929 Disease of digestive system, unspecified: ICD-10-CM

## 2017-06-24 DIAGNOSIS — R0602 Shortness of breath: ICD-10-CM

## 2017-06-24 DIAGNOSIS — D3A090 Benign carcinoid tumor of the bronchus and lung: Secondary | ICD-10-CM

## 2017-06-24 DIAGNOSIS — E6609 Other obesity due to excess calories: ICD-10-CM

## 2017-06-24 DIAGNOSIS — G5603 Carpal tunnel syndrome, bilateral upper limbs: ICD-10-CM

## 2017-06-24 DIAGNOSIS — E079 Disorder of thyroid, unspecified: ICD-10-CM

## 2017-06-24 DIAGNOSIS — R062 Wheezing: ICD-10-CM

## 2017-06-24 DIAGNOSIS — F99 Mental disorder, not otherwise specified: ICD-10-CM

## 2017-06-24 DIAGNOSIS — I10 Essential (primary) hypertension: Principal | ICD-10-CM

## 2017-06-24 DIAGNOSIS — E785 Hyperlipidemia, unspecified: ICD-10-CM

## 2017-06-24 DIAGNOSIS — R05 Cough: Principal | ICD-10-CM

## 2017-06-24 DIAGNOSIS — J189 Pneumonia, unspecified organism: ICD-10-CM

## 2017-06-24 DIAGNOSIS — M1990 Unspecified osteoarthritis, unspecified site: ICD-10-CM

## 2017-06-24 DIAGNOSIS — C801 Malignant (primary) neoplasm, unspecified: ICD-10-CM

## 2017-08-12 ENCOUNTER — Encounter: Admit: 2017-08-12 | Discharge: 2017-08-12 | Payer: MEDICARE

## 2017-08-12 MED ORDER — HYDROCHLOROTHIAZIDE 25 MG PO TAB
ORAL_TABLET | Freq: Every day | ORAL | 2 refills | 28.00000 days | Status: AC
Start: 2017-08-12 — End: 2017-12-22

## 2017-09-16 ENCOUNTER — Encounter: Admit: 2017-09-16 | Discharge: 2017-09-16 | Payer: MEDICARE

## 2017-09-16 ENCOUNTER — Ambulatory Visit: Admit: 2017-09-16 | Discharge: 2017-09-16 | Payer: MEDICARE

## 2017-09-16 DIAGNOSIS — I10 Essential (primary) hypertension: Principal | ICD-10-CM

## 2017-09-16 DIAGNOSIS — M1990 Unspecified osteoarthritis, unspecified site: ICD-10-CM

## 2017-09-16 DIAGNOSIS — C801 Malignant (primary) neoplasm, unspecified: ICD-10-CM

## 2017-09-16 DIAGNOSIS — E785 Hyperlipidemia, unspecified: ICD-10-CM

## 2017-09-16 DIAGNOSIS — J189 Pneumonia, unspecified organism: ICD-10-CM

## 2017-09-16 DIAGNOSIS — C7A090 Malignant carcinoid tumor of the bronchus and lung: ICD-10-CM

## 2017-09-16 DIAGNOSIS — K929 Disease of digestive system, unspecified: ICD-10-CM

## 2017-09-16 DIAGNOSIS — E079 Disorder of thyroid, unspecified: ICD-10-CM

## 2017-09-16 DIAGNOSIS — F99 Mental disorder, not otherwise specified: ICD-10-CM

## 2017-09-16 DIAGNOSIS — G5603 Carpal tunnel syndrome, bilateral upper limbs: ICD-10-CM

## 2017-09-16 DIAGNOSIS — D3A090 Benign carcinoid tumor of the bronchus and lung: Principal | ICD-10-CM

## 2017-09-16 LAB — POC CREATININE, RAD: Lab: 0.5 mg/dL (ref 0.4–1.00)

## 2017-09-16 MED ORDER — IOHEXOL 350 MG IODINE/ML IV SOLN
70 mL | Freq: Once | INTRAVENOUS | 0 refills | Status: CP
Start: 2017-09-16 — End: ?
  Administered 2017-09-16: 17:00:00 70 mL via INTRAVENOUS

## 2017-09-16 MED ORDER — SODIUM CHLORIDE 0.9 % IJ SOLN
50 mL | Freq: Once | INTRAVENOUS | 0 refills | Status: CP
Start: 2017-09-16 — End: ?
  Administered 2017-09-16: 17:00:00 50 mL via INTRAVENOUS

## 2017-09-19 ENCOUNTER — Encounter: Admit: 2017-09-19 | Discharge: 2017-09-19 | Payer: MEDICARE

## 2017-09-19 DIAGNOSIS — E785 Hyperlipidemia, unspecified: ICD-10-CM

## 2017-09-19 DIAGNOSIS — G5603 Carpal tunnel syndrome, bilateral upper limbs: ICD-10-CM

## 2017-09-19 DIAGNOSIS — C801 Malignant (primary) neoplasm, unspecified: ICD-10-CM

## 2017-09-19 DIAGNOSIS — E079 Disorder of thyroid, unspecified: ICD-10-CM

## 2017-09-19 DIAGNOSIS — M1990 Unspecified osteoarthritis, unspecified site: ICD-10-CM

## 2017-09-19 DIAGNOSIS — I10 Essential (primary) hypertension: Principal | ICD-10-CM

## 2017-09-19 DIAGNOSIS — J189 Pneumonia, unspecified organism: ICD-10-CM

## 2017-09-19 DIAGNOSIS — F99 Mental disorder, not otherwise specified: ICD-10-CM

## 2017-09-19 DIAGNOSIS — K929 Disease of digestive system, unspecified: ICD-10-CM

## 2017-09-30 ENCOUNTER — Ambulatory Visit: Admit: 2017-09-30 | Discharge: 2017-10-01 | Payer: MEDICARE

## 2017-09-30 ENCOUNTER — Encounter: Admit: 2017-09-30 | Discharge: 2017-09-30 | Payer: MEDICARE

## 2017-09-30 DIAGNOSIS — M1990 Unspecified osteoarthritis, unspecified site: ICD-10-CM

## 2017-09-30 DIAGNOSIS — C801 Malignant (primary) neoplasm, unspecified: ICD-10-CM

## 2017-09-30 DIAGNOSIS — K929 Disease of digestive system, unspecified: ICD-10-CM

## 2017-09-30 DIAGNOSIS — G5603 Carpal tunnel syndrome, bilateral upper limbs: ICD-10-CM

## 2017-09-30 DIAGNOSIS — F99 Mental disorder, not otherwise specified: ICD-10-CM

## 2017-09-30 DIAGNOSIS — R5383 Other fatigue: Secondary | ICD-10-CM

## 2017-09-30 DIAGNOSIS — I10 Essential (primary) hypertension: Principal | ICD-10-CM

## 2017-09-30 DIAGNOSIS — E079 Disorder of thyroid, unspecified: ICD-10-CM

## 2017-09-30 DIAGNOSIS — J189 Pneumonia, unspecified organism: ICD-10-CM

## 2017-09-30 DIAGNOSIS — E785 Hyperlipidemia, unspecified: ICD-10-CM

## 2017-10-01 DIAGNOSIS — G4730 Sleep apnea, unspecified: ICD-10-CM

## 2017-10-01 DIAGNOSIS — D3A090 Benign carcinoid tumor of the bronchus and lung: ICD-10-CM

## 2017-10-01 DIAGNOSIS — R0683 Snoring: Principal | ICD-10-CM

## 2017-10-01 DIAGNOSIS — R0609 Other forms of dyspnea: ICD-10-CM

## 2017-10-03 ENCOUNTER — Encounter: Admit: 2017-10-03 | Discharge: 2017-10-03 | Payer: MEDICARE

## 2017-10-21 DIAGNOSIS — R0683 Snoring: ICD-10-CM

## 2017-10-21 DIAGNOSIS — R5383 Other fatigue: ICD-10-CM

## 2017-10-21 DIAGNOSIS — G4733 Obstructive sleep apnea (adult) (pediatric): Principal | ICD-10-CM

## 2017-10-22 ENCOUNTER — Ambulatory Visit: Admit: 2017-10-22 | Discharge: 2017-10-21 | Payer: MEDICARE

## 2017-11-10 ENCOUNTER — Encounter: Admit: 2017-11-10 | Discharge: 2017-11-10 | Payer: MEDICARE

## 2017-11-10 DIAGNOSIS — G4733 Obstructive sleep apnea (adult) (pediatric): Principal | ICD-10-CM

## 2017-11-24 ENCOUNTER — Encounter: Admit: 2017-11-24 | Discharge: 2017-11-24 | Payer: MEDICARE

## 2017-12-22 ENCOUNTER — Ambulatory Visit: Admit: 2017-12-22 | Discharge: 2017-12-23 | Payer: MEDICARE

## 2017-12-22 ENCOUNTER — Encounter: Admit: 2017-12-22 | Discharge: 2017-12-22 | Payer: MEDICARE

## 2017-12-22 DIAGNOSIS — R600 Localized edema: ICD-10-CM

## 2017-12-22 DIAGNOSIS — K929 Disease of digestive system, unspecified: ICD-10-CM

## 2017-12-22 DIAGNOSIS — E079 Disorder of thyroid, unspecified: ICD-10-CM

## 2017-12-22 DIAGNOSIS — J189 Pneumonia, unspecified organism: ICD-10-CM

## 2017-12-22 DIAGNOSIS — E785 Hyperlipidemia, unspecified: ICD-10-CM

## 2017-12-22 DIAGNOSIS — C801 Malignant (primary) neoplasm, unspecified: ICD-10-CM

## 2017-12-22 DIAGNOSIS — E782 Mixed hyperlipidemia: Principal | ICD-10-CM

## 2017-12-22 DIAGNOSIS — G5603 Carpal tunnel syndrome, bilateral upper limbs: ICD-10-CM

## 2017-12-22 DIAGNOSIS — C7A090 Malignant carcinoid tumor of the bronchus and lung: ICD-10-CM

## 2017-12-22 DIAGNOSIS — M1990 Unspecified osteoarthritis, unspecified site: ICD-10-CM

## 2017-12-22 DIAGNOSIS — F99 Mental disorder, not otherwise specified: ICD-10-CM

## 2017-12-22 DIAGNOSIS — I10 Essential (primary) hypertension: ICD-10-CM

## 2017-12-23 ENCOUNTER — Encounter: Admit: 2017-12-23 | Discharge: 2017-12-23 | Payer: MEDICARE

## 2018-01-05 ENCOUNTER — Encounter: Admit: 2018-01-05 | Discharge: 2018-01-05 | Payer: MEDICARE

## 2018-01-05 MED ORDER — AMLODIPINE 2.5 MG PO TAB
2.5 mg | ORAL_TABLET | Freq: Two times a day (BID) | ORAL | 3 refills | Status: AC
Start: 2018-01-05 — End: 2019-01-06

## 2018-02-17 ENCOUNTER — Encounter: Admit: 2018-02-17 | Discharge: 2018-02-17 | Payer: MEDICARE

## 2018-02-17 ENCOUNTER — Ambulatory Visit: Admit: 2018-02-17 | Discharge: 2018-02-18 | Payer: MEDICARE

## 2018-02-17 DIAGNOSIS — E785 Hyperlipidemia, unspecified: ICD-10-CM

## 2018-02-17 DIAGNOSIS — C801 Malignant (primary) neoplasm, unspecified: ICD-10-CM

## 2018-02-17 DIAGNOSIS — G5603 Carpal tunnel syndrome, bilateral upper limbs: ICD-10-CM

## 2018-02-17 DIAGNOSIS — E079 Disorder of thyroid, unspecified: ICD-10-CM

## 2018-02-17 DIAGNOSIS — K929 Disease of digestive system, unspecified: ICD-10-CM

## 2018-02-17 DIAGNOSIS — M1990 Unspecified osteoarthritis, unspecified site: ICD-10-CM

## 2018-02-17 DIAGNOSIS — J189 Pneumonia, unspecified organism: ICD-10-CM

## 2018-02-17 DIAGNOSIS — I10 Essential (primary) hypertension: Principal | ICD-10-CM

## 2018-02-17 DIAGNOSIS — F99 Mental disorder, not otherwise specified: ICD-10-CM

## 2018-02-18 DIAGNOSIS — G4733 Obstructive sleep apnea (adult) (pediatric): Principal | ICD-10-CM

## 2018-02-18 DIAGNOSIS — D3A090 Benign carcinoid tumor of the bronchus and lung: ICD-10-CM

## 2018-02-18 DIAGNOSIS — G4734 Idiopathic sleep related nonobstructive alveolar hypoventilation: ICD-10-CM

## 2018-02-19 ENCOUNTER — Encounter: Admit: 2018-02-19 | Discharge: 2018-02-19 | Payer: MEDICARE

## 2018-02-19 DIAGNOSIS — E079 Disorder of thyroid, unspecified: ICD-10-CM

## 2018-02-19 DIAGNOSIS — J189 Pneumonia, unspecified organism: ICD-10-CM

## 2018-02-19 DIAGNOSIS — M1990 Unspecified osteoarthritis, unspecified site: ICD-10-CM

## 2018-02-19 DIAGNOSIS — F99 Mental disorder, not otherwise specified: ICD-10-CM

## 2018-02-19 DIAGNOSIS — C801 Malignant (primary) neoplasm, unspecified: ICD-10-CM

## 2018-02-19 DIAGNOSIS — K929 Disease of digestive system, unspecified: ICD-10-CM

## 2018-02-19 DIAGNOSIS — E785 Hyperlipidemia, unspecified: ICD-10-CM

## 2018-02-19 DIAGNOSIS — G5603 Carpal tunnel syndrome, bilateral upper limbs: ICD-10-CM

## 2018-02-19 DIAGNOSIS — I10 Essential (primary) hypertension: Principal | ICD-10-CM

## 2018-02-19 MED ORDER — OXYBUTYNIN CHLORIDE 10 MG PO TR24
10 mg | ORAL_TABLET | Freq: Every day | ORAL | 1 refills | 12.00000 days | Status: AC
Start: 2018-02-19 — End: 2018-03-17

## 2018-02-19 MED ORDER — LOSARTAN 100 MG PO TAB
ORAL_TABLET | Freq: Two times a day (BID) | ORAL | 3 refills | 30.00000 days | Status: AC
Start: 2018-02-19 — End: 2019-02-23

## 2018-02-20 ENCOUNTER — Encounter: Admit: 2018-02-20 | Discharge: 2018-02-20 | Payer: MEDICARE

## 2018-02-20 ENCOUNTER — Ambulatory Visit: Admit: 2018-02-19 | Discharge: 2018-02-20 | Payer: MEDICARE

## 2018-02-20 DIAGNOSIS — Z7689 Persons encountering health services in other specified circumstances: ICD-10-CM

## 2018-02-20 DIAGNOSIS — G4733 Obstructive sleep apnea (adult) (pediatric): ICD-10-CM

## 2018-02-20 DIAGNOSIS — E039 Hypothyroidism, unspecified: ICD-10-CM

## 2018-02-20 DIAGNOSIS — Z23 Encounter for immunization: Principal | ICD-10-CM

## 2018-02-20 DIAGNOSIS — Z1231 Encounter for screening mammogram for malignant neoplasm of breast: Secondary | ICD-10-CM

## 2018-02-20 DIAGNOSIS — G5601 Carpal tunnel syndrome, right upper limb: Secondary | ICD-10-CM

## 2018-02-23 LAB — BASIC METABOLIC PANEL
Lab: 0.6
Lab: 10 — ABNORMAL HIGH (ref 8.4–10.2)
Lab: 107
Lab: 11
Lab: 121 — ABNORMAL HIGH (ref 70–105)
Lab: 14
Lab: 140 K/UL (ref 0–0.20)
Lab: 26
Lab: 4

## 2018-02-23 LAB — LIPID PROFILE: Lab: 158 K/UL (ref 0–0.45)

## 2018-02-23 LAB — LIVER FUNCTION PANEL: Lab: 0.7 mL/min (ref >60)

## 2018-02-26 ENCOUNTER — Encounter: Admit: 2018-02-26 | Discharge: 2018-02-26 | Payer: MEDICARE

## 2018-02-26 DIAGNOSIS — E782 Mixed hyperlipidemia: Principal | ICD-10-CM

## 2018-03-05 ENCOUNTER — Encounter: Admit: 2018-03-05 | Discharge: 2018-03-05 | Payer: MEDICARE

## 2018-03-06 MED ORDER — ATORVASTATIN 10 MG PO TAB
ORAL_TABLET | Freq: Every day | 3 refills
Start: 2018-03-06 — End: ?

## 2018-03-13 ENCOUNTER — Encounter: Admit: 2018-03-13 | Discharge: 2018-03-13 | Payer: MEDICARE

## 2018-03-13 MED ORDER — ATORVASTATIN 10 MG PO TAB
ORAL_TABLET | Freq: Every day | 3 refills | Status: AC
Start: 2018-03-13 — End: 2019-05-20

## 2018-03-17 ENCOUNTER — Encounter: Admit: 2018-03-17 | Discharge: 2018-03-17 | Payer: MEDICARE

## 2018-03-17 MED ORDER — OXYBUTYNIN CHLORIDE 10 MG PO TR24
ORAL_TABLET | Freq: Every day | 3 refills | 30.00000 days | Status: AC
Start: 2018-03-17 — End: 2019-03-12

## 2018-03-20 ENCOUNTER — Encounter: Admit: 2018-03-20 | Discharge: 2018-03-20 | Payer: MEDICARE

## 2018-05-09 IMAGING — CT Thorax^1_CHEST_WITH (Adult)
1 of 2 series · 15 of 30 positions shown, 19 images · IV contrast (APPLIED)
Comparison: none

[Series 3: chest with 1.5 soft tissue · axial · 0.79mm/px · z∈[-373,-46]mm · 15 of 240 slices shown, 19 images]
[im 12/240  mediastinal]
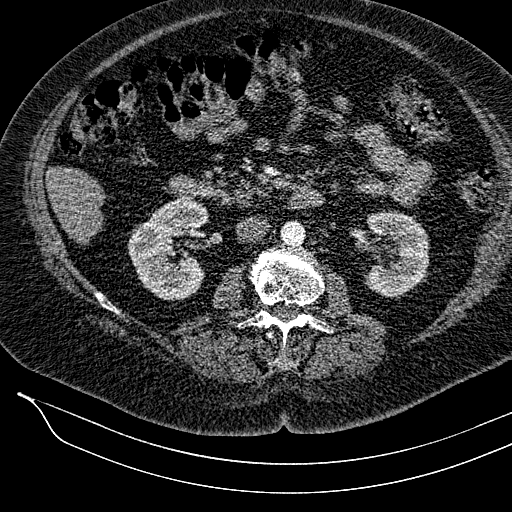
[im 12/240  lung]
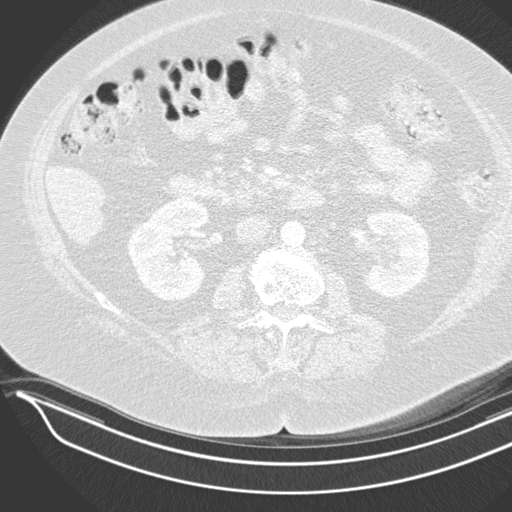
[im 36/240  lung]
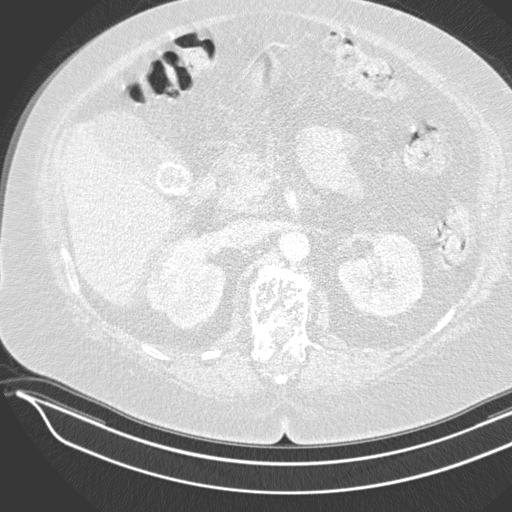
[im 48/240  lung]
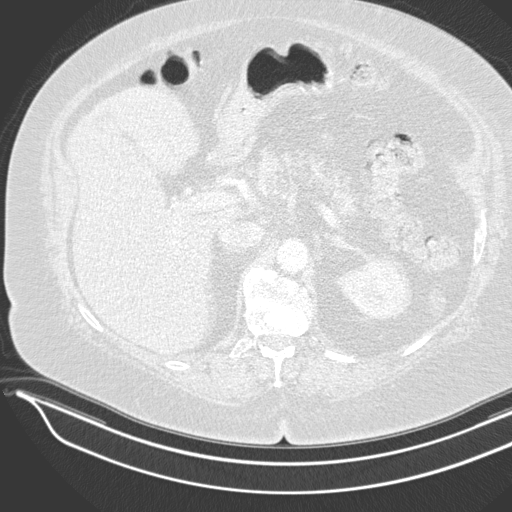
[im 60/240  lung]
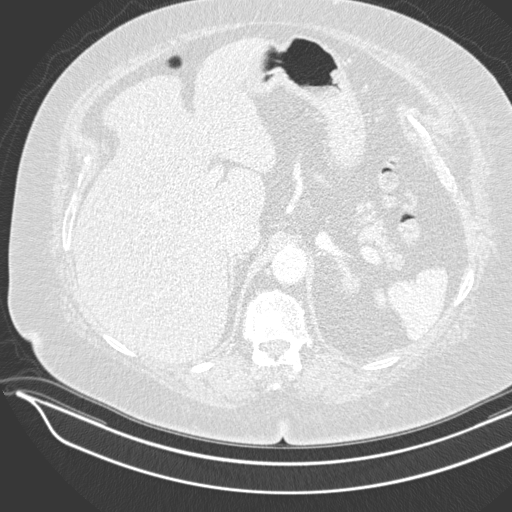
[im 84/240  mediastinal]
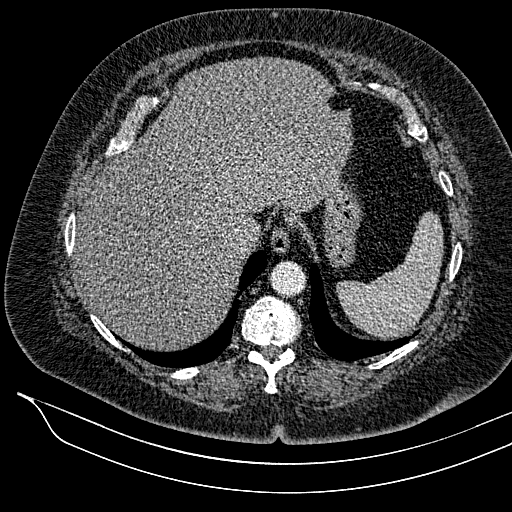
[im 84/240  lung]
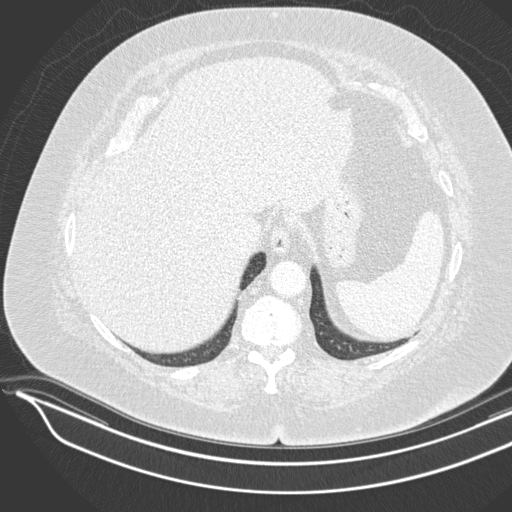
[im 96/240  lung]
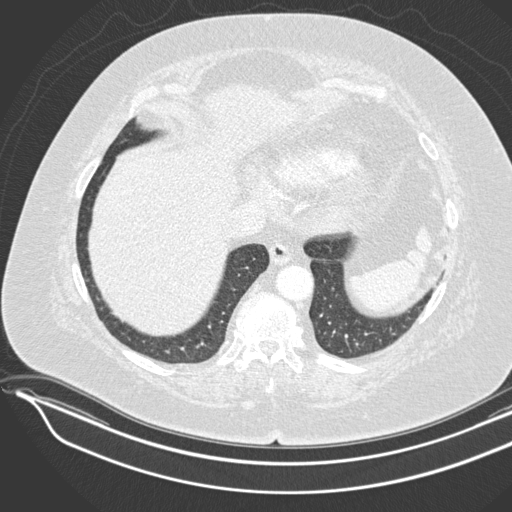
[im 108/240  lung]
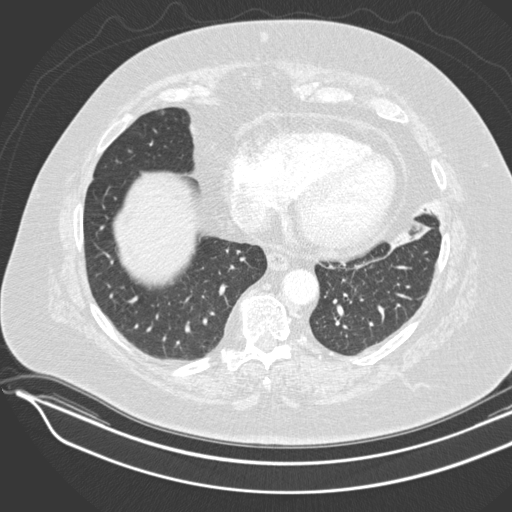
[im 120/240  lung]
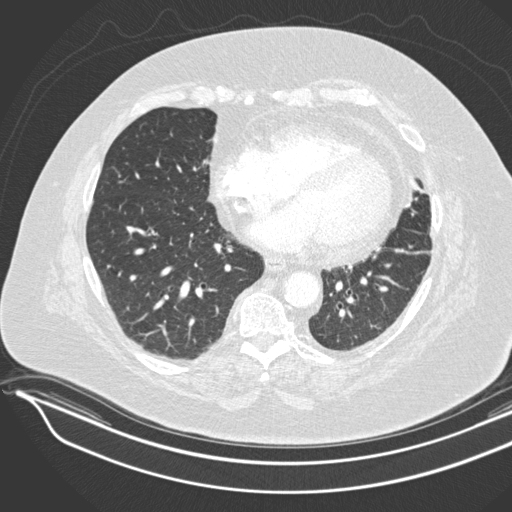
[im 132/240  mediastinal]
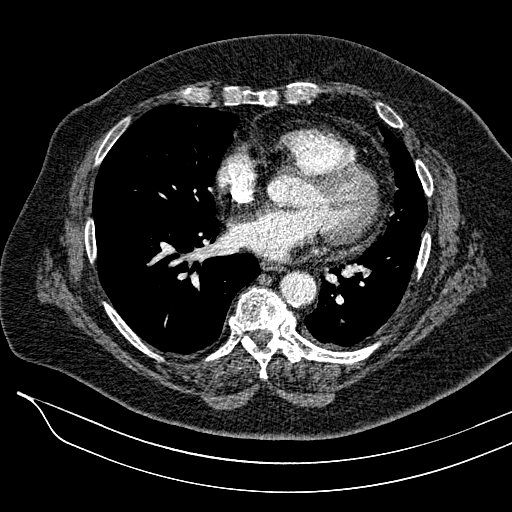
[im 132/240  lung]
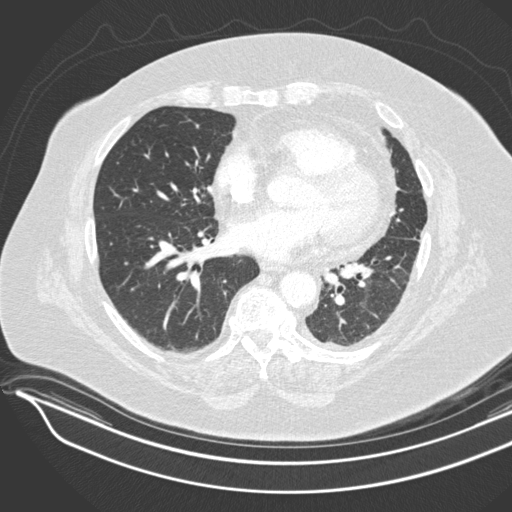
[im 144/240  lung]
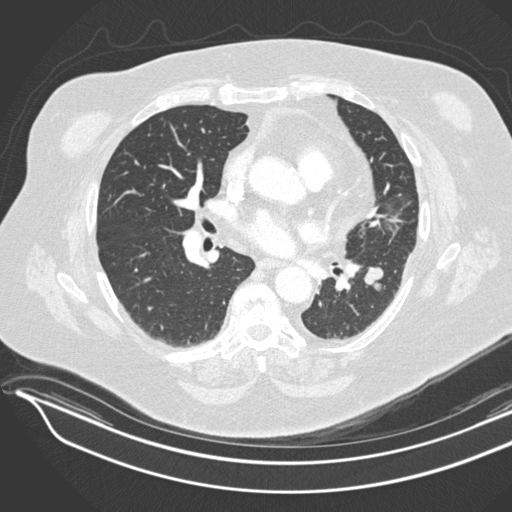
[im 168/240  lung]
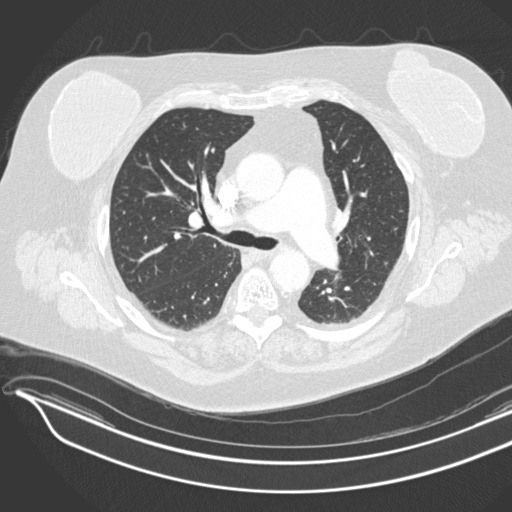
[im 180/240  lung]
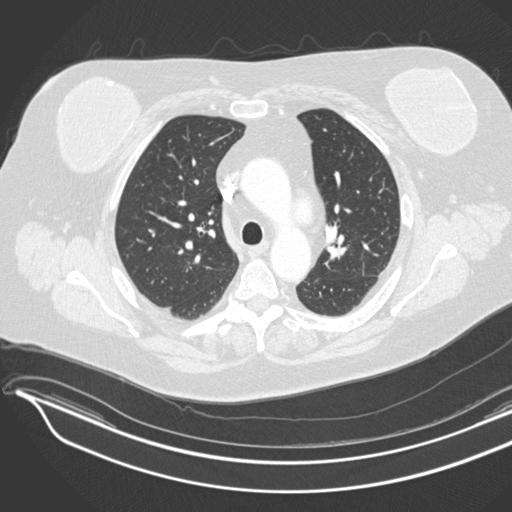
[im 192/240  mediastinal]
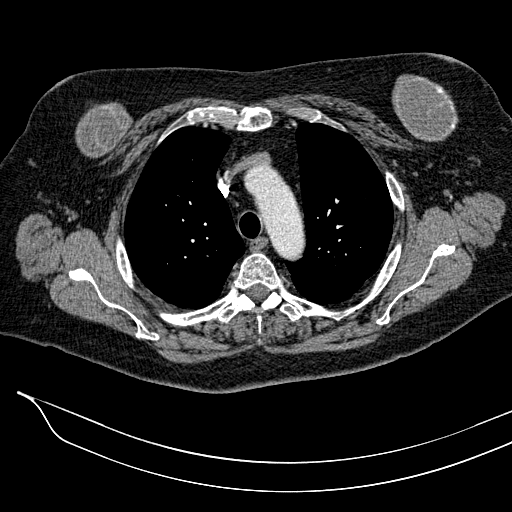
[im 192/240  lung]
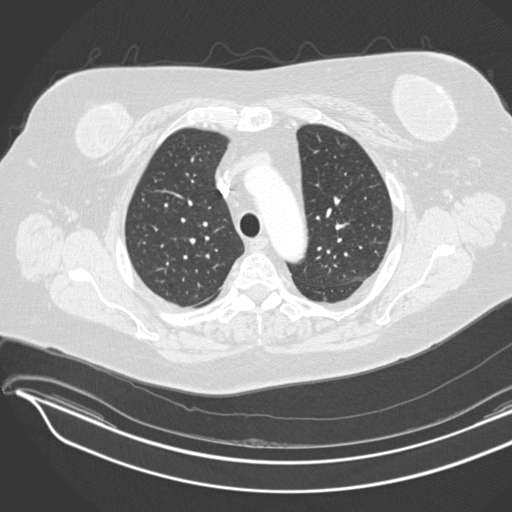
[im 216/240  lung]
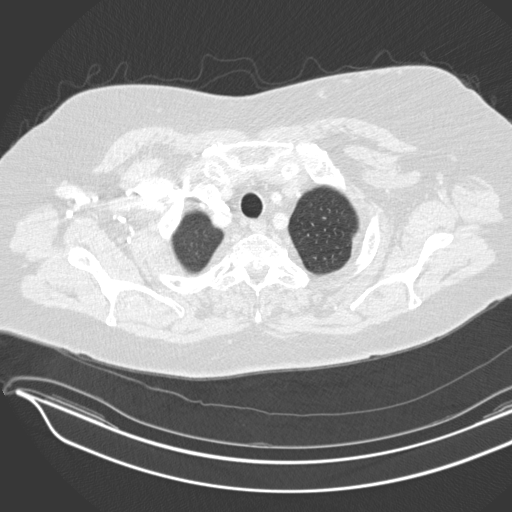
[im 228/240  lung]
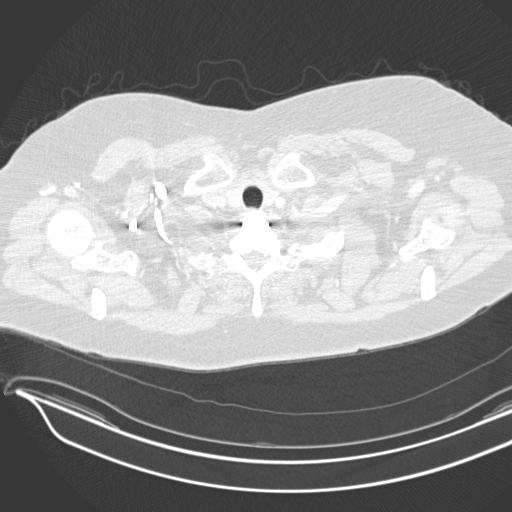

[15 of 30 positions shown; findings below may reference images not displayed]

DIAGNOSTIC STUDIES

EXAM

COMPUTED TOMOGRAPHY, THORAX; WITH CONTRAST MATERIAL CPT 17676

INDICATION

Pulmonary nodule
F/U ABNORMAL CXR. PT C/O DRY COUGH. HX OF BREAST CA X 32 YEARS AGO.
PNEUMONIA. GFR 107. TVVH4 [id number]. TJ

TECHNIQUE

Contrast-enhanced chest CT was performed with 5mm axial images from the thoracic inlet through the
lung bases after uncomplicated administration of [100 mL] of [Omnipaque 300] intravenous contrast.

All CT scans at this facility use dose modulation, iterative reconstruction, and/or weight based
dosing when appropriate to reduce radiation dose to as low as reasonably achievable.

COMPARISONS

Plain films of the chest dated 12/11/2016.

FINDINGS

The trachea lies in the midline. Postsurgical changes in the cervical spine are noted. There are
no focal thyroid masses. There is no evidence of supraclavicular or axillary adenopathy.

Bilateral breast augmentation is noted. The origin of the great vessels appears within normal
limits. There is calcified plaque in the aortic arch.

The main pulmonary artery and the right and left pulmonary arteries are normal in caliber and
contour. There is a small pericardial effusion. There are no pleural effusions. In the superior
segment of the left lower lobe there is a 2.3 x 1.7 x 1.9 cm solid mass. This is best seen on
series 3, image number 92 and corresponding coronal series 4, image number 45. Findings are
suspicious for malignancy. Percutaneous tissue sampling is advised.

Thoracic spondylosis is present. There are no blastic or lytic lesions. Axial images through the
upper abdomen demonstrate hepatic steatosis. Cholelithiasis is noted as well. There are no adrenal
masses.

IMPRESSION

2.3 x 1.7 x 1.9 cm solid mass in the superior segment of the left lower lobe as described above.
Further evaluation with percutaneous tissue sampling is advised in order to exclude malignancy.

## 2018-05-11 ENCOUNTER — Encounter: Admit: 2018-05-11 | Discharge: 2018-05-11 | Payer: MEDICARE

## 2018-05-18 ENCOUNTER — Encounter: Admit: 2018-05-18 | Discharge: 2018-05-18 | Payer: MEDICARE

## 2018-05-19 MED ORDER — LEVOTHYROXINE 88 MCG PO TAB
ORAL_TABLET | Freq: Every day | ORAL | 1 refills | 30.00000 days | Status: AC
Start: 2018-05-19 — End: 2018-11-06

## 2018-05-19 NOTE — Telephone Encounter
LOV 02/19/18 Est care  LMF historical

## 2018-06-17 ENCOUNTER — Encounter: Admit: 2018-06-17 | Discharge: 2018-06-17 | Payer: MEDICARE

## 2018-06-17 DIAGNOSIS — Z1212 Encounter for screening for malignant neoplasm of rectum: Principal | ICD-10-CM

## 2018-06-18 ENCOUNTER — Encounter: Admit: 2018-06-18 | Discharge: 2018-06-18 | Payer: MEDICARE

## 2018-06-18 DIAGNOSIS — Z0000 Encounter for general adult medical examination without abnormal findings: Principal | ICD-10-CM

## 2018-07-02 ENCOUNTER — Encounter: Admit: 2018-07-02 | Discharge: 2018-07-02 | Payer: MEDICARE

## 2018-07-02 DIAGNOSIS — Z0000 Encounter for general adult medical examination without abnormal findings: Principal | ICD-10-CM

## 2018-07-09 ENCOUNTER — Encounter: Admit: 2018-07-09 | Discharge: 2018-07-09 | Payer: MEDICARE

## 2018-07-09 DIAGNOSIS — Z0000 Encounter for general adult medical examination without abnormal findings: Principal | ICD-10-CM

## 2018-07-31 ENCOUNTER — Encounter: Admit: 2018-07-31 | Discharge: 2018-07-31 | Payer: MEDICARE

## 2018-08-19 ENCOUNTER — Encounter: Admit: 2018-08-19 | Discharge: 2018-08-19

## 2018-08-19 DIAGNOSIS — D369 Benign neoplasm, unspecified site: Secondary | ICD-10-CM

## 2018-08-19 DIAGNOSIS — Z0000 Encounter for general adult medical examination without abnormal findings: Secondary | ICD-10-CM

## 2018-09-07 NOTE — Progress Notes
Date of Service: 09/08/2018       Subjective:             Alicia Fox is a 68 y.o. female.      History of Present Illness  Alicia Fox returns to thoracic surgery clinic for follow up.  Alicia Fox presented to thoracic surgery for evaluation of biopsy-proven left lower lobe carcinoid.??????On 03/21/17 Alicia Fox underwent a???Robotic assisted left lower lobectomy under the direction of Dr. Particia Nearing.  Pathology impression:  Lung: Typical carcinoid tumor.   Greatest dimension (centimeters): 3.0 cm. Additional dimensions (centimeters): 2.5 x 2.5 cm.  Pathologic Stage Classification (pTNM, AJCC 8th Edition): pT1c N0     09/2017 CT-   1. ???Interval left lower lobectomy without evidence of recurrent or residual left lung nodule.    2. ???Several tiny right lung nodules remain stable which may be scars or granulomas. These are unchanged since the first available study of December 16, 2016. An additional follow-up chest CT in 12 months is suggested to assess for long-term stability.    3. ???No thoracic lymphadenopathy.    4. ???At least mild coronary artery calcification again noted.    Today Alicia Fox states symptoms of chronic dry cough that is intermittent. Alicia Fox notices in the evening, overnight, and in the morning. Alicia Fox's taking antacids more recently. Alicia Fox's had weight gain since COVID quarantine. Alicia Fox is home and not very active.  Denies fevers, shortness of breath, pleuritic pain, bone pain, headaches, weight loss, hemoptysis. There has been no hospitalizations or changes in medical history since last visit.    CT today   1. ???Stable left lower lobectomy changes without evidence of recurrent or   residual left lung mass.    2. ???Several tiny pulmonary nodules remain stable since at least December 16, 2016, likely representing scars or granulomas. No new or enlarging   pulmonary nodules are appreciated.    3. ???No thoracic lymphadenopathy.    4. ???At least mild coronary artery calcification.       ROS  Constitution: Negative. HENT: Negative.    Eyes: Negative.    Cardiovascular: Positive for dyspnea on exertion.   Respiratory: Positive for cough (for the past year every day ).    Endocrine: Negative.    Skin: Negative.    Musculoskeletal: Negative.    Gastrointestinal: Negative.    Genitourinary: Negative.    Neurological: Negative.    Psychiatric/Behavioral: Positive for depression.   Allergic/Immunologic: Negative.      Objective:         ??? acetaminophen (TYLENOL) 500 mg tablet Take two tablets by mouth every 6 hours as needed for Pain. Max of 4,000 mg of acetaminophen in 24 hours.   ??? amLODIPine (NORVASC) 2.5 mg tablet TAKE 1 TABLET BY MOUTH TWICE DAILY.   ??? aspirin EC 81 mg tablet Take 1 Tab by mouth daily.   ??? atorvastatin (LIPITOR) 10 mg tablet TAKE 1 TABLET BY MOUTH DAILY   ??? CANNABIDIOL (CBD) EXTRACT PO Take 1,000 mg by mouth daily.   ??? cetirizine (ZYRTEC) 10 mg tablet Take 10 mg by mouth daily.   ??? Cranberry Extract 500 mg cap Take 1 capsule by mouth daily.   ??? Folic Acid 800 mcg tab Take 1 tablet by mouth daily.   ??? furosemide (LASIX) 20 mg tablet Take 20-40 mg by mouth. PRN   ??? glucosamine HCl/chondroitin su (GLUCOSAMINE-CHONDROITIN PO) Take 1 tablet by mouth daily.   ??? levothyroxine (SYNTHROID) 88 mcg tablet TAKE 1  TABLET BY MOUTH EVERY DAY IN THE MORNING   ??? losartan (COZAAR) 100 mg tablet TAKE 1 TABLET BY MOUTH TWICE DAILY.   ??? melatonin 10 mg cap Take  by mouth daily.   ??? oxybutynin XL (DITROPAN XL) 10 mg tablet TAKE 1 TABLET BY MOUTH EVERY DAY   ??? vit C,E-Zn-coppr-lutein-zeaxan (OCUVITE LUTEIN AND ZEAXANTHIN) 60 mg-30 unit- 15 mg-2 mg-6 mg cap Take 1 tablet by mouth daily.     Vitals:    09/08/18 1534   BP: (!) 152/78   BP Source: Arm, Left Upper   Pulse: 108   Temp: 36.8 ???C (98.3 ???F)   SpO2: 92%   Weight: 129.7 kg (286 lb)   Height: 1.6 m (5' 3)   PainSc: Zero     Body mass index is 50.66 kg/m???.     Physical Exam  Vitals signs reviewed.   Constitutional:       Appearance: Alicia Fox is well-developed. Alicia Fox is obese.   HENT: Head: Normocephalic.      Nose: Nose normal.   Eyes:      Conjunctiva/sclera: Conjunctivae normal.   Neck:      Musculoskeletal: Neck supple.   Cardiovascular:      Rate and Rhythm: Normal rate and regular rhythm.      Heart sounds: Normal heart sounds.   Pulmonary:      Effort: Pulmonary effort is normal.      Breath sounds: Normal breath sounds.   Musculoskeletal: Normal range of motion.   Skin:     General: Skin is warm and dry.   Neurological:      Mental Status: Alicia Fox is alert and oriented to person, place, and time.   Psychiatric:         Behavior: Behavior normal.         Thought Content: Thought content normal.         Judgment: Judgment normal.              Assessment and Plan:  1. Malignant carcinoid tumor of lung (HCC)        Overall, we are pleased with Alicia Fox progress. See plan below.    1. Follow up in 1 year for continued carcinoid surveillance with CT chest.   2. Chronic dry cough- trial OTC PPI. Avoid eating at least 2 hours before bed. Follow up with PCP for reflux management which may be related to cough.     Should any issues or concerns arise prior; we would be more than happy to see patient sooner.   Micheal Likens, APRN  Thoracic Surgery and Lung Cancer Screening            ATTESTATION  ???  I personally interviewed and examined the patient.  I have reviewed the history, physical, impression and plan outlined by the Nurse Practitioner.     I had the pleasure of seeing Alicia Fox today in thoracic surgery clinic.  As you are aware Alicia Fox is a 68 year old female who underwent a robotic assisted left lower lobectomy for a T1c N0 typical carcinoid on March 21, 2017.  Alicia Fox presents today for 1 year surveillance visit.  In the interval time Alicia Fox denies any acute hospitalizations.  Alicia Fox denies any hemoptysis, bone pain, headaches, other constitutional symptoms.  Alicia Fox is overall feeling quite well.  ???  On physical exam Alicia Fox is 5 feet 3 inches weighing 129.7 kg giving her BMI 50.7.  Chest clear to auscultation bilaterally.  Cardiovascular regular rate.  ???  CT scan today demonstrates no evidence of recurrence of disease on preliminary read.  We will follow-up the final read as indicated.  Otherwise we will see her back in 1 years time with a repeat CT scan of her chest.  I appreciate the opportunity to participate in her care.  Please feel free to contact us with any questions or concerns.    Staff name: Oleh Genin. Ignacia Palma, MD

## 2018-09-08 ENCOUNTER — Encounter: Admit: 2018-09-08 | Discharge: 2018-09-08

## 2018-09-08 ENCOUNTER — Ambulatory Visit: Admit: 2018-09-08 | Discharge: 2018-09-08

## 2018-09-08 DIAGNOSIS — C7A090 Malignant carcinoid tumor of the bronchus and lung: Principal | ICD-10-CM

## 2018-09-08 DIAGNOSIS — I10 Essential (primary) hypertension: Secondary | ICD-10-CM

## 2018-09-08 DIAGNOSIS — J189 Pneumonia, unspecified organism: Secondary | ICD-10-CM

## 2018-09-08 DIAGNOSIS — M1990 Unspecified osteoarthritis, unspecified site: Secondary | ICD-10-CM

## 2018-09-08 DIAGNOSIS — C801 Malignant (primary) neoplasm, unspecified: Secondary | ICD-10-CM

## 2018-09-08 DIAGNOSIS — E785 Hyperlipidemia, unspecified: Secondary | ICD-10-CM

## 2018-09-08 DIAGNOSIS — G5603 Carpal tunnel syndrome, bilateral upper limbs: Secondary | ICD-10-CM

## 2018-09-08 DIAGNOSIS — E079 Disorder of thyroid, unspecified: Secondary | ICD-10-CM

## 2018-09-08 DIAGNOSIS — F99 Mental disorder, not otherwise specified: Secondary | ICD-10-CM

## 2018-09-08 DIAGNOSIS — K929 Disease of digestive system, unspecified: Secondary | ICD-10-CM

## 2018-09-08 MED ORDER — SODIUM CHLORIDE 0.9 % IJ SOLN
50 mL | Freq: Once | INTRAVENOUS | 0 refills | Status: CP
Start: 2018-09-08 — End: ?
  Administered 2018-09-08: 20:00:00 50 mL via INTRAVENOUS

## 2018-09-08 MED ORDER — IOHEXOL 350 MG IODINE/ML IV SOLN
80 mL | Freq: Once | INTRAVENOUS | 0 refills | Status: CP
Start: 2018-09-08 — End: ?
  Administered 2018-09-08: 20:00:00 80 mL via INTRAVENOUS

## 2018-09-21 ENCOUNTER — Encounter: Admit: 2018-09-21 | Discharge: 2018-09-21

## 2018-09-23 ENCOUNTER — Encounter: Admit: 2018-09-23 | Discharge: 2018-09-23

## 2018-09-23 ENCOUNTER — Ambulatory Visit: Admit: 2018-09-23 | Discharge: 2018-09-24

## 2018-09-23 DIAGNOSIS — I10 Essential (primary) hypertension: Secondary | ICD-10-CM

## 2018-09-23 DIAGNOSIS — M1990 Unspecified osteoarthritis, unspecified site: Secondary | ICD-10-CM

## 2018-09-23 DIAGNOSIS — D649 Anemia, unspecified: Secondary | ICD-10-CM

## 2018-09-23 DIAGNOSIS — E079 Disorder of thyroid, unspecified: Secondary | ICD-10-CM

## 2018-09-23 DIAGNOSIS — G5603 Carpal tunnel syndrome, bilateral upper limbs: Secondary | ICD-10-CM

## 2018-09-23 DIAGNOSIS — J189 Pneumonia, unspecified organism: Secondary | ICD-10-CM

## 2018-09-23 DIAGNOSIS — C801 Malignant (primary) neoplasm, unspecified: Secondary | ICD-10-CM

## 2018-09-23 DIAGNOSIS — Z1211 Encounter for screening for malignant neoplasm of colon: Secondary | ICD-10-CM

## 2018-09-23 DIAGNOSIS — K929 Disease of digestive system, unspecified: Secondary | ICD-10-CM

## 2018-09-23 DIAGNOSIS — F99 Mental disorder, not otherwise specified: Secondary | ICD-10-CM

## 2018-09-23 DIAGNOSIS — E785 Hyperlipidemia, unspecified: Secondary | ICD-10-CM

## 2018-09-23 MED ORDER — PANTOPRAZOLE 40 MG PO TBEC
40 mg | ORAL_TABLET | Freq: Every day | ORAL | 0 refills | 90.00000 days | Status: DC
Start: 2018-09-23 — End: 2018-10-15

## 2018-09-23 NOTE — Patient Instructions
Please start pantoprazole for one month to see if this improves your cough.    Please have your labs draw.  An order will be faxed to [facility] today.    Someone will call you to schedule an EGD and colonoscopy.    Great to see you!

## 2018-09-23 NOTE — Progress Notes
Obtained patient's, or patient proxy's, verbal consent to treat them and their agreement to [facility] financial policy and NPP via this telehealth visit during the [facility] Emergency      Date of Service: 09/23/2018    Alicia Fox is a 68 y.o. female.  DOB: Jul 09, 1950  MRN: [id number]   she was last seen by me 02/19/2018      Subjective:            She presents today for an Annual Medicare Wellness visit. She was last seen by me 02/19/2018.  Patient states she is feeling well and is doing okay during COVID-19 with isolation.  She does complain of an ongoing cough.  She saw her CTS physician earlier this week who recommended a PPI.  Patient does state that she belches frequently and clears her throat.  Patient also is concerned about a colonoscopy.  She had a Cologuard this year that was negative.  Had a colonoscopy in 2014 with a tubular adenoma they recommended a repeat colonoscopy in 5 years.        Chief Complaint   Patient presents with   ??? Follow Up     Pt has questions regarding colonoscopy being scheduled after doing cologuard   ??? Cough     chronic cough with belching, pt taking omeprazole but not completely resollved       Medical History:   Diagnosis Date   ??? Arthritis     lumbar, knees   ??? Cancer (HCC)     breast   ??? Carpal tunnel syndrome on both sides 2018    L > R   ??? Disorder of thyroid gland     hypothyroid   ??? Gastrointestinal disorder     gerd at times-under control with meds   ??? HTN (hypertension)    ??? Hyperlipidemia    ??? Pneumonia     bilateral bacterial   ??? Psychiatric illness     depression     Surgical History:   Procedure Laterality Date   ??? HX MASTECTOMY Bilateral 1986    1986-stomberg mastectomy   ??? BREAST BIOPSY  07/1984   ??? MASTECTOMY, PARTIAL Bilateral 12/1984   ??? MASTECTOMY, MODIFIED RADICAL Bilateral 1987    1987-full modified   ??? BREAST SURGERY Bilateral 07/1985    implants   ??? HX DILATION AND CURETTAGE  07/1994   ??? HX HYSTERECTOMY  10/1994    complete ??? HX ABDOMINOPLASTY  10/1994   ??? HIP SURGERY  02/1995   ??? HX LIPOMA RESECTION  11/1999    outer upper left arm   ??? ANUS SURGERY  01/2010    anal fissure   ??? HX LIPOMA RESECTION  02/2010    inside upper right arm   ??? LEFT DECOMPRESSION NERVE MEDIAN WITH CARPAL TUNNEL RELEASE Left 10/17/2016    Performed by Delice Lesch, MD at [facility] OR   ??? BRONCHOSCOPY N/A 01/13/2017    Performed by Audie Box, MD at [facility] OR   ??? BRONCHOSCOPY WITH TRANSBRONCHIAL BIOPSY N/A 01/13/2017    Performed by Audie Box, MD at [facility] OR   ??? BRONCHOSCOPY WITH ENDOBRONCHIAL ULTRASOUND GUIDED TRANSTRACHEAL/ TRANSBRONCHIAL SAMPLING - 3 OR MORE MEDIASTINAL/ HILAR LYMPH NODE STATIONS/ STRUCTURE - RIGID N/A 01/13/2017    Performed by Audie Box, MD at [facility] OR   ??? BRONCHOSCOPY WITH IMAGE - GUIDED NAVIGATION - FLEXIBLE N/A 01/13/2017    Performed by Audie Box, MD at [facility] OR   ??? ROBOTIC  LEFT LOWER LOBECTOMY Left 03/21/2017    Performed by Particia Nearing, MD at [facility] CVOR   ??? SPINE SURGERY  1993 and 1996    cervical fusion     Family History   Problem Relation Age of Onset   ??? Hypertension Mother    ??? Atrial Fibrillation Mother    ??? Hypertension Father    ??? COPD Father    ??? Heart Failure Father    ??? Kidney Cancer Brother    ??? Cancer-Prostate Brother      Social History     Socioeconomic History   ??? Marital status: Widowed     Spouse name: Not on file   ??? Number of children: Not on file   ??? Years of education: Not on file   ??? Highest education level: Not on file   Occupational History   ??? Occupation: retired   Tobacco Use   ??? Smoking status: Former Smoker     Last attempt to quit: 03/04/1989     Years since quitting: 29.5   ??? Smokeless tobacco: Never Used   ??? Tobacco comment: quit in 1991   Substance and Sexual Activity   ??? Alcohol use: Not Currently     Comment: very very rare- 2 years since last drink   ??? Drug use: No   ??? Sexual activity: Not on file   Other Topics Concern   ??? Not on file   Social History Narrative   ??? Not on file I reviewed medications, allergies, problem list and tobacco history at this visit.    A Health Risk assessment was performed by the patient today & reviewed with the patient.  Health Risk Assessment Questionnaire  Current Care  List of Providers you have seen in the last two years: (not recorded)  Are you receiving home health?: No  During the past 4 weeks, how would you rate your health in general?: Good    Outside Care  Since your last PCP visit, have you received care outside of [facility]?: No        Physical Activity  Do you exercise or are you physically active?: (!) No      During the past four weeks, what was the hardest physical activity you could do for at least two minutes?: Light    Diet  In the past month, were you worried whether your food would run out before you or your family had money to buy more?: No  In the past 7 days, how many times did you eat fast food or junk food or pizza?: 0  In the past 7 days, how many servings of fruits or vegetables did you eat each day?: 4-5  In the past 7 days, how many sodas and sugar sweetened drinks (regular, not diet) did you drink each day?: 0    Smoke/Tobacco Use  Are you currently a smoker?: No      Alcohol Use  Do you drink alcohol?: No          Depression Screen  Little interest or pleasure in doing things: Not at All  Feeling down, depressed or hopeless: Not at All  Patient Scores:  PHQ-2: PHQ-2 Score: 0 (09/23/2018  8:00 AM)    PHQ-9: No data recorded  Interventions:  PHQ-2: No data recorded  Depression Interventions PHQ-2/9: No data recorded      Pain  How would you rate your pain today?: (!) Mild pain    Ambulation  Do you use any assistive devices for ambulation?: No      Fall Risk  Does it take you longer than 30 seconds to get up and out of a chair?: No  Have you fallen in the past year?: No      Motor Vehicle Safety  Do you fasten your seat belt when you are in the car?: Yes    Sun Exposure Do you protect yourself from the sun? For example, wear sunscreen when outside.: Yes    Hearing Loss  Do you have trouble hearing the television or radio when others do not?: No  Do you have to strain or struggle to hear/understand conversation?: No  Do you use hearing aids?: No    Cognitive Impairment  During the past 12 months, have you experienced confusion or memory loss that is happening more often or is getting worse?: No    Functional Screen  Do you live alone?: No  Do you live at: Home  Can you drive your own car or travel alone by bus or taxi?: Yes  Can you shop for groceries or clothes without help?: Yes  Can you prepare your own meals?: Yes  Can you do your own housework without help?: Yes  Can you handle your own money without help?: Yes  Do you need help eating, bathing, dressing, or getting around your home?: No  Do you feel safe?: Yes  Does anyone at home hurt you, hit you, or threaten you?: No  Have you ever been the victim of abuse?: No    Home Safety  Does your home have grab bars in the bathroom?: (!) No  Does your home have hand rails on stairs and steps?: Yes  Does your home have functioning smoke alarms?: Yes    Advance Directive  Do you have a living will or Advance Directive?: Yes      Dental Screen  Have you had an exam by your dentist in the last year?: (!) No    Vision Screen  Do you have diabetes?: No          In the last 12 months, did you ever eat less than you should because there wasn't enough money for food?: No (02/18/2018  8:40 AM)  In the last 12 months, has your utility company shut off your service for not paying your bills?: No (02/18/2018  8:40 AM)  Are you worried that in the next 2 months, you may not have stable housing?: No (02/18/2018  8:40 AM)  Are you afraid you might be hurt in your home by someone you know?: No (02/18/2018  8:40 AM)  Are you afraid you might be hurt in your apartment building or neighborhood?: No (02/18/2018  8:40 AM) Do problems getting child care make it difficult for you to work or study?: No (02/18/2018  8:40 AM)  In the last 12 months, have you needed to see a doctor, but could not because of cost?: No (02/18/2018  8:40 AM)  In the last 12 months, did you skip medications to save money?: No (02/18/2018  8:40 AM)  In the last 12 months, have you ever had to go without health care because you didn't have a way to get there?: No (02/18/2018  8:40 AM)  Do you have problems understanding what is told to you about your medical conditions?: No (02/18/2018  8:40 AM)  Do you often feel that you lack companionship?: No (02/18/2018  8:40 AM)  If you answered YES to  any questions above, would you like to discuss help with your social work team?: No (02/18/2018  8:40 AM)        Personal prevention Plan reviewed with patient.    While the patient was here today, due to his/her multiple chronic conditions it would be in the best interest of the patient for me to monitor, assess and evaluate those as well. They are as follows.  Patient Active Problem List    Diagnosis Date Noted   ??? OSA (obstructive sleep apnea) 02/17/2018     Overview Note:     Split night Pap titration dated 10/21/2017 at the [place] sleep lab showed severe obstructive sleep apnea with AHI of 105.6 events per hour successful CPAP titration 12 cmH2O using full facemask elevated frequency of.  Limb movement with index of 30.8 events per hour and nocturnal hypoxemia with oxygen desaturation nadir of 77% oxygen saturation below 88% for about 99.1 minutes      CPAP compliance for the past 1 month showed 100% usage for more than 4 hours average daily use of 4 hours and 24 minutes CPAP of 12 residual AHI 0.5 events per hour     ??? 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 Carpal tunnel syndrome of right wrist 10/22/2016   ??? Carpal tunnel syndrome of left wrist 10/09/2016     Overview Note:     Added automatically from request for surgery [id number] ??? 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 Hypercalcemia 08/09/2016   ??? Carpal tunnel syndrome, bilateral 08/06/2016   ??? Abnormal CT of the head 08/06/2016   ??? Weight loss 10/31/2015   ??? Cough 08/09/2014   ??? Framingham cardiac risk <10% in next 10 years 08/09/2014   ??? Medication side effects 05/18/2014   ??? Weight gain 04/28/2014   ??? Bilateral edema of lower extremity 04/28/2014   ??? Dyspnea on exertion 04/28/2014   ??? Diastolic heart failure (HCC) 04/28/2014   ??? Hyperlipemia 01/18/2010   ??? Hypertension 07/21/2008   ??? 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 Obesity 07/21/2008     Other concerns addressed at this visit -  Cough and colonoscopy    Records requested at the time of this visit:No    Prior consultations, labs, radiology reports reviewed at the time of this visit.Yes: Colonoscopy results from 2014      BMI:  Body mass index is 50.66 kg/m???.  No data recorded  Wt Readings from Last 10 Encounters:   09/23/18 129.7 kg (286 lb)   09/08/18 129.7 kg (286 lb)   02/19/18 122.2 kg (269 lb 6.4 oz)   02/17/18 122 kg (269 lb)   12/22/17 120.2 kg (265 lb)   09/30/17 126.1 kg (278 lb)   09/16/17 130.2 kg (287 lb)   06/24/17 124.2 kg (273 lb 12.8 oz)   05/27/17 125.6 kg (277 lb)   04/15/17 117 kg (258 lb)            Review of Systems   Constitution: Negative for fever and malaise/fatigue.   HENT: Negative for congestion.    Eyes: Negative for blurred vision.   Cardiovascular: Negative for chest pain and dyspnea on exertion.   Respiratory: Positive for cough. Negative for shortness of breath.    Skin: Negative for rash.   Musculoskeletal: Positive for back pain.   Gastrointestinal: Negative for abdominal pain, constipation, diarrhea, heartburn and nausea.   Neurological: Negative for headaches.   Psychiatric/Behavioral: Negative for depression. The patient  is not nervous/anxious.            Objective:         ??? amLODIPine (NORVASC) 2.5 mg tablet TAKE 1 TABLET BY MOUTH TWICE DAILY. ??? aspirin EC 81 mg tablet Take 1 Tab by mouth daily.   ??? atorvastatin (LIPITOR) 10 mg tablet TAKE 1 TABLET BY MOUTH DAILY   ??? CANNABIDIOL (CBD) EXTRACT PO Take 1,000 mg by mouth daily.   ??? cetirizine (ZYRTEC) 10 mg tablet Take 10 mg by mouth daily.   ??? Folic Acid 800 mcg tab Take 1 tablet by mouth daily.   ??? furosemide (LASIX) 20 mg tablet Take 20-40 mg by mouth. PRN   ??? glucosamine HCl/chondroitin su (GLUCOSAMINE-CHONDROITIN PO) Take 1 tablet by mouth daily.   ??? levothyroxine (SYNTHROID) 88 mcg tablet TAKE 1 TABLET BY MOUTH EVERY DAY IN THE MORNING   ??? losartan (COZAAR) 100 mg tablet TAKE 1 TABLET BY MOUTH TWICE DAILY.   ??? melatonin 10 mg cap Take  by mouth daily.   ??? oxybutynin XL (DITROPAN XL) 10 mg tablet TAKE 1 TABLET BY MOUTH EVERY DAY   ??? pantoprazole DR (PROTONIX) 40 mg tablet Take one tablet by mouth daily.     Vitals:    09/23/18 0735   BP: (!) 149/78   BP Source: Arm, Left Upper   Patient Position: Sitting   Pulse: 77   Temp: 36.4 ???C (97.5 ???F)   TempSrc: Oral   SpO2: 95%   Weight: 129.7 kg (286 lb)   Height: 160 cm (63)   PainSc: Zero     Vitals:    09/23/18 0735   BP: (!) 149/78   BP Source: Arm, Left Upper   Patient Position: Sitting   Pulse: 77         Body mass index is 50.66 kg/m???.     Physical Exam  General: No acute distress, alert and oriented  Eyes: EOMI  Pulmonary: Nonlabored breathing, able to speak in complete sentences without difficulty  Psych: Normal affect      Health Maintenance   Topic Date Due   ??? DTAP/TDAP VACCINES (1 - Tdap) 05/03/1967   ??? PHYSICAL (COMPREHENSIVE) EXAM  05/03/1967   ??? HEPATITIS C SCREENING  05/03/1967   ??? OSTEOPOROSIS SCREENING/MONITORING  05/03/2014   ??? SHINGLES RECOMBINANT VACCINE (2 of 2) 03/19/2018   ??? INFLUENZA VACCINE  12/03/2018   ??? PNEUMONIA (PPSV23) VACCINE (2 of 2 - PPSV23) 02/20/2019   ??? BREAST CANCER SCREENING  03/03/2019   ??? MEDICARE ANNUAL WELLNESS VISIT  09/23/2019   ??? COLORECTAL CANCER SCREENING  02/16/2023 The 10-year ASCVD risk score Denman George DC Jr., et al., 2013) is: 14.1%*    Values used to calculate the score:      Age: 88 years      Sex: Female      Is Non-Hispanic African American: No      Diabetic: No      Tobacco smoker: No      Systolic Blood Pressure: 149 mmHg      Is BP treated: Yes      HDL Cholesterol: 53 mg/dL*      Total Cholesterol: 158 mg/dL*      * - Cholesterol units were assumed for this score calculation        Assessment and Plan:    Lewis A. Kubick was seen today for follow up and cough.    Diagnoses and all orders for this visit:    Encounter for  Medicare annual wellness exam    Anemia, unspecified type  -     CBC; Future; Expected date: 09/23/2018    Hypercalcemia  -     BASIC METABOLIC PANEL; Future; Expected date: 09/23/2018    Encounter for screening for malignant neoplasm of colon  -     AMB REFERRAL TO GI LAB FOR PROCEDURE    Cough    Other orders  -     pantoprazole DR (PROTONIX) 40 mg tablet; Take one tablet by mouth daily.      Encounter Medications   Medications   ??? pantoprazole DR (PROTONIX) 40 mg tablet     Sig: Take one tablet by mouth daily.     Dispense:  30 tablet     Refill:  0     Patient Instructions   Please start pantoprazole for one month to see if this improves your cough.    Please have your labs draw.  An order will be faxed to [facility] today.    Someone will call you to schedule an EGD and colonoscopy.    Great to see you!    Visit Disposition     Dispositions    ??? Return in about 6 months (around 03/26/2019).          Future Appointments   Date Time Provider Department Center   09/24/2018 10:30 AM Dorris Fetch, MD [facility] CVM Exam   10/02/2018  9:30 AM Velta Addison, MD [facility] IM   09/07/2019 11:45 AM [facility] Radiolog   09/07/2019  1:45 PM Particia Nearing, MD [facility] CTS     Return in about 6 months (around 03/26/2019).    I reviewed with the patient their current medications and specifically any new medications prescribed at the time of this visit and we reviewed the expected benefits and potential side effects. All questions are answered to the patient's satisfaction.    ??? Health maintenance gaps were reviewed with the patient at the time of this visit.    ??? I emphasized the importance of medication adherence.   ??? The medical problems/diagnoses listed under assessment and plan were addressed at this visit and unless otherwise stated are adequately controlled.    I spent 30 minutes on this encounter

## 2018-09-24 ENCOUNTER — Encounter: Admit: 2018-09-24 | Discharge: 2018-09-24

## 2018-09-24 ENCOUNTER — Ambulatory Visit: Admit: 2018-09-24 | Discharge: 2018-09-25

## 2018-09-24 DIAGNOSIS — G5603 Carpal tunnel syndrome, bilateral upper limbs: Secondary | ICD-10-CM

## 2018-09-24 DIAGNOSIS — J189 Pneumonia, unspecified organism: Secondary | ICD-10-CM

## 2018-09-24 DIAGNOSIS — F99 Mental disorder, not otherwise specified: Secondary | ICD-10-CM

## 2018-09-24 DIAGNOSIS — Z0000 Encounter for general adult medical examination without abnormal findings: Secondary | ICD-10-CM

## 2018-09-24 DIAGNOSIS — G4733 Obstructive sleep apnea (adult) (pediatric): Secondary | ICD-10-CM

## 2018-09-24 DIAGNOSIS — R05 Cough: Secondary | ICD-10-CM

## 2018-09-24 DIAGNOSIS — K210 Gastro-esophageal reflux disease with esophagitis: Secondary | ICD-10-CM

## 2018-09-24 DIAGNOSIS — K929 Disease of digestive system, unspecified: Secondary | ICD-10-CM

## 2018-09-24 DIAGNOSIS — E782 Mixed hyperlipidemia: Secondary | ICD-10-CM

## 2018-09-24 DIAGNOSIS — I10 Essential (primary) hypertension: Secondary | ICD-10-CM

## 2018-09-24 DIAGNOSIS — E079 Disorder of thyroid, unspecified: Secondary | ICD-10-CM

## 2018-09-24 DIAGNOSIS — K317 Polyp of stomach and duodenum: Secondary | ICD-10-CM

## 2018-09-24 DIAGNOSIS — C801 Malignant (primary) neoplasm, unspecified: Secondary | ICD-10-CM

## 2018-09-24 DIAGNOSIS — T887XXA Unspecified adverse effect of drug or medicament, initial encounter: Secondary | ICD-10-CM

## 2018-09-24 DIAGNOSIS — I5032 Chronic diastolic (congestive) heart failure: Secondary | ICD-10-CM

## 2018-09-24 DIAGNOSIS — E785 Hyperlipidemia, unspecified: Secondary | ICD-10-CM

## 2018-09-24 DIAGNOSIS — R635 Abnormal weight gain: Secondary | ICD-10-CM

## 2018-09-24 DIAGNOSIS — M1990 Unspecified osteoarthritis, unspecified site: Secondary | ICD-10-CM

## 2018-09-24 NOTE — Progress Notes
Date of Service: 09/24/2018    Alicia Fox is a 68 y.o. female.       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  She underwent robotic surgical intervention on 03/21/2017.    From a cardiac standpoint this patient remained stable.  She does not report symptoms of chest pain or heart palpitations.    She continues to report having a chronic cough, she is not on any cardiac medications that could induce cough.  It is possible that she does have GERD, she has not undergone any recent GI work-up.  This patient also has a history of colon polyps.    The last cardiac work-up was performed in June 2018, that consisted of a regadenoson MPI, the perfusion pattern was negative for ischemia and ejection fraction was normal, she was also evaluated with a 2D echo Doppler study which did not reveal any structural heart disease.    Unfortunately due to dietary indiscretion patient gained approximately 15 pounds since December 2019.         Vitals:    09/24/18 1018   BP: 118/72   BP Source: Arm, Left Upper   Pulse: 112   Temp: 36.9 ???C (98.4 ???F)   SpO2: 98%   Weight: 128.8 kg (284 lb)   Height: 1.6 m (5' 3)   PainSc: Zero     Body mass index is 50.31 kg/m???.     Past Medical History  Patient Active Problem List    Diagnosis Date Noted   ??? OSA (obstructive sleep apnea) 02/17/2018     Split night Pap titration dated 10/21/2017 at the [place] sleep lab showed severe obstructive sleep apnea with AHI of 105.6 events per hour successful CPAP titration 12 cmH2O using full facemask elevated frequency of.  Limb movement with index of 30.8 events per hour and nocturnal hypoxemia with oxygen desaturation nadir of 77% oxygen saturation below 88% for about 99.1 minutes      CPAP compliance for the past 1 month showed 100% usage for more than 4 hours average daily use of 4 hours and 24 minutes CPAP of 12 residual AHI 0.5 events per hour     ??? 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 Carpal tunnel syndrome of right wrist 10/22/2016   ??? Carpal tunnel syndrome of left wrist 10/09/2016     Added automatically from request for surgery [id number]     ??? 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 Hypercalcemia 08/09/2016   ??? Carpal tunnel syndrome, bilateral 08/06/2016   ??? Abnormal CT of the head 08/06/2016   ??? Weight loss 10/31/2015   ??? Cough 08/09/2014   ??? Framingham cardiac risk <10% in next 10 years 08/09/2014   ??? Medication side effects 05/18/2014   ??? Weight gain 04/28/2014   ??? Bilateral edema of lower extremity 04/28/2014   ??? Dyspnea on exertion 04/28/2014   ??? Diastolic heart failure (HCC) 04/28/2014   ??? Hyperlipemia 01/18/2010   ??? Hypertension 07/21/2008   ??? 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 Obesity 07/21/2008         Review of Systems   Constitution: Negative.   HENT: Negative.    Eyes: Negative.    Cardiovascular: Negative.    Respiratory: Positive for cough.    Endocrine: Negative.    Hematologic/Lymphatic: Negative.  Skin: Negative.    Musculoskeletal: Positive for back pain.   Gastrointestinal: Negative.    Genitourinary: Negative.    Neurological: Negative.    Psychiatric/Behavioral: Negative.    Allergic/Immunologic: Negative.        Physical Exam  General Appearance: obese  Skin: warm, moist, no ulcers or xanthomas  Eyes: conjunctivae and lids normal, pupils are equal and round  Lips & Oral Mucosa: no pallor or cyanosis  Neck Veins: neck veins are flat, neck veins are not distended  Chest Inspection: chest is normal in appearance  Respiratory Effort: breathing comfortably, no respiratory distress  Auscultation/Percussion: lungs clear to auscultation, no rales or rhonchi, no wheezing  Cardiac Rhythm: regular rhythm and normal rate  Cardiac Auscultation: S1, S2 normal, no rub, no gallop  Murmurs: no murmur Carotid Arteries: normal carotid upstroke bilaterally, no bruit  Abdominal aorta: could not be examined due to obese adomen  Lower Extremity Edema: no lower extremity edema  Abdominal Exam: soft, non-tender, no masses, bowel sounds normal  Liver & Spleen: no organomegaly  Language and Memory: patient responsive and seems to comprehend information  Neurologic Exam: neurological assessment grossly intact        Cardiovascular Studies  Twelve-lead EKG demonstrated normal sinus rhythm, no ST segment T wave changes    Problems Addressed Today  Encounter Diagnoses   Name Primary?   ??? Gastroesophageal reflux disease with esophagitis Yes   ??? Gastric polyps    ??? Essential hypertension    ??? Mixed hyperlipidemia    ??? Chronic diastolic heart failure (HCC)    ??? Weight gain    ??? OSA (obstructive sleep apnea)    ??? Medication side effects        Assessment and Plan     In summary: Alicia Fox is a 68 year old white female with stable cardiovascular issues, this consist of hypertension, hyperlipidemia, she is morbidly obese with a BMI of 50.31 that is most likely due to dietary indiscretion, patient gained approximately 15 pounds since December 2019.    At present time she does not have any symptoms of chest pain, heart palpitations, there is no reported presyncope or syncope.    The most recent cardiac work-up was performed in June 2018, it did not reveal any abnormalities.    Patient also reports symptoms of chronic cough (could be due to underlying GERD), and she also has a history of colon polyps.    Plan:    1.  I did recommend weight reduction, and I asked the patient to follow the weight watchers program which is essentially a calorie count and reduce caloric intake.  Also recommended regular physical activity.  I recommended a low fat, low-cholesterol, low sugar, low carbohydrate and low sodium diet.  2.  Continue all current medications  3.  I recommended further GI evaluation at [facility], we did submit a request, she would be probably called by the GI department and an appointment will be scheduled.  4.  Follow-up office visit in approximately 4 months.         Current Medications (including today's revisions)  ??? amLODIPine (NORVASC) 2.5 mg tablet TAKE 1 TABLET BY MOUTH TWICE DAILY.   ??? aspirin EC 81 mg tablet Take 1 Tab by mouth daily.   ??? atorvastatin (LIPITOR) 10 mg tablet TAKE 1 TABLET BY MOUTH DAILY   ??? CANNABIDIOL (CBD) EXTRACT PO Take 1,000 mg by mouth daily.   ??? cetirizine (ZYRTEC) 10 mg tablet Take 10 mg by mouth daily.   ???  Folic Acid 800 mcg tab Take 1 tablet by mouth daily.   ??? furosemide (LASIX) 20 mg tablet Take 20-40 mg by mouth. PRN   ??? glucosamine HCl/chondroitin su (GLUCOSAMINE-CHONDROITIN PO) Take 1 tablet by mouth daily.   ??? levothyroxine (SYNTHROID) 88 mcg tablet TAKE 1 TABLET BY MOUTH EVERY DAY IN THE MORNING   ??? losartan (COZAAR) 100 mg tablet TAKE 1 TABLET BY MOUTH TWICE DAILY.   ??? melatonin 10 mg cap Take  by mouth daily.   ??? oxybutynin XL (DITROPAN XL) 10 mg tablet TAKE 1 TABLET BY MOUTH EVERY DAY   ??? pantoprazole DR (PROTONIX) 40 mg tablet Take one tablet by mouth daily.

## 2018-10-02 ENCOUNTER — Encounter: Admit: 2018-10-02 | Discharge: 2018-10-02

## 2018-10-02 ENCOUNTER — Ambulatory Visit: Admit: 2018-10-02 | Discharge: 2018-10-03

## 2018-10-02 DIAGNOSIS — G4733 Obstructive sleep apnea (adult) (pediatric): Principal | ICD-10-CM

## 2018-10-02 DIAGNOSIS — E785 Hyperlipidemia, unspecified: Secondary | ICD-10-CM

## 2018-10-02 DIAGNOSIS — M1990 Unspecified osteoarthritis, unspecified site: Secondary | ICD-10-CM

## 2018-10-02 DIAGNOSIS — G5603 Carpal tunnel syndrome, bilateral upper limbs: Secondary | ICD-10-CM

## 2018-10-02 DIAGNOSIS — K929 Disease of digestive system, unspecified: Secondary | ICD-10-CM

## 2018-10-02 DIAGNOSIS — J189 Pneumonia, unspecified organism: Secondary | ICD-10-CM

## 2018-10-02 DIAGNOSIS — F99 Mental disorder, not otherwise specified: Secondary | ICD-10-CM

## 2018-10-02 DIAGNOSIS — I10 Essential (primary) hypertension: Secondary | ICD-10-CM

## 2018-10-02 DIAGNOSIS — C801 Malignant (primary) neoplasm, unspecified: Secondary | ICD-10-CM

## 2018-10-02 DIAGNOSIS — E079 Disorder of thyroid, unspecified: Secondary | ICD-10-CM

## 2018-10-02 NOTE — Progress Notes
Subjective:    History of Present Illness  Alicia Fox is a 68 y.o. female.  ???      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  In the past prior to using CPAP has been on oxygen 1.5 liter /night  She has been very compliant with her CPAP and using it every day, using full facemask  Occasionally BT around 9-11 PM   melatonin 10 mg Q HS for 8 months  No snoring on machine  Wakes few time for restroom at night  No coughing   dreamimg , complains of a mild dry cough occasionally but denies any wheezing or chest pain no kickening  WUT around  8 AM , not tired, feels much more rested in the morning since starting CPAP  No naps in the day  feels tired during the afternoon  caffeine 2 cups of coffee /AM  ESS 4/24  Complains of a dry cough occasionally which was going on for months postoperatively denies any mucus production denies any hemoptysis denies any chest pain  Lost a magnet piece of CPAP machine, waiting for new port to be delivered.  ???  Review of Systems   Constitutional: Positive for, weight gain  HENT: Negative.    Eyes: Negative.    Respiratory: Positive for chronic cough.   Cardiovascular: Negative.    Gastrointestinal:   Positive for belching, new GERD diagnosis  Endocrine: Negative.    Genitourinary: Negative.    Musculoskeletal: Negative.  Positive for Leg swelling  Skin: Negative.    Allergic/Immunologic: Negative.    Neurological: Negative.    Hematological: Negative.    Psychiatric/Behavioral: Negative.    ???  ???  ???  Objective:        Current Outpatient Medications:   ???  amLODIPine (NORVASC) 2.5 mg tablet, TAKE 1 TABLET BY MOUTH TWICE DAILY., Disp: 180 tablet, Rfl: 3  ???  aspirin EC 81 mg tablet, Take 1 Tab by mouth daily., Disp: 90 Tab, Rfl: 3 ???  atorvastatin (LIPITOR) 10 mg tablet, TAKE 1 TABLET BY MOUTH DAILY, Disp: 90 tablet, Rfl: 3  ???  CANNABIDIOL (CBD) EXTRACT PO, Take 1,000 mg by mouth daily., Disp: , Rfl:   ???  cetirizine (ZYRTEC) 10 mg tablet, Take 10 mg by mouth daily., Disp: , Rfl:   ???  Folic Acid 800 mcg tab, Take 1 tablet by mouth daily., Disp: , Rfl:   ???  furosemide (LASIX) 20 mg tablet, Take 20 mg by mouth daily. PRN, Disp: , Rfl:   ???  glucosamine HCl/chondroitin su (GLUCOSAMINE-CHONDROITIN PO), Take 1 tablet by mouth daily., Disp: , Rfl:   ???  levothyroxine (SYNTHROID) 88 mcg tablet, TAKE 1 TABLET BY MOUTH EVERY DAY IN THE MORNING, Disp: 90 tablet, Rfl: 1  ???  losartan (COZAAR) 100 mg tablet, TAKE 1 TABLET BY MOUTH TWICE DAILY., Disp: 180 tablet, Rfl: 3  ???  melatonin 10 mg cap, Take  by mouth daily., Disp: , Rfl:   ???  oxybutynin XL (DITROPAN XL) 10 mg tablet, TAKE 1 TABLET BY MOUTH EVERY DAY, Disp: 90 tablet, Rfl: 3  ???  pantoprazole DR (PROTONIX) 40 mg tablet, Take one tablet by mouth daily., Disp: 30 tablet, Rfl: 0           Vitals:     Vitals:  10/02/18 0921   BP: (!) 147/77   Pulse: 85   Temp: 36.7 ???C (98 ???F)   SpO2: 97%     Body mass index is 49.71 kg/m???.   ???  Physical Exam  Constitutional:       Appearance: Normal appearance. Obese  HENT:      Head: Normocephalic and atraumatic.      Nose: Nose normal.   Cardiovascular:      Rate and Rhythm: Normal rate and regular rhythm.   Pulmonary:      Effort: Pulmonary effort is normal.      Breath sounds: Normal breath sounds.   Abdominal:      General: Abdomen is flat.      Palpations: Abdomen is soft.   Musculoskeletal: Normal range of motion. 2+ edema to knees  Skin:     General: Skin is warm and dry.   Neurological:      General: No focal deficit present.      Mental Status: She is alert and oriented to person, place, and time.   Psychiatric:         Mood and Affect: Mood normal.   ???  ???  ???  ???  Assessment and Plan:  ???      Problem   Osa (Obstructive Sleep Apnea) ??? Split night Pap titration dated 10/21/2017 at the [place] sleep lab showed severe obstructive sleep apnea with AHI of 105.6 events per hour successful CPAP titration 12 cmH2O using full facemask elevated frequency of.  Limb movement with index of 30.8 events per hour and nocturnal hypoxemia with oxygen desaturation nadir of 77% oxygen saturation below 88% for about 99.1 minutes  ???  ???  CPAP compliance for the past 1 month showed 100% usage for more than 8 hours average daily use of 8 hours and 55 minutes CPAP of 12 set pressure, full-time EPR, level 3 AHI 0.4 events per hour.    CPAP set pressure 12 cm H2O, EPR full-time, EPR level 3.    Median leaks 0.2 L/min.    Epworth scale total score 3 today.  ???   Carcinoid Tumor of Lung   ???  ???  OSA (obstructive sleep apnea)  ???  Congratulated patient on her CPAP compliance which is 100% with average daily use of 8 hours and 55 minutes    Patient reports no issues with CPAP at this time aside from missing a magnet piece, which she has reordered and is currently being shipped to her house.  Alicia Fox feels much more rested in the mornings since starting her CPAP.  Unknown if snoring, patient is widowed.    Patient also has a history of rhinitis and she can use as needed over-the-counter saline nasal spray or Flonase nasal spray.  ???  Patient had moderate to severe nocturnal hypoxemia on her CPAP titration as the patient was not fully on her back during REM sleep which seemingly .  Was corrected using the CPAP titration.  We will check a nocturnal pulse ox on CPAP to confirm this.  ???  Carcinoid tumor of lung  Status post bronchoscopy last year status post robotic left lower lobe resection by cardiothoracic surgery  ???  Patient is following up with cardiothoracic surgery for follow-up monitoring including imaging.  Recent CT scan appeared unchanged.  Patient reporting recent weight gain and slight increase in shortness of breath, still able to walk around house without dyspnea but reports low activity level.  Patient reporting increased cough for  which she was recently prescribed pantoprazole for presumed cough secondary to acid reflux.  She reports improvement with her cough, patient was instructed to let us know if her cough/shortness of breath gets any worse and we will plan on reassessing PFTs.  ???  Return to clinic 1 year.  ???  Patient was seen and examined with Dr. Brett Canales, MD  ???  Alain Marion, DO   Internal Medicine, PGY-1  Available on [facility]  [phone]    ATTESTATION    I personally performed the key portions of the E/M visit, discussed case with resident and concur with resident documentation of history, physical exam, assessment, and treatment plan unless otherwise noted.      Staff name:  Velta Addison, MD Date:  10/03/2018

## 2018-10-03 NOTE — Patient Instructions
My nurse is Sarah Orwa, RN. She can be reached at [phone].    Please contact my nurse with any questions regarding your appointment.    If you need to schedule or reschedule an appointment, please contact [phone].    For refills on medications, please have your pharmacy fax a refill authorization request form to our office at [phone]. Please allow at least 3 business days for refill requests.    For urgent issues after business hours/weekends/holidays call [phone] and request for the pulmonary fellow to be paged.

## 2018-10-15 ENCOUNTER — Encounter: Admit: 2018-10-15 | Discharge: 2018-10-15

## 2018-10-15 MED ORDER — PANTOPRAZOLE 40 MG PO TBEC
40 mg | ORAL_TABLET | Freq: Every day | ORAL | 0 refills | 90.00000 days | Status: DC
Start: 2018-10-15 — End: 2019-01-07

## 2018-10-16 ENCOUNTER — Encounter: Admit: 2018-10-16 | Discharge: 2018-10-16

## 2018-10-16 MED ORDER — PANTOPRAZOLE 40 MG PO TBEC
ORAL_TABLET | Freq: Every day | 0 refills
Start: 2018-10-16 — End: ?

## 2018-10-28 ENCOUNTER — Encounter: Admit: 2018-10-28 | Discharge: 2018-10-28

## 2018-11-04 ENCOUNTER — Encounter: Admit: 2018-11-04 | Discharge: 2018-11-04

## 2018-11-06 ENCOUNTER — Encounter: Admit: 2018-11-06 | Discharge: 2018-11-06

## 2018-11-06 MED ORDER — LEVOTHYROXINE 88 MCG PO TAB
ORAL_TABLET | Freq: Every day | ORAL | 0 refills | 30.00000 days | Status: DC
Start: 2018-11-06 — End: 2018-11-30

## 2018-11-06 NOTE — Telephone Encounter
Pt needs to do labs

## 2018-11-28 ENCOUNTER — Encounter: Admit: 2018-11-28 | Discharge: 2018-11-28 | Payer: MEDICARE

## 2018-11-30 MED ORDER — LEVOTHYROXINE 88 MCG PO TAB
ORAL_TABLET | Freq: Every day | ORAL | 0 refills | 30.00000 days | Status: DC
Start: 2018-11-30 — End: 2018-12-23

## 2018-12-03 ENCOUNTER — Encounter: Admit: 2018-12-03 | Discharge: 2018-12-03 | Payer: MEDICARE

## 2018-12-23 ENCOUNTER — Encounter: Admit: 2018-12-23 | Discharge: 2018-12-23 | Payer: MEDICARE

## 2018-12-23 MED ORDER — LEVOTHYROXINE 88 MCG PO TAB
ORAL_TABLET | Freq: Every day | ORAL | 1 refills | 30.00000 days | Status: DC
Start: 2018-12-23 — End: 2019-06-09

## 2018-12-23 NOTE — Telephone Encounter
LOV 09/23/18 MAWV

## 2018-12-24 MED ORDER — FUROSEMIDE 20 MG PO TAB
20 mg | ORAL_TABLET | Freq: Every day | ORAL | 1 refills | 90.00000 days | Status: DC | PRN
Start: 2018-12-24 — End: 2019-02-18

## 2019-01-06 ENCOUNTER — Encounter: Admit: 2019-01-06 | Discharge: 2019-01-06 | Payer: MEDICARE

## 2019-01-06 ENCOUNTER — Ambulatory Visit: Admit: 2019-01-06 | Discharge: 2019-01-07 | Payer: MEDICARE

## 2019-01-06 DIAGNOSIS — Z86010 Personal history of colonic polyps: Secondary | ICD-10-CM

## 2019-01-06 DIAGNOSIS — G5603 Carpal tunnel syndrome, bilateral upper limbs: Secondary | ICD-10-CM

## 2019-01-06 DIAGNOSIS — I10 Essential (primary) hypertension: Secondary | ICD-10-CM

## 2019-01-06 DIAGNOSIS — K219 Gastro-esophageal reflux disease without esophagitis: Secondary | ICD-10-CM

## 2019-01-06 DIAGNOSIS — Z1159 Encounter for screening for other viral diseases: Secondary | ICD-10-CM

## 2019-01-06 DIAGNOSIS — R05 Cough: Secondary | ICD-10-CM

## 2019-01-06 DIAGNOSIS — K929 Disease of digestive system, unspecified: Secondary | ICD-10-CM

## 2019-01-06 DIAGNOSIS — K760 Fatty (change of) liver, not elsewhere classified: Secondary | ICD-10-CM

## 2019-01-06 DIAGNOSIS — F99 Mental disorder, not otherwise specified: Secondary | ICD-10-CM

## 2019-01-06 DIAGNOSIS — E785 Hyperlipidemia, unspecified: Secondary | ICD-10-CM

## 2019-01-06 DIAGNOSIS — D123 Benign neoplasm of transverse colon: Secondary | ICD-10-CM

## 2019-01-06 DIAGNOSIS — M1990 Unspecified osteoarthritis, unspecified site: Secondary | ICD-10-CM

## 2019-01-06 DIAGNOSIS — R142 Eructation: Secondary | ICD-10-CM

## 2019-01-06 DIAGNOSIS — J189 Pneumonia, unspecified organism: Secondary | ICD-10-CM

## 2019-01-06 DIAGNOSIS — C801 Malignant (primary) neoplasm, unspecified: Secondary | ICD-10-CM

## 2019-01-06 DIAGNOSIS — E079 Disorder of thyroid, unspecified: Secondary | ICD-10-CM

## 2019-01-06 MED ORDER — PEG-ELECTROLYTE SOLN 420 GRAM PO SOLR
4 L | ORAL | 0 refills | Status: DC
Start: 2019-01-06 — End: 2019-01-11

## 2019-01-06 MED ORDER — AMLODIPINE 2.5 MG PO TAB
2.5 mg | ORAL_TABLET | Freq: Two times a day (BID) | ORAL | 2 refills | Status: AC
Start: 2019-01-06 — End: ?

## 2019-01-06 NOTE — Progress Notes
GI Clinic Note:  Obtained patient's verbal consent to treat and agreement to [facility] financial policy and NPP via this telehealth visit during the [facility] Emergency. This was a video visit.    New Patient GI Office Visit    Referring Provider: Nickolas Madrid and Orson Gear      Reason for Visit/CC:    Chronic cough, concern for reflux disease    History of Present Illness     68 year old [place] female with left lower lung lobe carcinoid tumor status post left lobectomy in January 2019, breast cancer status post bilateral mastectomy, status post hysterectomy for concern of endometrial cancer, obesity, and other medical problems was referred to GI clinic for concern of reflux disease as a cause for her cough.    Index Visit Hx 11.4.20    Predominant symptom(s): Cough  chronic cough for years with no improvement after her left lobectomy for carcinoid tumor.  Per patient, pulmonary work-up was unrevealing.  Given concern for silent reflux she was placed on over-the-counter medication with significant improvement so she was put on pantoprazole 40 mg daily for now.  She denies any heartburn, regurgitation, dysphagia, or chest pain.  However, she reports belching, mostly after eating that lasts for about 10 minutes.  She does not think that pantoprazole helped the belching much.  Prior CT chest with no report of hiatal hernia, but noted hepatic steatosis.    PPI response, %: 80% improvement in her cough with pantoprazole 40 mg once daily.    She had a colonoscopy in 2009 with a small tubular adenoma removed.  Due to the ongoing pandemic she underwent Cologuard testing this year which was negative.  Her PCP still recommended that she undergo a colonoscopy given prior polyp.          Review of Systems   Constitutional: Negative.    Eyes: Negative.    Respiratory: Negative.    Cardiovascular: Negative.    Gastrointestinal: Negative.    Endocrine: Negative.    Genitourinary: Negative. Musculoskeletal: Negative.    Skin: Negative.    Allergic/Immunologic: Negative.    Neurological: Negative.    Hematological: Negative.    Psychiatric/Behavioral: Negative.    All other systems reviewed and are negative.           Medical History:   Diagnosis Date   ? Arthritis     lumbar, knees   ? Cancer [facility])     breast   ? Carpal tunnel syndrome on both sides 2018    L > R   ? Disorder of thyroid gland     hypothyroid   ? Gastrointestinal disorder     gerd at times-under control with meds   ? HTN (hypertension)    ? Hyperlipidemia    ? Pneumonia     bilateral bacterial   ? Psychiatric illness     depression     Surgical History:   Procedure Laterality Date   ? HX MASTECTOMY Bilateral 1986    1986-stomberg mastectomy   ? BREAST BIOPSY  07/1984   ? MASTECTOMY, PARTIAL Bilateral 12/1984   ? MASTECTOMY, MODIFIED RADICAL Bilateral 1987    1987-full modified   ? BREAST SURGERY Bilateral 07/1985    implants   ? HX DILATION AND CURETTAGE  07/1994   ? HX HYSTERECTOMY  10/1994    complete   ? HX ABDOMINOPLASTY  10/1994   ? HIP SURGERY  02/1995   ? HX LIPOMA RESECTION  11/1999  outer upper left arm   ? ANUS SURGERY  01/2010    anal fissure   ? HX LIPOMA RESECTION  02/2010    inside upper right arm   ? LEFT DECOMPRESSION NERVE MEDIAN WITH CARPAL TUNNEL RELEASE Left 10/17/2016    Performed by Delice Lesch, MD at [facility] OR   ? BRONCHOSCOPY N/A 01/13/2017    Performed by Audie Box, MD at [facility] OR   ? BRONCHOSCOPY WITH TRANSBRONCHIAL BIOPSY N/A 01/13/2017    Performed by Audie Box, MD at [facility] OR   ? BRONCHOSCOPY WITH ENDOBRONCHIAL ULTRASOUND GUIDED TRANSTRACHEAL/ TRANSBRONCHIAL SAMPLING - 3 OR MORE MEDIASTINAL/ HILAR LYMPH NODE STATIONS/ STRUCTURE - RIGID N/A 01/13/2017    Performed by Audie Box, MD at [facility] OR   ? BRONCHOSCOPY WITH IMAGE - GUIDED NAVIGATION - FLEXIBLE N/A 01/13/2017    Performed by Audie Box, MD at [facility] OR   ? ROBOTIC LEFT LOWER LOBECTOMY Left 03/21/2017    Performed by Particia Nearing, MD at [facility] ? SPINE SURGERY  1993 and 1996    cervical fusion     Family History   Problem Relation Age of Onset   ? Hypertension Mother    ? Atrial Fibrillation Mother    ? Hypertension Father    ? COPD Father    ? Heart Failure Father    ? Kidney Cancer Brother    ? Cancer-Prostate Brother      Social History     Socioeconomic History   ? Marital status: Widowed     Spouse name: Not on file   ? Number of children: Not on file   ? Years of education: Not on file   ? Highest education level: Not on file   Occupational History   ? Occupation: retired   Tobacco Use   ? Smoking status: Former Smoker     Last attempt to quit: 03/04/1989     Years since quitting: 29.8   ? Smokeless tobacco: Never Used   ? Tobacco comment: quit in 1991   Substance and Sexual Activity   ? Alcohol use: Not Currently     Comment: very very rare- 2 years since last drink   ? Drug use: No   ? Sexual activity: Not on file   Other Topics Concern   ? Not on file   Social History Narrative   ? Not on file          Objectives:  Vitals:    01/06/19 0849   BP: 139/81   Pulse: 89   Resp: 16   Temp: 36.7 ?C (98 ?F)   SpO2: 96%          Physical Exam  No hands-on physical exam was performed as this was a telehealth visit.  General: A&Ox3, no acute distress  HEENT: NC, AT  Skin: no visible abnormality      Labs, diagnostic testing, and available outside records personally reviewed   Mild anemia in January 2019 around the time of her last surgery.    Assessment / Plan:  -Chronic cough with 80% improvement with pantoprazole 40 mg daily.  Postprandial belching.  No heartburn, regurgitation, chest pain, or dysphagia.  No weight loss (gaining weight)    Given the significant improvement in her cough with pantoprazole 40 mg once daily her cough is likely related to reflux.  I discussed with the patient that although I prefer to objectively test patients with a wireless pH capsule off of PPI to confirm pathologic  reflux disease,  I do not necessarily feel strongly to do an objective testing for her reflux given the significant improvement in her cough.  However, given postprandial belching and the possibility of a hiatal hernia we will arrange for an EGD to assess for this and also to rule out Barrett's esophagus.  I also discussed with the patient to take her PPI twice daily before meals to see if that provides any additional improvement in her cough.  Anti-reflux measures:  ? Raise the head of the bed 4 to 6 inches  ? Sleep in a left lateral position  ? Avoid smoking, heavy alcohol drinking, excess coffee, tea or other caffeinated beverages  ? Avoid garments that fit tightly through the abdomen.  ? Avoid eating before bed and all food triggers for symptoms  ? Avoid all non-steroidal anti-inflammatory drugs (NSAID's) including but not limited to Ibuprofen, Advil, Motrin, and Nuprin.    We discussed the importance of exercise and weight loss and to swallow medications with at least 8 ounces of a clear liquid, remain upright for at least 30 minutes following ingestion of the medication      -Colon polyps: Small TA in 2019 and negative Cologuard this year.  Given Cologuard is approved for colon cancer screening and not for patients who has history of polyps, we will arrange for a colonoscopy.  Patient was aware and was expecting to have a colonoscopy per her prior conversation with her PCP.    -Hepatic steatosis: Seen on CT.  Patient does not drink alcohol.  Weight loss and exercise was discussed with patient.    -HCV screening: Patient was counseled to have that done through her PCP with her next lab checks.    -Anemia perioperatively in January 2019: Patient reports undergoing lab work few months ago and she will check with her PCP to assure that there was no residual anemia.      Goals of management discussed with patient and she understands and agrees with the plan of care.    RTC TBD after procedures are performed Total of 45 minutes spent on this patient's encounter with counseling and coordination of care taking >50% of the visit. Patient was alone during the encounter.    Meyer Cory MD, MS  GI attending

## 2019-01-07 ENCOUNTER — Encounter: Admit: 2019-01-07 | Discharge: 2019-01-07 | Payer: MEDICARE

## 2019-01-07 ENCOUNTER — Ambulatory Visit: Admit: 2019-01-07 | Discharge: 2019-01-07 | Payer: MEDICARE

## 2019-01-07 DIAGNOSIS — K219 Gastro-esophageal reflux disease without esophagitis: Secondary | ICD-10-CM

## 2019-01-07 DIAGNOSIS — Z86010 Personal history of colonic polyps: Secondary | ICD-10-CM

## 2019-01-07 DIAGNOSIS — K2100 Gastro-esophageal reflux disease with esophagitis, without bleeding: Secondary | ICD-10-CM

## 2019-01-07 DIAGNOSIS — I10 Essential (primary) hypertension: Secondary | ICD-10-CM

## 2019-01-07 DIAGNOSIS — K317 Polyp of stomach and duodenum: Secondary | ICD-10-CM

## 2019-01-07 MED ORDER — PANTOPRAZOLE 40 MG PO TBEC
ORAL_TABLET | Freq: Every day | ORAL | 0 refills | 90.00000 days | Status: DC
Start: 2019-01-07 — End: 2019-03-09

## 2019-01-11 MED ORDER — PEG-ELECTROLYTE SOLN 420 GRAM PO SOLR
4 L | ORAL | 0 refills | Status: DC
Start: 2019-01-11 — End: 2019-05-13

## 2019-01-13 ENCOUNTER — Encounter: Admit: 2019-01-13 | Discharge: 2019-01-13 | Payer: MEDICARE

## 2019-01-13 DIAGNOSIS — Z0000 Encounter for general adult medical examination without abnormal findings: Secondary | ICD-10-CM

## 2019-01-13 DIAGNOSIS — Z853 Personal history of malignant neoplasm of breast: Secondary | ICD-10-CM

## 2019-01-13 DIAGNOSIS — Z803 Family history of malignant neoplasm of breast: Secondary | ICD-10-CM

## 2019-01-13 DIAGNOSIS — E782 Mixed hyperlipidemia: Secondary | ICD-10-CM

## 2019-01-13 DIAGNOSIS — Z1231 Encounter for screening mammogram for malignant neoplasm of breast: Secondary | ICD-10-CM

## 2019-01-13 DIAGNOSIS — C50919 Malignant neoplasm of unspecified site of unspecified female breast: Secondary | ICD-10-CM

## 2019-01-19 ENCOUNTER — Encounter: Admit: 2019-01-19 | Discharge: 2019-01-19 | Payer: MEDICARE

## 2019-02-09 ENCOUNTER — Encounter: Admit: 2019-02-09 | Discharge: 2019-02-09 | Payer: MEDICARE

## 2019-02-15 ENCOUNTER — Encounter: Admit: 2019-02-15 | Discharge: 2019-02-15 | Payer: MEDICARE

## 2019-02-15 ENCOUNTER — Emergency Department: Admit: 2019-02-15 | Discharge: 2019-02-15 | Payer: MEDICARE

## 2019-02-15 ENCOUNTER — Inpatient Hospital Stay: Admit: 2019-02-15 | Payer: MEDICARE

## 2019-02-15 DIAGNOSIS — J189 Pneumonia, unspecified organism: Secondary | ICD-10-CM

## 2019-02-15 DIAGNOSIS — E079 Disorder of thyroid, unspecified: Secondary | ICD-10-CM

## 2019-02-15 DIAGNOSIS — G5603 Carpal tunnel syndrome, bilateral upper limbs: Secondary | ICD-10-CM

## 2019-02-15 DIAGNOSIS — M1990 Unspecified osteoarthritis, unspecified site: Secondary | ICD-10-CM

## 2019-02-15 DIAGNOSIS — K929 Disease of digestive system, unspecified: Secondary | ICD-10-CM

## 2019-02-15 DIAGNOSIS — I10 Essential (primary) hypertension: Secondary | ICD-10-CM

## 2019-02-15 DIAGNOSIS — U071 Pneumonia due to COVID-19 virus: Secondary | ICD-10-CM

## 2019-02-15 DIAGNOSIS — F99 Mental disorder, not otherwise specified: Secondary | ICD-10-CM

## 2019-02-15 DIAGNOSIS — C801 Malignant (primary) neoplasm, unspecified: Secondary | ICD-10-CM

## 2019-02-15 DIAGNOSIS — E785 Hyperlipidemia, unspecified: Secondary | ICD-10-CM

## 2019-02-15 LAB — POC LACTATE: Lab: 2 MMOL/L (ref 0.5–2.0)

## 2019-02-15 LAB — URINALYSIS MICROSCOPIC REFLEX TO CULTURE

## 2019-02-15 LAB — URINALYSIS DIPSTICK REFLEX TO CULTURE
Lab: NEGATIVE U/L (ref 7–56)
Lab: NEGATIVE U/L — ABNORMAL HIGH (ref 25–110)
Lab: NEGATIVE mL/min (ref 0–0.80)
Lab: POSITIVE K/UL — AB (ref 0–0.45)

## 2019-02-15 LAB — CBC AND DIFF
Lab: 0 % (ref 0–5)
Lab: 0 10*3/uL (ref 0–0.20)
Lab: 1 K/UL (ref 1.0–4.8)
Lab: 22 pg — ABNORMAL LOW (ref 26–34)
Lab: 33 g/dL (ref 32.0–36.0)
Lab: 5.6 M/UL — ABNORMAL HIGH (ref 4.0–5.0)
Lab: 6.3 10*3/uL (ref 4.5–11.0)
Lab: 23 — ABNORMAL HIGH (ref <20.7)

## 2019-02-15 LAB — COMPREHENSIVE METABOLIC PANEL
Lab: 169 mg/dL — ABNORMAL HIGH (ref 70–100)
Lab: 3.8 MMOL/L (ref 3.5–5.1)
Lab: 99 MMOL/L (ref 98–110)

## 2019-02-15 LAB — COVID-19 (SARS-COV-2) PCR

## 2019-02-15 LAB — POC TROPONIN: Lab: 0 ng/mL (ref 0.00–0.05)

## 2019-02-15 MED ORDER — ACETAMINOPHEN 325 MG PO TAB
650 mg | ORAL | 0 refills | Status: DC | PRN
Start: 2019-02-15 — End: 2019-02-18
  Administered 2019-02-16 – 2019-02-18 (×6): 650 mg via ORAL

## 2019-02-15 MED ORDER — ONDANSETRON HCL 4 MG PO TAB
4 mg | ORAL | 0 refills | Status: DC | PRN
Start: 2019-02-15 — End: 2019-02-18
  Administered 2019-02-15: 22:00:00 4 mg via ORAL

## 2019-02-15 MED ORDER — LOSARTAN 50 MG PO TAB
100 mg | Freq: Two times a day (BID) | ORAL | 0 refills | Status: DC
Start: 2019-02-15 — End: 2019-02-18
  Administered 2019-02-16 – 2019-02-18 (×6): 100 mg via ORAL

## 2019-02-15 MED ORDER — LEVOTHYROXINE 88 MCG PO TAB
88 ug | Freq: Every morning | ORAL | 0 refills | Status: DC
Start: 2019-02-15 — End: 2019-02-18
  Administered 2019-02-16 – 2019-02-18 (×3): 88 ug via ORAL

## 2019-02-15 MED ORDER — DEXAMETHASONE SODIUM PHOSPHATE 10 MG/ML IJ SOLN
6 mg | Freq: Every day | INTRAVENOUS | 0 refills | Status: DC
Start: 2019-02-15 — End: 2019-02-17
  Administered 2019-02-16 – 2019-02-17 (×2): 6 mg via INTRAVENOUS

## 2019-02-15 MED ORDER — CETIRIZINE 10 MG PO TAB
10 mg | Freq: Every day | ORAL | 0 refills | Status: DC
Start: 2019-02-15 — End: 2019-02-18
  Administered 2019-02-16 – 2019-02-18 (×3): 10 mg via ORAL

## 2019-02-15 MED ORDER — BENZONATATE 100 MG PO CAP
100 mg | Freq: Three times a day (TID) | ORAL | 0 refills | Status: DC | PRN
Start: 2019-02-15 — End: 2019-02-18
  Administered 2019-02-17 – 2019-02-18 (×2): 100 mg via ORAL

## 2019-02-15 MED ORDER — ASPIRIN 81 MG PO TBEC
81 mg | Freq: Every day | ORAL | 0 refills | Status: DC
Start: 2019-02-15 — End: 2019-02-18
  Administered 2019-02-16 – 2019-02-18 (×3): 81 mg via ORAL

## 2019-02-15 MED ORDER — ONDANSETRON HCL (PF) 4 MG/2 ML IJ SOLN
4 mg | INTRAVENOUS | 0 refills | Status: DC | PRN
Start: 2019-02-15 — End: 2019-02-18
  Administered 2019-02-17: 16:00:00 4 mg via INTRAVENOUS

## 2019-02-15 MED ORDER — OXYBUTYNIN CHLORIDE 10 MG PO TR24
10 mg | Freq: Every day | ORAL | 0 refills | Status: DC
Start: 2019-02-15 — End: 2019-02-18
  Administered 2019-02-16 – 2019-02-18 (×2): 10 mg via ORAL

## 2019-02-15 MED ORDER — BISACODYL 10 MG RE SUPP
10 mg | Freq: Every day | RECTAL | 0 refills | Status: DC | PRN
Start: 2019-02-15 — End: 2019-02-18

## 2019-02-15 MED ORDER — ATORVASTATIN 10 MG PO TAB
10 mg | Freq: Every day | ORAL | 0 refills | Status: DC
Start: 2019-02-15 — End: 2019-02-18
  Administered 2019-02-16 – 2019-02-18 (×3): 10 mg via ORAL

## 2019-02-15 MED ORDER — POLYETHYLENE GLYCOL 3350 17 GRAM PO PWPK
1 | Freq: Every day | ORAL | 0 refills | Status: DC | PRN
Start: 2019-02-15 — End: 2019-02-18

## 2019-02-15 MED ORDER — MELATONIN 3 MG PO TAB
3 mg | Freq: Every evening | ORAL | 0 refills | Status: DC | PRN
Start: 2019-02-15 — End: 2019-02-16
  Administered 2019-02-16: 03:00:00 3 mg via ORAL

## 2019-02-15 MED ORDER — PANTOPRAZOLE 40 MG PO TBEC
40 mg | Freq: Every day | ORAL | 0 refills | Status: DC
Start: 2019-02-15 — End: 2019-02-18
  Administered 2019-02-15 – 2019-02-18 (×4): 40 mg via ORAL

## 2019-02-15 MED ORDER — ENOXAPARIN 30 MG/0.3 ML SC SYRG
30 mg | Freq: Two times a day (BID) | SUBCUTANEOUS | 0 refills | Status: DC
Start: 2019-02-15 — End: 2019-02-16
  Administered 2019-02-16: 03:00:00 30 mg via SUBCUTANEOUS

## 2019-02-15 MED ORDER — ZINC SULFATE 220 (50) MG PO CAP
220 mg | Freq: Every day | ORAL | 0 refills | Status: DC
Start: 2019-02-15 — End: 2019-02-18
  Administered 2019-02-16 – 2019-02-18 (×3): 220 mg via ORAL

## 2019-02-15 MED ORDER — DEXAMETHASONE SODIUM PHOSPHATE 10 MG/ML IJ SOLN
6 mg | Freq: Once | INTRAVENOUS | 0 refills | Status: CP
Start: 2019-02-15 — End: ?
  Administered 2019-02-15: 19:00:00 6 mg via INTRAVENOUS

## 2019-02-15 MED ORDER — AMLODIPINE 5 MG PO TAB
2.5 mg | Freq: Two times a day (BID) | ORAL | 0 refills | Status: DC
Start: 2019-02-15 — End: 2019-02-18
  Administered 2019-02-16 – 2019-02-18 (×5): 2.5 mg via ORAL

## 2019-02-15 MED ORDER — GUAIFENESIN 100 MG/5 ML PO LIQD
100 mg | ORAL | 0 refills | Status: DC | PRN
Start: 2019-02-15 — End: 2019-02-16
  Administered 2019-02-16 (×2): 100 mg via ORAL

## 2019-02-15 NOTE — Care Plan
Problem: Discharge Planning  Goal: Participation in plan of care  Outcome: Goal Ongoing  Flowsheets (Taken 02/15/2019 1558)  Participation in Plan of Care: Involve patient/caregiver in care planning decision making  Goal: Knowledge regarding plan of care  Outcome: Goal Ongoing  Flowsheets (Taken 02/15/2019 1558)  Knowledge regarding plan of care:   Provide fall prevention education   Provide plan of care education   Provide infection prevention education   Provide VTE signs and symptoms education   Provide admission education to parent/caregiver  Goal: Prepared for discharge  Outcome: Goal Ongoing  Flowsheets (Taken 02/15/2019 1558)  Prepared for discharge:   Complete ADL ability assessment   Provide safe use medical equipment education   Provide diet and oral health education   Collaborate with multidisciplinary team for hospital discharge coordination     Problem: Infection, Risk of  Goal: Absence of infection  Outcome: Goal Ongoing  Flowsheets (Taken 02/15/2019 1558)  Absence of infection:   Assess for infection (Monitor SIRS Criteria)   Monitor for signs and symptoms of infection  Goal: Knowledge of Infection Control Procedures  Outcome: Goal Ongoing  Flowsheets (Taken 02/15/2019 1558)  Knowledge of Infection Control procedures: Provide Isolation Precautions Education

## 2019-02-15 NOTE — Progress Notes
RN attempted to get report. Maggie RN to call back.

## 2019-02-15 NOTE — ED Notes
Walking pulse in room. O2 sats 94% at beginning. Dropped to 90% during walk; reports dizziness.

## 2019-02-15 NOTE — Patient Education
Medication Education    Alicia Fox accepted counseling and was engaged.  She verbalized understanding.    The following medications were discussed: zofran    Where indicated, Alicia Fox was provided with additional medication and/or disease-state information.  All patient questions were answered and Alicia Fox acknowledged understanding of the medications, side effects and other pertinent medication information.    Follow up should occur as needed.    Continue to address: certain medications    Patient was also educated on fall risk precautions and infection control.     Michela Pitcher, RN  02/15/2019

## 2019-02-15 NOTE — Progress Notes
Patient arrived on unit via wheelchair accompanied by transport. Patient transferred to the bed without assistance. Assessment completed, refer to flowsheet for details. Orders released, reviewed, and implemented as appropriate. Oriented to surroundings, call light within reach. Plan of care reviewed.  Will continue to monitor and assess.

## 2019-02-15 NOTE — Progress Notes
Patient arrived to room [phone] via wheelchair accompanied by transport. Patient transferred to the bed without assistance. Bedside safety checks completed. Initial patient assessment completed. Refer to flowsheet for details.    Admission skin assessment completed with: Dru Coleman    Pressure injury present on arrival?: No    1. Head/Face/Neck: No  2. Trunk/Back: No  3. Upper Extremities: No  4. Lower Extremities: No  5. Pelvic/Coccyx: No  6. Assessed for device associated injury? Yes  7. Malnutrition Screening Tool (Nursing Nutrition Assessment) Completed? Yes    See Doc Flowsheet for additional wound details.     INTERVENTIONS:

## 2019-02-15 NOTE — H&P (View-Only)
Name:  Alicia Fox                                             MRN:  [id number]   Admission Date:  02/15/2019                     Assessment/Plan:        COVID pneumonia/acute hypoxic respiratory failure  - 10 days of generalized body aches, cough, sob, diagnosed with covid at outside facility 7 days ago  - repeat covid in ed pending  - chest xray: Development of patchy ill-defined opacities within both lung bases, likely pneumonia related to reported COVID 19  - placed on 2-3 l NC  - started on decadron 02/15/2019  - inflammatory markers pending    Left lower lobe carcinoid  - On 03/21/17 she underwent a?Robotic assisted left lower lobectomy  - follows with CTS, last seen 09/21/18: Follow up in 1 year for continued carcinoid surveillance with CT chest    HTN/HLD  - continue PTA statin and norvasc/losartan    Hypothyroidism  - continue PTA synthroid    OSA  - CPAP  ______________________________________________________________________________    Primary Care Physician: Hiram Gash     Chief Complaint: covid 1 week ago    History of Present Illness: Alicia Fox is a 68 y.o. female with PMH of as below presented to ed with 10 days of generalized body aches, cough, sob, diagnosed with covid at outside facility 7 days ago, and since then her symptoms has been worsening, she doesn't use oxygen at home, but reports that along with worsening of sob her spo2 has been dropping to high 80's in the last few days. She denies any fever and chills, no chest pain. In ED she was rested for covid, was found to be hypoxic with exertion, received one dose of decadron in ed, chest xray showed b/l opacities.     Medical History:   Diagnosis Date   ? Arthritis     lumbar, knees   ? Cancer [facility])     breast   ? Carpal tunnel syndrome on both sides 2018    L > R   ? Disorder of thyroid gland     hypothyroid   ? Gastrointestinal disorder     gerd at times-under control with meds   ? HTN (hypertension)    ? Hyperlipidemia ? Pneumonia     bilateral bacterial   ? Psychiatric illness     depression     Surgical History:   Procedure Laterality Date   ? HX MASTECTOMY Bilateral 1986    1986-stomberg mastectomy   ? BREAST BIOPSY  07/1984   ? MASTECTOMY, PARTIAL Bilateral 12/1984   ? MASTECTOMY, MODIFIED RADICAL Bilateral 1987    1987-full modified   ? BREAST SURGERY Bilateral 07/1985    implants   ? HX DILATION AND CURETTAGE  07/1994   ? HX HYSTERECTOMY  10/1994    complete   ? HX ABDOMINOPLASTY  10/1994   ? HIP SURGERY  02/1995   ? HX LIPOMA RESECTION  11/1999    outer upper left arm   ? ANUS SURGERY  01/2010    anal fissure   ? HX LIPOMA RESECTION  02/2010    inside upper right arm   ? LEFT DECOMPRESSION NERVE MEDIAN WITH CARPAL TUNNEL  RELEASE Left 10/17/2016    Performed by Delice Lesch, MD at [facility] OR   ? BRONCHOSCOPY N/A 01/13/2017    Performed by Audie Box, MD at [facility] OR   ? BRONCHOSCOPY WITH TRANSBRONCHIAL BIOPSY N/A 01/13/2017    Performed by Audie Box, MD at [facility] OR   ? BRONCHOSCOPY WITH ENDOBRONCHIAL ULTRASOUND GUIDED TRANSTRACHEAL/ TRANSBRONCHIAL SAMPLING - 3 OR MORE MEDIASTINAL/ HILAR LYMPH NODE STATIONS/ STRUCTURE - RIGID N/A 01/13/2017    Performed by Audie Box, MD at [facility] OR   ? BRONCHOSCOPY WITH IMAGE - GUIDED NAVIGATION - FLEXIBLE N/A 01/13/2017    Performed by Audie Box, MD at [facility] OR   ? ROBOTIC LEFT LOWER LOBECTOMY Left 03/21/2017    Performed by Particia Nearing, MD at [facility]OR   ? SPINE SURGERY  1993 and 1996    cervical fusion     Family History   Problem Relation Age of Onset   ? Hypertension Mother    ? Atrial Fibrillation Mother    ? Hypertension Father    ? COPD Father    ? Heart Failure Father    ? Kidney Cancer Brother    ? Cancer-Prostate Brother      Social History     Socioeconomic History   ? Marital status: Widowed     Spouse name: Not on file   ? Number of children: Not on file   ? Years of education: Not on file   ? Highest education level: Not on file   Occupational History ? Occupation: retired   Tobacco Use   ? Smoking status: Former Smoker     Quit date: 03/04/1989     Years since quitting: 29.9   ? Smokeless tobacco: Never Used   ? Tobacco comment: quit in 1991   Substance and Sexual Activity   ? Alcohol use: Not Currently     Comment: very very rare- 2 years since last drink   ? Drug use: No   ? Sexual activity: Not on file   Other Topics Concern   ? Not on file   Social History Narrative   ? Not on file      Immunizations (includes history and patient reported):   Immunization History   Administered Date(s) Administered   ? Pneumococcal Vaccine(13-Val Peds/immunocompromised adult) 02/19/2018   ? Zoster Vaccine Recombinant [facility] - HISTORICAL), Adjuvanted (Shingles) IM 01/22/2018           Allergies:  Codeine    Medications:  (Not in a hospital admission)    Review of Systems:  A comprehensive  12 point review of organ systems reviewed and was negative except for the ones mentioned in HOPI    Physical Exam:  Vital Signs: Last Filed In 24 Hours Vital Signs: 24 Hour Range   BP: 137/71 (12/14 1330)  Temp: 37.2 ?C (98.9 ?F) (12/14 1117)  Pulse: 96 (12/14 1330)  Respirations: 28 PER MINUTE (12/14 1330)  SpO2: 93 % (12/14 1230)  SpO2 Pulse: 92 (12/14 1230)  Height: 160 cm (63) (12/14 1117) BP: (137-186)/(71-93)   Temp:  [37.2 ?C (98.9 ?F)]   Pulse:  [89-96]   Respirations:  [16 PER MINUTE-28 PER MINUTE]   SpO2:  [93 %-95 %]    Intensity Pain Scale (Self Report): 3 (02/15/19 1119)      General:  Alert, awake, oriented x 3 , cooperative, no distress, appears stated age  Head:  Normocephalic, without obvious abnormality, atraumatic  Eyes:  Conjunctivae/corneas clear   Nose: Nares  normal. Mucosa normal.  No drainage or sinus tenderness  Throat: Lips, mucosa and tongue normal  Neck:    Supple, symmetrical, trachea midline, no adenopathy, thyroid: no enlargement/tenderness/nodules  Lungs:  Clear to auscultation bilaterally  Heart:   Regular rate and rhythm, S1, S2 normal, no murmur Abdomen:  Soft, non-tender.  Bowel sounds normal.  No masses.  No organomegaly.  Extremities: Extremities normal, atraumatic, no cyanosis or edema  Skin: Skin color, texture, turgor normal.    Lymph nodes:  Cervical, supraclavicular and axillary nodes normal  Neurologic: Non focal grossly    Lab/Radiology/Other Diagnostic Tests:  24-hour labs:    Results for orders placed or performed during the hospital encounter of 02/15/19 (from the past 24 hour(s))   POC TROPONIN    Collection Time: 02/15/19 12:27 PM   Result Value Ref Range    Troponin-I-POC 0.00 0.00 - 0.05 NG/ML   CBC AND DIFF    Collection Time: 02/15/19 12:28 PM   Result Value Ref Range    White Blood Cells 6.3 4.5 - 11.0 K/UL    RBC 5.64 (H) 4.0 - 5.0 M/UL    Hemoglobin 12.8 12.0 - 15.0 GM/DL    Hematocrit 16.1 36 - 45 %    MCV 68.7 (L) 80 - 100 FL    MCH 22.7 (L) 26 - 34 PG    MCHC 33.1 32.0 - 36.0 G/DL    RDW 09.6 11 - 15 %    Platelet Count 234 150 - 400 K/UL    MPV 7.8 7 - 11 FL    Neutrophils 72 41 - 77 %    Lymphocytes 16 (L) 24 - 44 %    Monocytes 12 4 - 12 %    Eosinophils 0 0 - 5 %    Basophils 0 0 - 2 %    Absolute Neutrophil Count 4.50 1.8 - 7.0 K/UL    Absolute Lymph Count 1.00 1.0 - 4.8 K/UL    Absolute Monocyte Count 0.77 0 - 0.80 K/UL    Absolute Eosinophil Count 0.00 0 - 0.45 K/UL    Absolute Basophil Count 0.03 0 - 0.20 K/UL    MDW (Monocyte Distribution Width) 23.6 (H) <20.7   COMPREHENSIVE METABOLIC PANEL    Collection Time: 02/15/19 12:28 PM   Result Value Ref Range    Sodium 135 (L) 137 - 147 MMOL/L    Potassium 3.8 3.5 - 5.1 MMOL/L    Chloride 99 98 - 110 MMOL/L    Glucose 169 (H) 70 - 100 MG/DL    Blood Urea Nitrogen 11 7 - 25 MG/DL    Creatinine 0.45 0.4 - 1.00 MG/DL    Calcium 9.9 8.5 - 40.9 MG/DL    Total Protein 7.4 6.0 - 8.0 G/DL    Total Bilirubin 0.4 0.3 - 1.2 MG/DL    Albumin 4.0 3.5 - 5.0 G/DL    Alk Phosphatase 811 (H) 25 - 110 U/L    AST (SGOT) 17 7 - 40 U/L    CO2 27 21 - 30 MMOL/L    ALT (SGPT) 17 7 - 56 U/L Anion Gap 9 3 - 12    eGFR Non African American >60 >60 mL/min    eGFR African American >60 >60 mL/min   POC LACTATE    Collection Time: 02/15/19 12:30 PM   Result Value Ref Range    LACTIC ACID POC 2.0 0.5 - 2.0 MMOL/L     Glucose: (!) 169 (02/15/19 1228)  Pertinent radiology  reviewed.    Chest Single View    Result Date: 02/15/2019  Development of patchy ill-defined opacities within both lung bases, likely pneumonia related to reported COVID 19.  Finalized by Francis Dowse, M.D. on 02/15/2019 12:50 PM. Dictated by Francis Dowse, M.D. on 02/15/2019 12:48 PM.

## 2019-02-16 MED ORDER — ENOXAPARIN 40 MG/0.4 ML SC SYRG
40 mg | Freq: Two times a day (BID) | SUBCUTANEOUS | 0 refills | Status: DC
Start: 2019-02-16 — End: 2019-02-18
  Administered 2019-02-17 – 2019-02-18 (×3): 40 mg via SUBCUTANEOUS

## 2019-02-16 MED ORDER — IPRATROPIUM-ALBUTEROL 20-100 MCG/ACTUATION IN MIST
1 | Freq: Four times a day (QID) | RESPIRATORY_TRACT | 0 refills | Status: DC
Start: 2019-02-16 — End: 2019-02-17
  Administered 2019-02-17: 03:00:00 1 via RESPIRATORY_TRACT

## 2019-02-16 MED ORDER — TRAZODONE 50 MG PO TAB
50 mg | Freq: Every evening | ORAL | 0 refills | Status: DC | PRN
Start: 2019-02-16 — End: 2019-02-18
  Administered 2019-02-17 – 2019-02-18 (×2): 50 mg via ORAL

## 2019-02-16 MED ORDER — MELATONIN 5 MG PO TAB
10 mg | Freq: Every evening | ORAL | 0 refills | Status: DC
Start: 2019-02-16 — End: 2019-02-18
  Administered 2019-02-17 – 2019-02-18 (×2): 10 mg via ORAL

## 2019-02-16 MED ORDER — GUAIFENESIN 600 MG PO TA12
600 mg | Freq: Two times a day (BID) | ORAL | 0 refills | Status: DC
Start: 2019-02-16 — End: 2019-02-18
  Administered 2019-02-17 – 2019-02-18 (×4): 600 mg via ORAL

## 2019-02-16 NOTE — Care Plan
Problem: Discharge Planning  Goal: Participation in plan of care  Outcome: Goal Ongoing  Flowsheets (Taken 02/15/2019 2351)  Participation in Plan of Care: Involve patient/caregiver in care planning decision making  Goal: Knowledge regarding plan of care  Outcome: Goal Ongoing  Flowsheets (Taken 02/15/2019 2351)  Knowledge regarding plan of care:   Provide procedural and treatment education   Provide infection prevention education   Provide VTE signs and symptoms education   Provide plan of care education   Provide fall prevention education   Provide medication management education  Goal: Prepared for discharge  Outcome: Goal Ongoing  Flowsheets (Taken 02/15/2019 1558 by Michela Pitcher, RN)  Prepared for discharge:   Complete ADL ability assessment   Provide safe use medical equipment education   Provide diet and oral health education   Collaborate with multidisciplinary team for hospital discharge coordination     Problem: Infection, Risk of  Goal: Absence of infection  Outcome: Goal Ongoing  Flowsheets (Taken 02/15/2019 1558 by Michela Pitcher, RN)  Absence of infection:   Assess for infection (Monitor SIRS Criteria)   Monitor for signs and symptoms of infection  Goal: Knowledge of Infection Control Procedures  Outcome: Goal Ongoing  Flowsheets (Taken 02/15/2019 1558 by Michela Pitcher, RN)  Knowledge of Infection Control procedures: Provide Isolation Precautions Education

## 2019-02-16 NOTE — Patient Education
Medication Education    Kennedy A "Marge" Angerman accepted counseling and was interactive.  She verbalized understanding.    The following medications were discussed:  Amlodipine  Lovenox  Cozaar  Melatonin  Robitussin    Where indicated, Ms. Felten was provided with additional medication and/or disease-state information.  All patient questions were answered and Ms. Byers acknowledged understanding of the medications, side effects and other pertinent medication information.    Follow up should occur as needed.    Continue to address: certain medications.    Also discussed with patient:  Signs/symptoms of VTE and prophylaxis.  POC and discharge planning.   Fall bundle and patient safety.    Jobe Marker, RN  02/15/2019

## 2019-02-17 MED ORDER — IPRATROPIUM BROMIDE 0.02 % IN SOLN
.5 mg | Freq: Four times a day (QID) | RESPIRATORY_TRACT | 0 refills | Status: DC | PRN
Start: 2019-02-17 — End: 2019-02-18
  Administered 2019-02-17 – 2019-02-18 (×4): 0.5 mg via RESPIRATORY_TRACT

## 2019-02-17 MED ORDER — LIDOCAINE 5 % TP PTMD
1 | Freq: Every day | TOPICAL | 0 refills | Status: DC
Start: 2019-02-17 — End: 2019-02-18
  Administered 2019-02-17 – 2019-02-18 (×2): 1 via TOPICAL

## 2019-02-17 MED ORDER — DEXAMETHASONE 4 MG PO TAB
6 mg | Freq: Every day | ORAL | 0 refills | Status: DC
Start: 2019-02-17 — End: 2019-02-18
  Administered 2019-02-18: 15:00:00 6 mg via ORAL

## 2019-02-17 MED ORDER — ALBUTEROL SULFATE 2.5 MG/0.5 ML IN NEBU
2.5 mg | Freq: Four times a day (QID) | RESPIRATORY_TRACT | 0 refills | Status: DC | PRN
Start: 2019-02-17 — End: 2019-02-18
  Administered 2019-02-17 – 2019-02-18 (×5): 2.5 mg via RESPIRATORY_TRACT

## 2019-02-18 ENCOUNTER — Encounter: Admit: 2019-02-18 | Discharge: 2019-02-18 | Payer: MEDICARE

## 2019-02-18 MED ORDER — FUROSEMIDE 20 MG PO TAB
20 mg | ORAL_TABLET | Freq: Every day | ORAL | 1 refills | 90.00000 days | Status: DC | PRN
Start: 2019-02-18 — End: 2019-05-05

## 2019-02-18 MED ORDER — ONDANSETRON HCL 4 MG PO TAB
4 mg | ORAL_TABLET | ORAL | 0 refills | 8.00000 days | Status: DC | PRN
Start: 2019-02-18 — End: 2019-05-05
  Filled 2019-02-18: qty 30, 10d supply, fill #1

## 2019-02-18 MED ORDER — ALBUTEROL SULFATE 2.5 MG /3 ML (0.083 %) IN NEBU
2.5 mg | RESPIRATORY_TRACT | 0 refills | Status: DC | PRN
Start: 2019-02-18 — End: 2019-03-09
  Filled 2019-02-18: qty 150, 13d supply, fill #1

## 2019-02-18 MED ORDER — ACETAMINOPHEN 325 MG PO TAB
650 mg | ORAL | 0 refills | Status: AC | PRN
Start: 2019-02-18 — End: ?
  Filled 2019-05-18: qty 60, 3d supply, fill #1

## 2019-02-18 MED ORDER — LIDOCAINE 5 % TP PTMD
1 | MEDICATED_PATCH | Freq: Every day | TOPICAL | 0 refills | 30.00000 days | Status: DC
Start: 2019-02-18 — End: 2019-05-05

## 2019-02-18 MED ORDER — DEXAMETHASONE 6 MG PO TAB
6 mg | ORAL_TABLET | Freq: Every day | ORAL | 0 refills | 12.50000 days | Status: DC
Start: 2019-02-18 — End: 2019-03-09
  Filled 2019-02-18: qty 6, 6d supply, fill #1

## 2019-02-18 MED ORDER — IPRATROPIUM BROMIDE 0.02 % IN SOLN
1 | RESPIRATORY_TRACT | 0 refills | 30.00000 days | Status: DC | PRN
Start: 2019-02-18 — End: 2019-03-12
  Filled 2019-02-18: qty 60, 13d supply, fill #1

## 2019-02-18 MED ORDER — GUAIFENESIN 600 MG PO TA12
600 mg | ORAL_TABLET | Freq: Two times a day (BID) | ORAL | 0 refills | 14.00000 days | Status: DC
Start: 2019-02-18 — End: 2019-10-01

## 2019-02-18 MED ORDER — TRAZODONE 50 MG PO TAB
50 mg | ORAL_TABLET | Freq: Every evening | ORAL | 0 refills | Status: DC | PRN
Start: 2019-02-18 — End: 2019-03-24
  Filled 2019-02-18: qty 30, 30d supply, fill #1

## 2019-02-18 MED ORDER — BENZONATATE 100 MG PO CAP
100 mg | ORAL_CAPSULE | Freq: Three times a day (TID) | ORAL | 0 refills | 9.00000 days | Status: DC | PRN
Start: 2019-02-18 — End: 2019-05-05

## 2019-02-19 ENCOUNTER — Encounter: Admit: 2019-02-19 | Discharge: 2019-02-19 | Payer: MEDICARE

## 2019-02-19 NOTE — Progress Notes
Left message for pt regarding Rand return call from pt.

## 2019-02-19 NOTE — Telephone Encounter
Hospital Discharge Follow Up      Reached Patient:Yes     Admission Information:     Hospital Name: [facility]  Admission Date: 02/15/2019  Discharge Date: 02/18/2019  Admission Diagnosis: Shortness of breath / cough  Discharge Diagnosis: Pneumonia 2/2 Covid virus  Has there been a discharge within the last 30 days? No  If yes, reason:   Hospital Services: Unplanned  Today's call is 1(business) days post discharge      Discharge Instruction Review   Did patient receive and understand discharge instructions? Yes    Home Health ordered? No                 Agency name/telephone number:    Has Home Health agency contacted patient? No   Caregiver assistance in the home? No and Lives with fam   Are there concerns regarding the patient's ADL'S? No  Is patient a fall risk? Yes    Special diet? No If yes, type: reg      Medication Reconciliation    Changes to pre-hospital medications? Yes  Were new prescriptions filled?Yes  Meds reviewed and reconciled?Yes  ? acetaminophen (TYLENOL) 325 mg tablet Take two tablets by mouth every 6 hours as needed. For fever or pain.   ? albuterol 0.083% (PROVENTIL) 2.5 mg /3 mL (0.083 %) nebulizer solution Inhale 3 mL solution by nebulizer as directed every 6 hours as needed for Shortness of Breath or Wheezing.   ? amLODIPine (NORVASC) 2.5 mg tablet Take one tablet by mouth twice daily.   ? ascorbic acid (VITAMIN C) 500 mg tablet Take 500 mg by mouth daily.   ? aspirin EC 81 mg tablet Take 1 Tab by mouth daily.   ? atorvastatin (LIPITOR) 10 mg tablet TAKE 1 TABLET BY MOUTH DAILY (Patient taking differently: Take 10 mg by mouth daily.)   ? benzonatate (TESSALON PERLES) 100 mg capsule Take one capsule by mouth three times daily as needed for Cough.   ? cetirizine (ZYRTEC) 10 mg tablet Take 10 mg by mouth daily.   ? cholecalciferol (VITAMIN D-3) 1,000 units tablet Take 1,000 Units by mouth daily. ? dexAMETHasone (DECADRON) 6 mg tablet Take one tablet by mouth daily. Take with food.   ? Folic Acid 800 mcg tab Take 1 tablet by mouth daily.   ? furosemide (LASIX) 20 mg tablet Take one tablet by mouth daily as needed. Hold this medication until you are feeling better from your infection.   ? glucosamine HCl/chondroitin su (GLUCOSAMINE-CHONDROITIN PO) Take 1 tablet by mouth daily.   ? guaiFENesin LA (MUCINEX) 600 mg tablet Take one tablet by mouth twice daily.   ? ipratropium bromide (ATROVENT) 0.02 % nebulizer solution Inhale 2.5 mL by mouth into the lungs every 6 hours as needed for Shortness of Breath or Wheezing.   ? levothyroxine (SYNTHROID) 88 mcg tablet TAKE 1 TABLET BY MOUTH EVERY DAY IN THE MORNING   ? lidocaine (LIDODERM) 5 % topical patch Apply one patch topically to affected area daily. Apply patch for 12 hours, then remove for 12 hours before repeating.   ? losartan (COZAAR) 100 mg tablet TAKE 1 TABLET BY MOUTH TWICE DAILY.   ? melatonin 10 mg cap Take 10 mg by mouth daily.   ? ondansetron (ZOFRAN) 4 mg tablet Take one tablet by mouth every 8 hours as needed for Nausea or Vomiting.   ? oxybutynin XL (DITROPAN XL) 10 mg tablet TAKE 1 TABLET BY MOUTH EVERY DAY   ?  pantoprazole DR (PROTONIX) 40 mg tablet TAKE 1 TABLET BY MOUTH EVERY DAY   ? peg-electrolyte solution (NULYTELY) 420 gram oral solution Take 4,000 mL by mouth as directed. Take as directed per physician's office   ? polycarbophil (FIBERCON) 625 mg tablet Take 625 mg by mouth twice daily.   ? RESVERATROL-QUERCETIN PO Take 500 mg by mouth daily.   ? traZODone (DESYREL) 50 mg tablet Take one tablet by mouth at bedtime as needed.   ? vit C,E-Zn-coppr-lutein-zeaxan (PRESERVISION AREDS-2) 250-200-40-1 mg-unit-mg-mg cap Take 1 capsule by mouth twice daily.   ? zinc sulfate 220 mg (50 mg elemental zinc) capsule Take 220 mg by mouth daily.         Understanding Condition   Having any current symptoms? Yes, coughing and wheezing. Patient understands when to seek additional medical care? Yes   Other instructions provided : Alicia Fox says she is having trouble with her nebulizer machine. She and her daughter are going to try again later today to make it work. She is encouraged to contact the pharmacy if it is not working properly for a new machine. She v/u.   Alicia Fox says she is very fatigue but was able to sleep well last night. She is getting ready to eat her breakfast. Alicia Fox has audible wheezing with inhalation. She also has a cough. Reviewed the AVS. She is adherent with her meds. Alicia Fox requested face-to-face HFU with her PCP. Scheduled for next week. All questions answered.   Alicia Fox is encouraged to drink plenty of water for hydration, avoid sugar, and eat a nutritious plan. She v/u.    Alicia Fox received a message regarding the Covid Questionnaire. Alicia Fox says she will call back later after she has eaten and rested.      Scheduling Follow-up Appointment   Upcoming appointment date and time and with whom scheduled:   Future Appointments   Date Time Provider Department Center   02/25/2019 10:30 AM Hiram Gash, MD [facility]   03/02/2019  4:00 PM Dorris Fetch, MD [facility] C[facility] Exam   03/15/2019 11:00 AM Cox, Benjamine Mola, MS [facility] [place] Exam   04/19/2019 10:00 AM Cox, Benjamine Mola, MS [facility] [place] Exam   09/07/2019 11:45 AM [facility] Radiolog   09/07/2019  1:45 PM Nagji, Oleh Genin, MD [email or web address] CTS     PCP appointment scheduled?Yes, Date: 02/25/2019   PCP primary location: [facility]  Specialist appointment scheduled? Yes, with Cardiology 03/02/2019  Both PCP and Specialist appointment scheduled: Yes  Is assistance with transportation needed?No      Ewing Schlein, RN

## 2019-02-20 ENCOUNTER — Encounter: Admit: 2019-02-20 | Discharge: 2019-02-20 | Payer: MEDICARE

## 2019-02-22 ENCOUNTER — Encounter: Admit: 2019-02-22 | Discharge: 2019-02-22 | Payer: MEDICARE

## 2019-02-22 NOTE — Progress Notes
Left message for pt regarding [facility] monitoring s/p dc from hospital. Awaiting return call from pt.

## 2019-02-23 ENCOUNTER — Encounter: Admit: 2019-02-23 | Discharge: 2019-02-23 | Payer: MEDICARE

## 2019-02-23 MED ORDER — LOSARTAN 100 MG PO TAB
ORAL_TABLET | Freq: Two times a day (BID) | ORAL | 2 refills | 30.00000 days | Status: DC
Start: 2019-02-23 — End: 2019-05-28

## 2019-02-25 ENCOUNTER — Encounter: Admit: 2019-02-25 | Discharge: 2019-02-25 | Payer: MEDICARE

## 2019-02-25 ENCOUNTER — Ambulatory Visit: Admit: 2019-02-25 | Discharge: 2019-02-25 | Payer: MEDICARE

## 2019-02-25 DIAGNOSIS — E079 Disorder of thyroid, unspecified: Secondary | ICD-10-CM

## 2019-02-25 DIAGNOSIS — U071 Pneumonia due to COVID-19 virus: Secondary | ICD-10-CM

## 2019-02-25 DIAGNOSIS — Z09 Encounter for follow-up examination after completed treatment for conditions other than malignant neoplasm: Secondary | ICD-10-CM

## 2019-02-25 DIAGNOSIS — G5603 Carpal tunnel syndrome, bilateral upper limbs: Secondary | ICD-10-CM

## 2019-02-25 DIAGNOSIS — F99 Mental disorder, not otherwise specified: Secondary | ICD-10-CM

## 2019-02-25 DIAGNOSIS — I10 Essential (primary) hypertension: Secondary | ICD-10-CM

## 2019-02-25 DIAGNOSIS — G933 Postviral fatigue syndrome: Secondary | ICD-10-CM

## 2019-02-25 DIAGNOSIS — J189 Pneumonia, unspecified organism: Secondary | ICD-10-CM

## 2019-02-25 DIAGNOSIS — M1990 Unspecified osteoarthritis, unspecified site: Secondary | ICD-10-CM

## 2019-02-25 DIAGNOSIS — E785 Hyperlipidemia, unspecified: Secondary | ICD-10-CM

## 2019-02-25 DIAGNOSIS — C801 Malignant (primary) neoplasm, unspecified: Secondary | ICD-10-CM

## 2019-02-25 DIAGNOSIS — K929 Disease of digestive system, unspecified: Secondary | ICD-10-CM

## 2019-03-01 ENCOUNTER — Encounter: Admit: 2019-03-01 | Discharge: 2019-03-01 | Payer: MEDICARE

## 2019-03-08 ENCOUNTER — Encounter: Admit: 2019-03-08 | Discharge: 2019-03-08 | Payer: MEDICARE

## 2019-03-08 NOTE — Telephone Encounter
Pt called stating that she is still coughing, has headache, earache.  Wanted to see if dr Joaquim Lai wanted to see her in the office this week. She states she is afebrile and is not SOB.  Wonders about her earache and pneumonia.  Denies chills or muscle aches.  Cough is nagging and tesselon perles dont seem to help and is allergic to codeine

## 2019-03-09 ENCOUNTER — Ambulatory Visit: Admit: 2019-03-09 | Discharge: 2019-03-10 | Payer: MEDICARE

## 2019-03-09 ENCOUNTER — Encounter: Admit: 2019-03-09 | Discharge: 2019-03-09 | Payer: MEDICARE

## 2019-03-09 DIAGNOSIS — K929 Disease of digestive system, unspecified: Secondary | ICD-10-CM

## 2019-03-09 DIAGNOSIS — G5603 Carpal tunnel syndrome, bilateral upper limbs: Secondary | ICD-10-CM

## 2019-03-09 DIAGNOSIS — R519 Acute nonintractable headache, unspecified headache type: Secondary | ICD-10-CM

## 2019-03-09 DIAGNOSIS — R05 Cough: Secondary | ICD-10-CM

## 2019-03-09 DIAGNOSIS — J189 Pneumonia, unspecified organism: Secondary | ICD-10-CM

## 2019-03-09 DIAGNOSIS — E785 Hyperlipidemia, unspecified: Secondary | ICD-10-CM

## 2019-03-09 DIAGNOSIS — M1990 Unspecified osteoarthritis, unspecified site: Secondary | ICD-10-CM

## 2019-03-09 DIAGNOSIS — C801 Malignant (primary) neoplasm, unspecified: Secondary | ICD-10-CM

## 2019-03-09 DIAGNOSIS — E079 Disorder of thyroid, unspecified: Secondary | ICD-10-CM

## 2019-03-09 DIAGNOSIS — I10 Essential (primary) hypertension: Secondary | ICD-10-CM

## 2019-03-09 DIAGNOSIS — F99 Mental disorder, not otherwise specified: Secondary | ICD-10-CM

## 2019-03-09 MED ORDER — GABAPENTIN 100 MG PO CAP
100 mg | ORAL_CAPSULE | Freq: Two times a day (BID) | ORAL | 0 refills | Status: DC
Start: 2019-03-09 — End: 2019-04-06

## 2019-03-09 MED ORDER — ALBUTEROL SULFATE 2.5 MG /3 ML (0.083 %) IN NEBU
2.5 mg | RESPIRATORY_TRACT | 0 refills | Status: DC | PRN
Start: 2019-03-09 — End: 2019-03-12

## 2019-03-09 MED ORDER — ALBUTEROL SULFATE 2.5 MG /3 ML (0.083 %) IN NEBU
2.5 mg | RESPIRATORY_TRACT | 0 refills | Status: DC | PRN
Start: 2019-03-09 — End: 2019-03-09

## 2019-03-09 NOTE — Progress Notes
Telehealth Visit Note    Date of Service: 03/09/2019      Subjective:      Obtained patient's verbal consent to treat them and their agreement to [facility] financial policy and NPP via this telehealth visit during the [facility]       Alicia A Charlcie Cradle Fox is a 69 y.o. female.  Patient made a telehealth to discuss her symptoms.  She complains of a headache that is unrelenting.  She has tried multiple things without any relief.  The trazodone is not helping.  She has no history of migraines in the past.  Patient states she mostly just holds her head with her hand.  She states that the headache is worse whenever she coughs or when she touches her head.  She denies any fevers.  Patient also has an ongoing cough and right ear pain.  She has not tried any NyQuil but is agreeable to this.  Overall, she is slowly improving but still has fatigue and brain fog secondary to Covid infection.    Chief Complaint   Patient presents with   ? Migraine     since Saturday   ? Cough   ? Ear Pain      Patient Reported Other  What medical problem brings you in to see the provider? debilitating headache. no relief even with trazodone, tylenol, advil, ice pack. feels like a migraine.  Cough is constant which does not help headache.. This is a recurrent problem. The current episode started 1 to 4 weeks ago. The problem occurs constantly. The problem has been rapidly worsening. The pain score is: 10/10. The pain severity is severe.  Associated symptoms include coughing, fatigue, headaches, neck pain, swollen glands and weakness. The symptoms are aggravated by coughing. She has tried acetaminophen, ice, immobilization, lying down, NSAIDs, oral narcotics, position changes, relaxation, rest and sleep for the symptoms. The treatment provided no relief.            Review of Systems   Constitutional: Positive for fatigue.   Respiratory: Positive for cough.    Musculoskeletal: Positive for neck pain. Neurological: Positive for weakness and headaches.         Objective:         ? acetaminophen (TYLENOL) 325 mg tablet Take two tablets by mouth every 6 hours as needed. For fever or pain.   ? albuterol 0.083% (PROVENTIL) 2.5 mg /3 mL (0.083 %) nebulizer solution Inhale 3 mL solution by nebulizer as directed every 6 hours as needed for Shortness of Breath or Wheezing.   ? amLODIPine (NORVASC) 2.5 mg tablet Take one tablet by mouth twice daily.   ? ascorbic acid (VITAMIN C) 500 mg tablet Take 500 mg by mouth daily.   ? aspirin EC 81 mg tablet Take 1 Tab by mouth daily.   ? atorvastatin (LIPITOR) 10 mg tablet TAKE 1 TABLET BY MOUTH DAILY (Patient taking differently: Take 10 mg by mouth daily.)   ? benzonatate (TESSALON PERLES) 100 mg capsule Take one capsule by mouth three times daily as needed for Cough.   ? cetirizine (ZYRTEC) 10 mg tablet Take 10 mg by mouth daily.   ? cholecalciferol (VITAMIN D-3) 1,000 units tablet Take 1,000 Units by mouth daily.   ? Folic Acid 800 mcg tab Take 1 tablet by mouth daily.   ? furosemide (LASIX) 20 mg tablet Take one tablet by mouth daily as needed. Hold this medication until you are feeling better from your infection.   ?  gabapentin (NEURONTIN) 100 mg capsule Take one capsule by mouth twice daily. Start with once at night for a week, then increase BID   ? glucosamine HCl/chondroitin su (GLUCOSAMINE-CHONDROITIN PO) Take 1 tablet by mouth daily.   ? guaiFENesin LA (MUCINEX) 600 mg tablet Take one tablet by mouth twice daily.   ? ipratropium bromide (ATROVENT) 0.02 % nebulizer solution Inhale 2.5 mL by mouth into the lungs every 6 hours as needed for Shortness of Breath or Wheezing.   ? lansoprazole DR (PREVACID 24HR) 15 mg capsule Take 15 mg by mouth daily.   ? levothyroxine (SYNTHROID) 88 mcg tablet TAKE 1 TABLET BY MOUTH EVERY DAY IN THE MORNING ? lidocaine (LIDODERM) 5 % topical patch Apply one patch topically to affected area daily. Apply patch for 12 hours, then remove for 12 hours before repeating.   ? losartan (COZAAR) 100 mg tablet TAKE 1 TABLET BY MOUTH TWICE A DAY   ? melatonin 10 mg cap Take 10 mg by mouth daily.   ? ondansetron (ZOFRAN) 4 mg tablet Take one tablet by mouth every 8 hours as needed for Nausea or Vomiting.   ? oxybutynin XL (DITROPAN XL) 10 mg tablet TAKE 1 TABLET BY MOUTH EVERY DAY   ? peg-electrolyte solution (NULYTELY) 420 gram oral solution Take 4,000 mL by mouth as directed. Take as directed per physician's office   ? polycarbophil (FIBERCON) 625 mg tablet Take 625 mg by mouth twice daily.   ? RESVERATROL-QUERCETIN PO Take 500 mg by mouth daily.   ? traZODone (DESYREL) 50 mg tablet Take one tablet by mouth at bedtime as needed.   ? vit C,E-Zn-coppr-lutein-zeaxan (PRESERVISION AREDS-2) 250-200-40-1 mg-unit-mg-mg cap Take 1 capsule by mouth twice daily.   ? zinc sulfate 220 mg (50 mg elemental zinc) capsule Take 220 mg by mouth daily.     Telehealth Patient Reported Vitals     Row Name 03/09/19 1452 03/09/19 1451             BP:  (!) 159/88  ?       Pulse:  107  ?       Weight:  125.6 kg (277 lb)  ?       SpO2:  95 %  ?       Pain Score:  TEN  TEN           There is no height or weight on file to calculate BMI.     Physical Exam  Gen: Throughout interview, patient is holding her head on the right side and appears to be uncomfortable. alert and oriented  Pulm: nonlabored breathing, able to speak in complete sentences but with frequent coughing  Psych: normal affect         Assessment and Plan:  Breyon A. Villarosa was seen today for migraine, cough and ear pain.    Diagnoses and all orders for this visit:    Acute nonintractable headache, unspecified headache type    Cough    Other orders -     Discontinue: albuterol 0.083% (PROVENTIL) 2.5 mg /3 mL (0.083 %) nebulizer solution; Inhale 3 mL solution by nebulizer as directed every 6 hours as needed for Shortness of Breath or Wheezing.  -     gabapentin (NEURONTIN) 100 mg capsule; Take one capsule by mouth twice daily. Start with once at night for a week, then increase BID  -     albuterol 0.083% (PROVENTIL) 2.5 mg /3 mL (0.083 %) nebulizer solution; Inhale 3 mL  solution by nebulizer as directed every 6 hours as needed for Shortness of Breath or Wheezing.          Will start gabapentin at night for a week and then increase to twice daily.  This might help patient's headache which I think is secondary to Covid.  She is to notify me this week if it is improving and can increase the dose if needed.  Would also recommend starting NyQuil over-the-counter for cough and I think this could also improve her ear pain as I think this is secondary to congestion.  Patient has ongoing dyspnea and cough and will continue the nebulizer treatments scheduled.  We will refill the nebulizer solution.                  18 minutes spent on this patient's encounter with counseling and coordination of care taking >50% of the visit.

## 2019-03-12 ENCOUNTER — Encounter: Admit: 2019-03-12 | Discharge: 2019-03-12 | Payer: MEDICARE

## 2019-03-12 MED ORDER — OXYBUTYNIN CHLORIDE 10 MG PO TR24
ORAL_TABLET | Freq: Every day | 3 refills | 30.00000 days | Status: DC
Start: 2019-03-12 — End: 2019-03-24

## 2019-03-12 MED ORDER — ALBUTEROL SULFATE 2.5 MG /3 ML (0.083 %) IN NEBU
2.5 mg | RESPIRATORY_TRACT | 1 refills | Status: DC | PRN
Start: 2019-03-12 — End: 2019-04-28

## 2019-03-12 MED ORDER — IPRATROPIUM BROMIDE 0.02 % IN SOLN
1 | RESPIRATORY_TRACT | 1 refills | 30.00000 days | Status: DC | PRN
Start: 2019-03-12 — End: 2019-04-28

## 2019-03-15 ENCOUNTER — Encounter: Admit: 2019-03-15 | Discharge: 2019-03-15 | Payer: MEDICARE

## 2019-03-16 ENCOUNTER — Encounter: Admit: 2019-03-16 | Discharge: 2019-03-16 | Payer: MEDICARE

## 2019-03-16 DIAGNOSIS — E079 Disorder of thyroid, unspecified: Secondary | ICD-10-CM

## 2019-03-16 DIAGNOSIS — G5603 Carpal tunnel syndrome, bilateral upper limbs: Secondary | ICD-10-CM

## 2019-03-16 DIAGNOSIS — E785 Hyperlipidemia, unspecified: Secondary | ICD-10-CM

## 2019-03-16 DIAGNOSIS — C801 Malignant (primary) neoplasm, unspecified: Secondary | ICD-10-CM

## 2019-03-16 DIAGNOSIS — J189 Pneumonia, unspecified organism: Secondary | ICD-10-CM

## 2019-03-16 DIAGNOSIS — K929 Disease of digestive system, unspecified: Secondary | ICD-10-CM

## 2019-03-16 DIAGNOSIS — F99 Mental disorder, not otherwise specified: Secondary | ICD-10-CM

## 2019-03-16 DIAGNOSIS — M1990 Unspecified osteoarthritis, unspecified site: Secondary | ICD-10-CM

## 2019-03-16 DIAGNOSIS — I10 Essential (primary) hypertension: Secondary | ICD-10-CM

## 2019-03-17 ENCOUNTER — Encounter: Admit: 2019-03-17 | Discharge: 2019-03-17 | Payer: MEDICARE

## 2019-03-24 ENCOUNTER — Encounter

## 2019-03-24 DIAGNOSIS — I10 Essential (primary) hypertension: Secondary | ICD-10-CM

## 2019-03-24 DIAGNOSIS — K929 Disease of digestive system, unspecified: Secondary | ICD-10-CM

## 2019-03-24 DIAGNOSIS — R600 Localized edema: Secondary | ICD-10-CM

## 2019-03-24 DIAGNOSIS — G5603 Carpal tunnel syndrome, bilateral upper limbs: Secondary | ICD-10-CM

## 2019-03-24 DIAGNOSIS — D649 Anemia, unspecified: Secondary | ICD-10-CM

## 2019-03-24 DIAGNOSIS — C801 Malignant (primary) neoplasm, unspecified: Secondary | ICD-10-CM

## 2019-03-24 DIAGNOSIS — E079 Disorder of thyroid, unspecified: Secondary | ICD-10-CM

## 2019-03-24 DIAGNOSIS — R05 Cough: Secondary | ICD-10-CM

## 2019-03-24 DIAGNOSIS — U071 COVID-19 virus infection: Secondary | ICD-10-CM

## 2019-03-24 DIAGNOSIS — M1990 Unspecified osteoarthritis, unspecified site: Secondary | ICD-10-CM

## 2019-03-24 DIAGNOSIS — E785 Hyperlipidemia, unspecified: Secondary | ICD-10-CM

## 2019-03-24 DIAGNOSIS — J189 Pneumonia, unspecified organism: Secondary | ICD-10-CM

## 2019-03-24 DIAGNOSIS — F99 Mental disorder, not otherwise specified: Secondary | ICD-10-CM

## 2019-03-24 NOTE — Progress Notes
Chief Complaint   Patient presents with   ? Cough   ? Wheezing   ? Fatigue   ? Edema     ankles          Subjective:       History of Present Illness  Alicia Fox is a 69 y.o. female.  She presents to clinic with complaints of multiple symptoms that have not resolved since her infection with Covid.  Patient continues to have a significant cough that inhibits her conversations.  She has been able to sleep about 4 to 6 hours a night which is much better than before.  She states she is taking the Delsym and the NyQuil to help with the cough.  Patient also has ongoing fatigue.  She wonders if she should just push through it.  Patient has swelling in her ankles.  She states she wakes up with skinny ankles but by the afternoon she has significant swelling in her ankles and wonders if it is time to take Lasix again.  She feels like she is eating a lot.  Her blood pressure has been up and down.  Overall she just feels tired.  Patient stopped taking oxybutynin about a month ago and has not noticed any change in her symptoms so she is no longer taking this.          Review of Systems   Constitution: Positive for malaise/fatigue. Negative for fever.   HENT: Positive for congestion.    Cardiovascular: Positive for dyspnea on exertion and leg swelling. Negative for chest pain.   Respiratory: Positive for cough, shortness of breath and wheezing.    Musculoskeletal: Positive for muscle weakness.   Gastrointestinal: Negative for abdominal pain.   Genitourinary: Positive for bladder incontinence.   Neurological: Positive for headaches.          Objective:         ? acetaminophen (TYLENOL) 325 mg tablet Take two tablets by mouth every 6 hours as needed. For fever or pain.   ? albuterol 0.083% (PROVENTIL) 2.5 mg /3 mL (0.083 %) nebulizer solution Inhale 3 mL solution by nebulizer as directed every 6 hours as needed for Shortness of Breath or Wheezing. ? amLODIPine (NORVASC) 2.5 mg tablet Take one tablet by mouth twice daily.   ? ascorbic acid (VITAMIN C) 500 mg tablet Take 500 mg by mouth daily.   ? aspirin EC 81 mg tablet Take 1 Tab by mouth daily.   ? atorvastatin (LIPITOR) 10 mg tablet TAKE 1 TABLET BY MOUTH DAILY (Patient taking differently: Take 10 mg by mouth daily.)   ? benzonatate (TESSALON PERLES) 100 mg capsule Take one capsule by mouth three times daily as needed for Cough.   ? cetirizine (ZYRTEC) 10 mg tablet Take 10 mg by mouth daily.   ? cholecalciferol (VITAMIN D-3) 1,000 units tablet Take 1,000 Units by mouth daily.   ? Folic Acid 800 mcg tab Take 1 tablet by mouth daily.   ? furosemide (LASIX) 20 mg tablet Take one tablet by mouth daily as needed. Hold this medication until you are feeling better from your infection.   ? gabapentin (NEURONTIN) 100 mg capsule Take one capsule by mouth twice daily. Start with once at night for a week, then increase BID   ? glucosamine HCl/chondroitin su (GLUCOSAMINE-CHONDROITIN PO) Take 1 tablet by mouth daily.   ? guaiFENesin LA (MUCINEX) 600 mg tablet Take one tablet by mouth twice daily.   ? ipratropium bromide (ATROVENT) 0.02 %  nebulizer solution Inhale 2.5 mL by mouth into the lungs every 6 hours as needed for Shortness of Breath or Wheezing.   ? lansoprazole DR (PREVACID 24HR) 15 mg capsule Take 15 mg by mouth daily.   ? levothyroxine (SYNTHROID) 88 mcg tablet TAKE 1 TABLET BY MOUTH EVERY DAY IN THE MORNING   ? lidocaine (LIDODERM) 5 % topical patch Apply one patch topically to affected area daily. Apply patch for 12 hours, then remove for 12 hours before repeating.   ? losartan (COZAAR) 100 mg tablet TAKE 1 TABLET BY MOUTH TWICE A DAY   ? melatonin 10 mg cap Take 10 mg by mouth daily.   ? ondansetron (ZOFRAN) 4 mg tablet Take one tablet by mouth every 8 hours as needed for Nausea or Vomiting. ? peg-electrolyte solution (NULYTELY) 420 gram oral solution Take 4,000 mL by mouth as directed. Take as directed per physician's office   ? polycarbophil (FIBERCON) 625 mg tablet Take 625 mg by mouth twice daily.   ? RESVERATROL-QUERCETIN PO Take 500 mg by mouth daily.   ? vit C,E-Zn-coppr-lutein-zeaxan (PRESERVISION AREDS-2) 250-200-40-1 mg-unit-mg-mg cap Take 1 capsule by mouth twice daily.   ? zinc sulfate 220 mg (50 mg elemental zinc) capsule Take 220 mg by mouth daily.     Vitals:    03/24/19 1533   BP: (!) 160/90   BP Source: Arm, Left Upper   Patient Position: Sitting   Pulse: 109   Resp: 20   Temp: 36.8 ?C (98.3 ?F)   TempSrc: Temporal   SpO2: 96%   Weight: 128.4 kg (283 lb)   Height: 160 cm (63)   PainSc: Zero     Body mass index is 50.13 kg/m?Marland Kitchen     Physical Exam  General: Appears fatigued and acutely ill, A& O x3  ENT: Postnasal drainage in oropharynx, TMs clear bilaterally  CV: RRR no murmur  Pulm: Decreased breath sounds bilaterally but improved since last visit with no expiratory wheezes  Ext: no c/c, 1+ pitting edema bilaterally  Psych: Normal mood and affect           Assessment and Plan:  1. Cough  Secondary to Covid infection.  O2 sat is normal today and pulmonary exam has improved.  Continue regular breathing treatments as scheduled and repeat two-view x-ray today.  I think this will take some time to recuperate.  Continue over-the-counter as needed cough medicines  - COMPREHENSIVE METABOLIC PANEL; Future  - CBC; Future  - CHEST 2 VIEWS; Future  - TSH WITH FREE T4 REFLEX; Future    2. Localized edema  Most likely secondary to fluid and steroids in the hospital.  Will order labs to further evaluate as well.  Encourage patient while she is sitting or lying down to elevate her legs.  - COMPREHENSIVE METABOLIC PANEL; Future  - CBC; Future  - CHEST 2 VIEWS; Future  - TSH WITH FREE T4 REFLEX; Future    3. Anemia, unspecified type  Had had a low hemoglobin in the hospital.  Will repeat CBC - CBC; Future    4. COVID-19 virus infection  Patient is still recuperating from this and it could take months.  Discussed with patient to be patient and call if her symptoms worsen.                   Monitored today and my assessment is that the current treatment is appropriate.

## 2019-03-25 ENCOUNTER — Encounter: Admit: 2019-03-25 | Discharge: 2019-03-25 | Payer: MEDICARE

## 2019-03-25 LAB — CBC
Lab: 11 10*3/uL (ref 4.5–11.0)
Lab: 22 pg — ABNORMAL LOW (ref 26–34)
Lab: 32 g/dL (ref 32.0–36.0)
Lab: 5 M/UL — ABNORMAL HIGH (ref 4.0–5.0)
Lab: 8.5 FL (ref 7–11)

## 2019-03-25 LAB — COMPREHENSIVE METABOLIC PANEL
Lab: 0.4 mg/dL (ref 0.3–1.2)
Lab: 0.5 mg/dL — ABNORMAL HIGH (ref 0.4–1.00)
Lab: 10 mg/dL (ref 8.5–10.6)
Lab: 104 MMOL/L — ABNORMAL LOW (ref 98–110)
Lab: 141 MMOL/L — ABNORMAL LOW (ref 137–147)
Lab: 142 U/L — ABNORMAL HIGH (ref 25–110)
Lab: 15 U/L (ref 7–40)
Lab: 19 U/L (ref 7–56)
Lab: 25 MMOL/L (ref 21–30)
Lab: 4 MMOL/L — ABNORMAL LOW (ref 3.5–5.1)
Lab: 4.2 g/dL (ref 3.5–5.0)
Lab: >60 mL/min (ref >60)
Lab: >60 mL/min (ref >60)
Lab: 12 (ref 3–12)

## 2019-03-25 LAB — TSH WITH FREE T4 REFLEX: Lab: 0.9 uU/mL (ref 0.35–5.00)

## 2019-03-29 ENCOUNTER — Encounter: Admit: 2019-03-29 | Discharge: 2019-03-29 | Payer: MEDICARE

## 2019-04-05 ENCOUNTER — Encounter: Admit: 2019-04-05 | Discharge: 2019-04-05 | Payer: MEDICARE

## 2019-04-06 MED ORDER — GABAPENTIN 100 MG PO CAP
100 mg | ORAL_CAPSULE | Freq: Every evening | ORAL | 0 refills | Status: DC
Start: 2019-04-06 — End: 2019-05-05

## 2019-04-09 ENCOUNTER — Encounter: Admit: 2019-04-09 | Discharge: 2019-04-09 | Payer: MEDICARE

## 2019-04-09 NOTE — Telephone Encounter
Informed pt urine culture was negative from 1/28 in [place]

## 2019-04-13 ENCOUNTER — Encounter: Admit: 2019-04-13 | Discharge: 2019-04-13 | Payer: MEDICARE

## 2019-04-19 ENCOUNTER — Encounter: Admit: 2019-04-19 | Discharge: 2019-04-19 | Payer: MEDICARE

## 2019-04-28 ENCOUNTER — Encounter: Admit: 2019-04-28 | Discharge: 2019-04-28 | Payer: MEDICARE

## 2019-04-28 MED ORDER — IPRATROPIUM BROMIDE 0.02 % IN SOLN
Freq: Four times a day (QID) | 1 refills | 25.00000 days | Status: DC | PRN
Start: 2019-04-28 — End: 2019-05-05

## 2019-04-28 MED ORDER — ALBUTEROL SULFATE 2.5 MG /3 ML (0.083 %) IN NEBU
1 refills | Status: DC | PRN
Start: 2019-04-28 — End: 2019-10-01

## 2019-05-04 ENCOUNTER — Encounter: Admit: 2019-05-04 | Discharge: 2019-05-04 | Payer: MEDICARE

## 2019-05-05 ENCOUNTER — Ambulatory Visit: Admit: 2019-05-05 | Discharge: 2019-05-05 | Payer: MEDICARE

## 2019-05-05 ENCOUNTER — Encounter: Admit: 2019-05-05 | Discharge: 2019-05-05 | Payer: MEDICARE

## 2019-05-05 DIAGNOSIS — R05 Cough: Secondary | ICD-10-CM

## 2019-05-05 DIAGNOSIS — F99 Mental disorder, not otherwise specified: Secondary | ICD-10-CM

## 2019-05-05 DIAGNOSIS — R0902 Hypoxemia: Secondary | ICD-10-CM

## 2019-05-05 DIAGNOSIS — U071 COVID-19: Secondary | ICD-10-CM

## 2019-05-05 DIAGNOSIS — R140 Abdominal distension (gaseous): Secondary | ICD-10-CM

## 2019-05-05 DIAGNOSIS — M1990 Unspecified osteoarthritis, unspecified site: Secondary | ICD-10-CM

## 2019-05-05 DIAGNOSIS — I10 Essential (primary) hypertension: Secondary | ICD-10-CM

## 2019-05-05 DIAGNOSIS — E079 Disorder of thyroid, unspecified: Secondary | ICD-10-CM

## 2019-05-05 DIAGNOSIS — J189 Pneumonia, unspecified organism: Secondary | ICD-10-CM

## 2019-05-05 DIAGNOSIS — E785 Hyperlipidemia, unspecified: Secondary | ICD-10-CM

## 2019-05-05 DIAGNOSIS — C801 Malignant (primary) neoplasm, unspecified: Secondary | ICD-10-CM

## 2019-05-05 DIAGNOSIS — K929 Disease of digestive system, unspecified: Secondary | ICD-10-CM

## 2019-05-05 DIAGNOSIS — G5603 Carpal tunnel syndrome, bilateral upper limbs: Secondary | ICD-10-CM

## 2019-05-05 MED ORDER — IPRATROPIUM BROMIDE 0.02 % IN SOLN
1 | Freq: Two times a day (BID) | RESPIRATORY_TRACT | 1 refills | 30.00000 days | Status: DC | PRN
Start: 2019-05-05 — End: 2019-10-01

## 2019-05-05 MED ORDER — GABAPENTIN 100 MG PO CAP
100 mg | ORAL_CAPSULE | Freq: Two times a day (BID) | ORAL | 3 refills | Status: DC
Start: 2019-05-05 — End: 2019-08-18

## 2019-05-05 MED ORDER — ONDANSETRON HCL 4 MG PO TAB
4 mg | ORAL_TABLET | ORAL | 1 refills | 8.00000 days | Status: AC | PRN
Start: 2019-05-05 — End: ?

## 2019-05-05 NOTE — Patient Instructions
To schedule your radiology/imaging, please call [phone]

## 2019-05-05 NOTE — Telephone Encounter
From: Joneen Caraway, MD   Sent: 05/05/2019 12:57 PM CST   To: Shawna Clamp, RN   Subject: CT                          , I would like to change this patient's CT chest to with IV contrast. She is already scheduled for this next week. Can we change it? Thanks.

## 2019-05-05 NOTE — Progress Notes
Chief Complaint   Patient presents with   ? Shortness of Breath   ? Cough   ? Altered mental status     pt says she can't concentrate   ? Gas     belching   ? Follow Up     pt says she feels so weak and tired that she has a hard time doing things around the house   ? Nausea     no vomiting   ? Fever     99.0 last night          Subjective:       History of Present Illness  Alicia Fox is a 69 y.o. female.  Patient presents to clinic with complaints of not feeling well since her Covid infection.  She did get better but then has recently declined.  Patient has been under a lot of stress with the death of her mother and recently had been staying at her house and returned recently.  Patient has difficulty exerting at all and when she checks her oximetry when she walks it goes significantly down below 88.  Patient states she also feels short of breath.  Overall patient just feels fatigued.  She also complains of abdominal bloating and a lot of flatus.  She states that she has not changed any of her eating habits.  She just feels overall uncomfortable.  Patient is concerned just because of the chronicity of her symptoms since COVID-19 and that she got better and now is declining again.  She feels like she is in a fog and cannot really concentrate which has been ongoing since her Covid infection.  Her daughter is here with her and also is concerned about this.          Review of Systems   Constitution: Positive for malaise/fatigue. Negative for fever.   Cardiovascular: Positive for dyspnea on exertion and leg swelling. Negative for chest pain.   Respiratory: Positive for cough and shortness of breath.    Musculoskeletal: Positive for myalgias.   Gastrointestinal: Positive for bloating, flatus and nausea. Negative for vomiting.          Objective:         ? acetaminophen (TYLENOL) 325 mg tablet Take two tablets by mouth every 6 hours as needed. For fever or pain.   ? albuterol 0.083% (PROVENTIL) 2.5 mg /3 mL (0.083 %) nebulizer solution USE 1 VIAL IN NEBULIZER EVEYR 6 HOURS AS NEEDED FOR SHORTNESS OF BREATH/WHEEZING   ? amLODIPine (NORVASC) 2.5 mg tablet Take one tablet by mouth twice daily.   ? ascorbic acid (VITAMIN C) 500 mg tablet Take 500 mg by mouth daily.   ? aspirin EC 81 mg tablet Take 1 Tab by mouth daily.   ? atorvastatin (LIPITOR) 10 mg tablet TAKE 1 TABLET BY MOUTH DAILY (Patient taking differently: Take 10 mg by mouth daily.)   ? cetirizine (ZYRTEC) 10 mg tablet Take 10 mg by mouth daily.   ? cholecalciferol (VITAMIN D-3) 1,000 units tablet Take 1,000 Units by mouth daily.   ? Folic Acid 800 mcg tab Take 1 tablet by mouth daily.   ? gabapentin (NEURONTIN) 100 mg capsule Take one capsule by mouth twice daily.   ? glucosamine HCl/chondroitin su (GLUCOSAMINE-CHONDROITIN PO) Take 1 tablet by mouth daily.   ? guaiFENesin LA (MUCINEX) 600 mg tablet Take one tablet by mouth twice daily.   ? ipratropium bromide (ATROVENT) 0.02 % nebulizer solution INHALE 2.5 ML VIA NEBULIZER EVERY 6  HOURS AS NEEDED FOR SHORTNESS OF BREATH OR WHEEZING.   ? lansoprazole DR (PREVACID 24HR) 15 mg capsule Take 15 mg by mouth daily.   ? levothyroxine (SYNTHROID) 88 mcg tablet TAKE 1 TABLET BY MOUTH EVERY DAY IN THE MORNING   ? losartan (COZAAR) 100 mg tablet TAKE 1 TABLET BY MOUTH TWICE A DAY   ? melatonin 10 mg cap Take 10 mg by mouth daily.   ? ondansetron (ZOFRAN) 4 mg tablet Take one tablet by mouth every 8 hours as needed for Nausea or Vomiting.   ? peg-electrolyte solution (NULYTELY) 420 gram oral solution Take 4,000 mL by mouth as directed. Take as directed per physician's office   ? polycarbophil (FIBERCON) 625 mg tablet Take 625 mg by mouth twice daily.   ? RESVERATROL-QUERCETIN PO Take 500 mg by mouth daily.   ? vit C,E-Zn-coppr-lutein-zeaxan (PRESERVISION AREDS-2) 250-200-40-1 mg-unit-mg-mg cap Take 1 capsule by mouth twice daily.   ? zinc sulfate 220 mg (50 mg elemental zinc) capsule Take 220 mg by mouth daily.     Vitals: 05/05/19 1058   BP: (!) 142/84   BP Source: Arm, Left Upper   Patient Position: Sitting   Pulse: 97   Resp: 22   Temp: 36.8 ?C (98.3 ?F)   TempSrc: Temporal   SpO2: 96%   Weight: 128.8 kg (284 lb)   Height: 160 cm (63)   PainSc: Three     Body mass index is 50.31 kg/m?Marland Kitchen     Physical Exam  General: In mild distress secondary to discomfort and coughs frequently, A& O x3  CV: RRR no murmur  Pulm: Some decreased breath sounds in bases bilaterally with no wheezes.  Able to speak in complete sentences  GI: Soft, NABS  Ext: no c/c, 1+ lower extremity edema bilaterally  Psych: Normal mood and affect    Pulse oximetry at rest 96% with exertion O2 sat decreases to 84%       Assessment and Plan:  1. Exercise hypoxemia  We will order CT chest with contrast to further evaluate as I think this is secondary to COVID-19.  We will also order oxygen for patient to use with exertion.  Continue albuterol and ipratropium.  - AMB REFERRAL TO PULMONARY  - CT CHEST WO CONTRAST; Future    2. COVID-19  Patient with ongoing pulmonary symptoms since infection and Covid pneumonia.  Will order CT chest and refer to Dr. Brown Human and pulmonary  - AMB REFERRAL TO PULMONARY  - CT CHEST WO CONTRAST; Future    3. Cough  Most likely secondary to Covid.  Patient is already on medications and is continuing breathing treatments.  Will obtain imaging to further evaluate  - CHEST 2 VIEWS; Future  - CT CHEST WO CONTRAST; Future    4. Abdominal bloating  Zofran as needed and Gas-X as needed.  We will also order x-ray to further evaluate her check for any constipation or obstruction  - ABD COMPL W DECUB/ERECT VIEWS; Future       Problem   Shortness of Breath With Exposure to Severe Acute Respiratory Syndrome Coronavirus 2 (Sars-Cov-2)   Covid-19   Weight Loss (Resolved)   Weight Gain (Resolved)              Monitored today and my assessment is that the current treatment is appropriate.     I reviewed with the patient their current medications and specifically any new medications prescribed at the time of this visit and we reviewed  the expected benefits and potential side effects. All questions are answered to the patient's satisfaction.

## 2019-05-05 NOTE — Telephone Encounter
Change Chest CT from without contrast to with contrast per dr Joaquim Lai request.  Spoke with Lattie Haw in scheduling.  Appointment changed to 2pm on 3/11.  Lattie Haw will call pt

## 2019-05-09 ENCOUNTER — Encounter: Admit: 2019-05-09 | Discharge: 2019-05-09 | Payer: MEDICARE

## 2019-05-11 ENCOUNTER — Encounter: Admit: 2019-05-11 | Discharge: 2019-05-11 | Payer: MEDICARE

## 2019-05-11 ENCOUNTER — Emergency Department: Admit: 2019-05-11 | Discharge: 2019-05-11 | Payer: MEDICARE

## 2019-05-11 MED ORDER — IOHEXOL 350 MG IODINE/ML IV SOLN
100 mL | Freq: Once | INTRAVENOUS | 0 refills | Status: CP
Start: 2019-05-11 — End: ?
  Administered 2019-05-12: 04:00:00 100 mL via INTRAVENOUS

## 2019-05-11 MED ORDER — FUROSEMIDE 10 MG/ML IJ SOLN
20 mg | Freq: Once | INTRAVENOUS | 0 refills | Status: CP
Start: 2019-05-11 — End: ?
  Administered 2019-05-12: 06:00:00 20 mg via INTRAVENOUS

## 2019-05-11 MED ORDER — ACETAMINOPHEN 500 MG PO TAB
1000 mg | Freq: Once | ORAL | 0 refills | Status: CP
Start: 2019-05-11 — End: ?
  Administered 2019-05-12: 06:00:00 1000 mg via ORAL

## 2019-05-11 MED ORDER — SODIUM CHLORIDE 0.9 % IJ SOLN
50 mL | Freq: Once | INTRAVENOUS | 0 refills | Status: CP
Start: 2019-05-11 — End: ?
  Administered 2019-05-12: 04:00:00 50 mL via INTRAVENOUS

## 2019-05-11 MED ORDER — METOCLOPRAMIDE HCL 10 MG PO TAB
10 mg | Freq: Once | ORAL | 0 refills | Status: CP
Start: 2019-05-11 — End: ?
  Administered 2019-05-12: 06:00:00 10 mg via ORAL

## 2019-05-11 NOTE — Telephone Encounter
Pt's daughter, Claiborne Billings LVM saying that she is very concerned about her mom.  Per Dr. Joaquim Lai, the pt needs to go the hospital.      Claiborne Billings was called back and informed of Dr. Jena Gauss orders.  Claiborne Billings agreed and said that she will be taking her mom to the [place] [facility].

## 2019-05-11 NOTE — Telephone Encounter
Alicia Fox with [employer] called to clarify the  diagnosis for the oxygen ordered.  She said that a chronic respiratory diagnosis needed to be noted as the reason for the oxygen being order or the insurance will not pay.

## 2019-05-11 NOTE — Telephone Encounter
I checked chronic bronchitis on the previous paperwork.

## 2019-05-12 ENCOUNTER — Inpatient Hospital Stay: Admit: 2019-05-12 | Discharge: 2019-05-12 | Payer: MEDICARE

## 2019-05-12 ENCOUNTER — Encounter: Admit: 2019-05-12 | Discharge: 2019-05-12 | Payer: MEDICARE

## 2019-05-12 ENCOUNTER — Emergency Department: Admit: 2019-05-12 | Discharge: 2019-05-11 | Payer: MEDICARE

## 2019-05-12 ENCOUNTER — Inpatient Hospital Stay: Admit: 2019-05-12 | Payer: MEDICARE

## 2019-05-12 LAB — COVID-19 (SARS-COV-2) PCR

## 2019-05-12 LAB — URINALYSIS MICROSCOPIC REFLEX TO CULTURE

## 2019-05-12 LAB — COMPREHENSIVE METABOLIC PANEL
Lab: 0.4 mg/dL — ABNORMAL LOW (ref 0.4–1.00)
Lab: 107 MMOL/L — ABNORMAL LOW (ref 98–110)
Lab: 133 mg/dL — ABNORMAL HIGH (ref 70–100)
Lab: 144 MMOL/L (ref 137–147)
Lab: 15 mg/dL — ABNORMAL LOW (ref 7–25)

## 2019-05-12 LAB — URINALYSIS DIPSTICK REFLEX TO CULTURE
Lab: NEGATIVE 10*3/uL (ref 0–0.45)
Lab: NEGATIVE K/UL (ref 3–12)
Lab: NEGATIVE U/L (ref 7–56)
Lab: NEGATIVE mL/min (ref 0–0.80)
Lab: POSITIVE K/UL — AB (ref 0–0.20)

## 2019-05-12 LAB — MAGNESIUM: Lab: 2 mg/dL (ref 1.6–2.6)

## 2019-05-12 LAB — BNP POC ER: Lab: 30 pg/mL (ref 0–100)

## 2019-05-12 LAB — CBC AND DIFF
Lab: 9.4 10*3/uL (ref 4.5–11.0)
Lab: 19 (ref <20.7)

## 2019-05-12 LAB — POC CREATININE, RAD: Lab: 0.5 mg/dL (ref 0.4–1.00)

## 2019-05-12 LAB — D-DIMER: Lab: 110 ng{FEU}/mL — ABNORMAL HIGH (ref <500)

## 2019-05-12 LAB — POC TROPONIN: Lab: 0 ng/mL (ref 0.00–0.05)

## 2019-05-12 LAB — POC LACTATE: Lab: 0.6 MMOL/L (ref 0.5–2.0)

## 2019-05-12 MED ORDER — ENOXAPARIN 40 MG/0.4 ML SC SYRG
40 mg | Freq: Every day | SUBCUTANEOUS | 0 refills | Status: DC
Start: 2019-05-12 — End: 2019-05-12

## 2019-05-12 MED ORDER — ONDANSETRON HCL (PF) 4 MG/2 ML IJ SOLN
4 mg | INTRAVENOUS | 0 refills | Status: DC | PRN
Start: 2019-05-12 — End: 2019-05-16

## 2019-05-12 MED ORDER — AMLODIPINE 5 MG PO TAB
2.5 mg | Freq: Two times a day (BID) | ORAL | 0 refills | Status: DC
Start: 2019-05-12 — End: 2019-05-16
  Administered 2019-05-12 – 2019-05-16 (×9): 2.5 mg via ORAL

## 2019-05-12 MED ORDER — CETIRIZINE 10 MG PO TAB
10 mg | Freq: Every day | ORAL | 0 refills | Status: DC
Start: 2019-05-12 — End: 2019-05-16
  Administered 2019-05-12 – 2019-05-16 (×5): 10 mg via ORAL

## 2019-05-12 MED ORDER — LOSARTAN 50 MG PO TAB
100 mg | Freq: Two times a day (BID) | ORAL | 0 refills | Status: DC
Start: 2019-05-12 — End: 2019-05-16
  Administered 2019-05-12 – 2019-05-16 (×9): 100 mg via ORAL

## 2019-05-12 MED ORDER — DIPHENHYDRAMINE HCL 25 MG PO CAP
25 mg | ORAL | 0 refills | Status: DC | PRN
Start: 2019-05-12 — End: 2019-05-16

## 2019-05-12 MED ORDER — ACETAMINOPHEN 325 MG PO TAB
650 mg | ORAL | 0 refills | Status: DC | PRN
Start: 2019-05-12 — End: 2019-05-16
  Administered 2019-05-12 – 2019-05-14 (×4): 650 mg via ORAL

## 2019-05-12 MED ORDER — ALUMINUM-MAGNESIUM HYDROXIDE 200-200 MG/5 ML PO SUSP
30 mL | ORAL | 0 refills | Status: DC | PRN
Start: 2019-05-12 — End: 2019-05-16
  Administered 2019-05-13 – 2019-05-15 (×9): 30 mL via ORAL

## 2019-05-12 MED ORDER — BENZONATATE 100 MG PO CAP
100 mg | Freq: Three times a day (TID) | ORAL | 0 refills | Status: DC | PRN
Start: 2019-05-12 — End: 2019-05-16
  Administered 2019-05-16: 03:00:00 100 mg via ORAL

## 2019-05-12 MED ORDER — PNEUMOCOCCAL 23-VAL PS VACCINE 25 MCG/0.5 ML IJ SOLN
.5 mL | Freq: Once | INTRAMUSCULAR | 0 refills | Status: CP
Start: 2019-05-12 — End: ?
  Administered 2019-05-14: 20:00:00 0.5 mL via INTRAMUSCULAR

## 2019-05-12 MED ORDER — ASPIRIN 81 MG PO TBEC
81 mg | Freq: Every day | ORAL | 0 refills | Status: DC
Start: 2019-05-12 — End: 2019-05-15
  Administered 2019-05-12 – 2019-05-14 (×3): 81 mg via ORAL

## 2019-05-12 MED ORDER — ALBUTEROL SULFATE 2.5 MG/0.5 ML IN NEBU
2.5 mg | Freq: Two times a day (BID) | RESPIRATORY_TRACT | 0 refills | Status: DC | PRN
Start: 2019-05-12 — End: 2019-05-16
  Administered 2019-05-12 – 2019-05-16 (×9): 2.5 mg via RESPIRATORY_TRACT

## 2019-05-12 MED ORDER — IPRATROPIUM BROMIDE 0.02 % IN SOLN
.5 mg | Freq: Two times a day (BID) | RESPIRATORY_TRACT | 0 refills | Status: DC | PRN
Start: 2019-05-12 — End: 2019-05-16
  Administered 2019-05-12 – 2019-05-16 (×9): 0.5 mg via RESPIRATORY_TRACT

## 2019-05-12 MED ORDER — DIPHENHYDRAMINE HCL 50 MG/ML IJ SOLN
25 mg | INTRAVENOUS | 0 refills | Status: DC | PRN
Start: 2019-05-12 — End: 2019-05-16

## 2019-05-12 MED ORDER — PNEUMOCOCCAL 23-VAL PS VACCINE 25 MCG/0.5 ML IJ SOLN
.5 mL | Freq: Once | INTRAMUSCULAR | 0 refills | Status: DC
Start: 2019-05-12 — End: 2019-05-13

## 2019-05-12 MED ORDER — ENOXAPARIN 60 MG/0.6 ML SC SYRG
60 mg | Freq: Two times a day (BID) | SUBCUTANEOUS | 0 refills | Status: DC
Start: 2019-05-12 — End: 2019-05-12

## 2019-05-12 MED ORDER — IPRATROPIUM BROMIDE 0.02 % IN SOLN
.5 mg | RESPIRATORY_TRACT | 0 refills | Status: DC | PRN
Start: 2019-05-12 — End: 2019-05-12

## 2019-05-12 MED ORDER — GABAPENTIN 100 MG PO CAP
100 mg | Freq: Two times a day (BID) | ORAL | 0 refills | Status: DC
Start: 2019-05-12 — End: 2019-05-16
  Administered 2019-05-12 – 2019-05-16 (×9): 100 mg via ORAL

## 2019-05-12 MED ORDER — LABETALOL 5 MG/ML IV SYRG
10 mg | INTRAVENOUS | 0 refills | Status: DC | PRN
Start: 2019-05-12 — End: 2019-05-16
  Administered 2019-05-12: 22:00:00 10 mg via INTRAVENOUS

## 2019-05-12 MED ORDER — LEVOTHYROXINE 88 MCG PO TAB
88 ug | Freq: Every morning | ORAL | 0 refills | Status: DC
Start: 2019-05-12 — End: 2019-05-16
  Administered 2019-05-12 – 2019-05-16 (×5): 88 ug via ORAL

## 2019-05-12 MED ORDER — GUAIFENESIN 100 MG/5 ML PO LIQD
200 mg | ORAL | 0 refills | Status: DC | PRN
Start: 2019-05-12 — End: 2019-05-16
  Administered 2019-05-13 – 2019-05-16 (×11): 200 mg via ORAL

## 2019-05-12 MED ORDER — CHOLECALCIFEROL (VITAMIN D3) 25 MCG (1,000 UNIT) PO TAB
1000 [IU] | Freq: Every day | ORAL | 0 refills | Status: DC
Start: 2019-05-12 — End: 2019-05-16
  Administered 2019-05-12 – 2019-05-16 (×5): 1000 [IU] via ORAL

## 2019-05-12 MED ORDER — ACETAMINOPHEN 500 MG PO TAB
500 mg | ORAL | 0 refills | Status: DC | PRN
Start: 2019-05-12 — End: 2019-05-12

## 2019-05-12 MED ORDER — PERFLUTREN LIPID MICROSPHERES 1.1 MG/ML IV SUSP
1-20 mL | Freq: Once | INTRAVENOUS | 0 refills | Status: CP | PRN
Start: 2019-05-12 — End: ?
  Administered 2019-05-12: 13:00:00 2 mL via INTRAVENOUS

## 2019-05-12 MED ORDER — ALBUTEROL SULFATE 2.5 MG /3 ML (0.083 %) IN NEBU
2.5 mg | RESPIRATORY_TRACT | 0 refills | Status: DC | PRN
Start: 2019-05-12 — End: 2019-05-12

## 2019-05-12 MED ORDER — PANTOPRAZOLE 40 MG PO TBEC
40 mg | Freq: Two times a day (BID) | ORAL | 0 refills | Status: DC
Start: 2019-05-12 — End: 2019-05-16
  Administered 2019-05-12 – 2019-05-16 (×9): 40 mg via ORAL

## 2019-05-12 MED ORDER — ATORVASTATIN 10 MG PO TAB
10 mg | Freq: Every evening | ORAL | 0 refills | Status: DC
Start: 2019-05-12 — End: 2019-05-16
  Administered 2019-05-13 – 2019-05-16 (×4): 10 mg via ORAL

## 2019-05-12 NOTE — ED Notes
Patient presents with worsening SOA over the last two weeks. Patient had COVID-19 in December and has never gone back to normal. 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 Patient was suppose to be set up with home oxygen through her PCP, but it was denied by her insurance. Patient has seen her oxygen level at home as low as 78%. Patient placed on 2L due to SOA and desat to 85% while talking. Patient's oxygen up to 98%. Dr. Carolyne Fiscal at bedside. Call light given to patient and bed is low and locked.     Belongings: Shirt, pants, jacket, socks, shoe, phone, purse

## 2019-05-12 NOTE — ED Notes
Patient

## 2019-05-13 ENCOUNTER — Inpatient Hospital Stay: Admit: 2019-05-13 | Discharge: 2019-05-13 | Payer: MEDICARE

## 2019-05-13 ENCOUNTER — Encounter: Admit: 2019-05-13 | Discharge: 2019-05-13 | Payer: MEDICARE

## 2019-05-13 MED ORDER — PERFLUTREN LIPID MICROSPHERES 1.1 MG/ML IV SUSP
1-20 mL | Freq: Once | INTRAVENOUS | 0 refills | Status: CP | PRN
Start: 2019-05-13 — End: ?
  Administered 2019-05-13: 17:00:00 1 mL via INTRAVENOUS

## 2019-05-13 MED ORDER — COLCHICINE 0.6 MG PO TAB
0.6 mg | Freq: Two times a day (BID) | ORAL | 0 refills | Status: DC
Start: 2019-05-13 — End: 2019-05-16
  Administered 2019-05-13 – 2019-05-16 (×7): 0.6 mg via ORAL

## 2019-05-13 MED ORDER — IBUPROFEN 600 MG PO TAB
600 mg | ORAL | 0 refills | Status: DC
Start: 2019-05-13 — End: 2019-05-16
  Administered 2019-05-13 – 2019-05-16 (×10): 600 mg via ORAL

## 2019-05-13 MED ORDER — FUROSEMIDE 40 MG PO TAB
20 mg | Freq: Every day | ORAL | 0 refills | Status: DC
Start: 2019-05-13 — End: 2019-05-15
  Administered 2019-05-13 – 2019-05-15 (×3): 20 mg via ORAL

## 2019-05-13 MED ORDER — SPIRONOLACTONE 25 MG PO TAB
25 mg | Freq: Every day | ORAL | 0 refills | Status: DC
Start: 2019-05-13 — End: 2019-05-15
  Administered 2019-05-13 – 2019-05-15 (×3): 25 mg via ORAL

## 2019-05-14 MED ORDER — POTASSIUM CHLORIDE 20 MEQ PO TBTQ
40 meq | Freq: Once | ORAL | 0 refills | Status: CP
Start: 2019-05-14 — End: ?
  Administered 2019-05-15: 40 meq via ORAL

## 2019-05-14 MED ORDER — FUROSEMIDE 10 MG/ML IJ SOLN
20 mg | Freq: Once | INTRAVENOUS | 0 refills | Status: CP
Start: 2019-05-14 — End: ?
  Administered 2019-05-15: 20 mg via INTRAVENOUS

## 2019-05-15 MED ORDER — ACETAMINOPHEN/LIDOCAINE/ANTACID DS(#) 1:1:3  PO SUSP
30 mL | Freq: Three times a day (TID) | ORAL | 0 refills | Status: DC
Start: 2019-05-15 — End: 2019-05-16
  Administered 2019-05-15 – 2019-05-16 (×5): 30 mL via ORAL

## 2019-05-15 MED ORDER — POTASSIUM CHLORIDE 20 MEQ PO TBTQ
40 meq | Freq: Once | ORAL | 0 refills | Status: CP
Start: 2019-05-15 — End: ?
  Administered 2019-05-15: 18:00:00 40 meq via ORAL

## 2019-05-15 MED ORDER — FUROSEMIDE 20 MG PO TAB
40 mg | Freq: Every day | ORAL | 0 refills | Status: DC
Start: 2019-05-15 — End: 2019-05-16
  Administered 2019-05-16: 13:00:00 40 mg via ORAL

## 2019-05-15 MED ORDER — SIMETHICONE 80 MG PO CHEW
80 mg | Freq: Four times a day (QID) | ORAL | 0 refills | Status: DC
Start: 2019-05-15 — End: 2019-05-16
  Administered 2019-05-15 – 2019-05-16 (×5): 80 mg via ORAL

## 2019-05-15 MED ORDER — SPIRONOLACTONE 50 MG PO TAB
25 mg | Freq: Two times a day (BID) | ORAL | 0 refills | Status: DC
Start: 2019-05-15 — End: 2019-05-16
  Administered 2019-05-15 – 2019-05-16 (×2): 25 mg via ORAL

## 2019-05-15 MED ORDER — FUROSEMIDE 20 MG PO TAB
20 mg | Freq: Once | ORAL | 0 refills | Status: CP
Start: 2019-05-15 — End: ?
  Administered 2019-05-15: 18:00:00 20 mg via ORAL

## 2019-05-16 ENCOUNTER — Encounter: Admit: 2019-05-16 | Discharge: 2019-05-16 | Payer: MEDICARE

## 2019-05-16 MED ORDER — SPIRONOLACTONE 25 MG PO TAB
25 mg | ORAL_TABLET | Freq: Two times a day (BID) | ORAL | 0 refills | 46.00000 days | Status: DC
Start: 2019-05-16 — End: 2019-05-28
  Filled 2019-05-16: qty 90, 30d supply, fill #1

## 2019-05-16 MED ORDER — PANTOPRAZOLE 40 MG PO TBEC
40 mg | ORAL_TABLET | Freq: Two times a day (BID) | ORAL | 0 refills | 90.00000 days | Status: DC
Start: 2019-05-16 — End: 2019-06-29
  Filled 2019-05-16: qty 60, 30d supply, fill #1

## 2019-05-16 MED ORDER — IBUPROFEN 600 MG PO TAB
600 mg | ORAL_TABLET | Freq: Three times a day (TID) | ORAL | 0 refills | Status: AC
Start: 2019-05-16 — End: ?
  Filled 2019-05-16: qty 42, 14d supply, fill #1

## 2019-05-16 MED ORDER — RXAMB LIDO/ANTACID/APAP 1:3:1 SUSP (COMPOUND)
30 mL | Freq: Three times a day (TID) | ORAL | 0 refills | 28.00000 days | Status: DC
Start: 2019-05-16 — End: 2019-06-07

## 2019-05-16 MED ORDER — FUROSEMIDE 40 MG PO TAB
40 mg | ORAL_TABLET | Freq: Every day | ORAL | 0 refills | 90.00000 days | Status: DC
Start: 2019-05-16 — End: 2019-05-28
  Filled 2019-05-16: qty 30, 30d supply, fill #1

## 2019-05-16 MED ORDER — COLCHICINE 0.6 MG PO TAB
0.6 mg | ORAL_TABLET | Freq: Two times a day (BID) | ORAL | 0 refills | Status: DC
Start: 2019-05-16 — End: 2019-06-22
  Filled 2019-05-16: qty 180, 30d supply, fill #1

## 2019-05-16 MED ORDER — SIMETHICONE 80 MG PO CHEW
80 mg | ORAL_TABLET | Freq: Four times a day (QID) | ORAL | 0 refills | Status: CN
Start: 2019-05-16 — End: ?

## 2019-05-17 ENCOUNTER — Encounter: Admit: 2019-05-17 | Discharge: 2019-05-17 | Payer: MEDICARE

## 2019-05-17 DIAGNOSIS — R0602 Shortness of breath: Secondary | ICD-10-CM

## 2019-05-17 DIAGNOSIS — I313 Pericardial effusion (noninflammatory): Secondary | ICD-10-CM

## 2019-05-17 DIAGNOSIS — I10 Essential (primary) hypertension: Secondary | ICD-10-CM

## 2019-05-17 DIAGNOSIS — U071 COVID-19: Secondary | ICD-10-CM

## 2019-05-17 NOTE — Telephone Encounter
Hospital Discharge Follow Up      Reached Patient:Yes     Admission Information:     Hospital Name: [facility]  Admission Date: 05/11/2019  Discharge Date: 05/16/2019  Admission Diagnosis: Shortness of breath / cough  Discharge Diagnosis: Pericardial effusion  Has there been a discharge within the last 30 days? No  If yes, reason:   Hospital Services: Unplanned  Today's call is 1(business) days post discharge      Discharge Instruction Review   Did patient receive and understand discharge instructions? Yes    Home Health ordered? No                 Agency name/telephone number:    Has Home Health agency contacted patient? No   Caregiver assistance in the home? No   Are there concerns regarding the patient's ADL'S? No  Is patient a fall risk? Yes    Special diet? Yes If yes, type: Cardiac      Medication Reconciliation    Changes to pre-hospital medications? Yes  Were new prescriptions filled?Yes  Meds reviewed and reconciled?Yes  ? acetaminophen (TYLENOL) 325 mg tablet Take two tablets by mouth every 6 hours as needed. For fever or pain.   ? albuterol 0.083% (PROVENTIL) 2.5 mg /3 mL (0.083 %) nebulizer solution USE 1 VIAL IN NEBULIZER EVEYR 6 HOURS AS NEEDED FOR SHORTNESS OF BREATH/WHEEZING   ? amLODIPine (NORVASC) 2.5 mg tablet Take one tablet by mouth twice daily.   ? ascorbic acid (VITAMIN C) 500 mg tablet Take 500 mg by mouth daily.   ? atorvastatin (LIPITOR) 10 mg tablet TAKE 1 TABLET BY MOUTH DAILY (Patient taking differently: at bedtime daily.)   ? cetirizine (ZYRTEC) 10 mg tablet Take 10 mg by mouth daily.   ? cholecalciferol (VITAMIN D-3) 1,000 units tablet Take 1,000 Units by mouth daily.   ? colchicine 0.6 mg tablet Take one tablet by mouth twice daily   ? dextromethorphan/guaiFENesin (ROBITUSSIN-DM) 10/100 mg/5 mL syrp oral syrup Take 10 mL by mouth daily.   ? Doxylam-Pseudoephed-DM-Acetam 6.25-30-15-500 mg/15 mL liqd Take 30 mL by mouth at bedtime daily. *Nyquil*   ? Folic Acid 800 mcg tab Take 1 tablet by mouth daily.   ? furosemide (LASIX) 40 mg tablet Take one tablet by mouth daily.   ? gabapentin (NEURONTIN) 100 mg capsule Take one capsule by mouth twice daily.   ? Gluc-Chon-MSM#1-Vit C-Mang-Bor (GLUCOSAMINE-CHOND-MSM COMPLEX) 375-500-15-0.5 mg tab Take 2 tablets by mouth daily.   ? guaiFENesin LA (MUCINEX) 600 mg tablet Take one tablet by mouth twice daily.   ? ibuprofen (MOTRIN) 600 mg tablet Take one tablet by mouth three times daily for 14 days. Take with food.   ? ipratropium bromide (ATROVENT) 0.02 % nebulizer solution Inhale 2.5 mL by mouth into the lungs twice daily as needed for Shortness of Breath or Wheezing. Indications: bronchospams, chronic cough with covid   ? levothyroxine (SYNTHROID) 88 mcg tablet TAKE 1 TABLET BY MOUTH EVERY DAY IN THE MORNING   ? LIDO/ANTACID/APAP 1:3:1 SUSP (COMPOUND) Take 30 mL by mouth three times daily.   ? losartan (COZAAR) 100 mg tablet TAKE 1 TABLET BY MOUTH TWICE A DAY   ? melatonin 10 mg cap Take 10 mg by mouth daily.   ? ondansetron (ZOFRAN) 4 mg tablet Take one tablet by mouth every 8 hours as needed for Nausea or Vomiting.   ? pantoprazole DR (PROTONIX) 40 mg tablet Take one tablet by mouth twice daily.   ?  polycarbophil (FIBERCON) 625 mg tablet Take 1,250 mg by mouth twice daily.   ? resveratrol-quercetin 100-100 mg tab Take 1 tablet by mouth daily.   ? simethicone (MYLICON) 80 mg chew tablet Chew one tablet by mouth four times daily.   ? spironolactone (ALDACTONE) 25 mg tablet Take one tablet by mouth twice daily. Take with food.   ? vit C,E-Zn-coppr-lutein-zeaxan (PRESERVISION AREDS-2) 250-200-40-1 mg-unit-mg-mg cap Take 1 capsule by mouth twice daily.   ? zinc sulfate 220 mg (50 mg elemental zinc) capsule Take 220 mg by mouth daily.         Understanding Condition   Having any current symptoms? Yes, dizziness and fatigue  Patient understands when to seek additional medical care? Yes   Other instructions provided : Alicia Fox c/o dizziness and fatigue. She says her breathing is not bad, occ Shortness of breath. Reviewed the AVS. She is adherent with her meds.     Alicia Fox is needing financial assistance for her medications. She is a single income person with Covid Longer Hauler needs. She would also like to have a dietary consult.  Referrals places.   All questions answered.     Scheduling Follow-up Appointment   Upcoming appointment date and time and with whom scheduled:   Future Appointments   Date Time Provider Department Center   05/28/2019  3:30 PM [facility] Procedur   06/10/2019  2:00 PM Cox, Benjamine Mola, MS [facility] Exam   09/07/2019 11:45 AM [facility] Radiolog   09/07/2019  1:45 PM Arlyce Harman, MD [facility] CTS   09/28/2019 10:20 AM Velta Addison, MD [facility] IM     PCP appointment scheduled?No, patient declined appointment  PCP primary location: [facility]  Specialist appointment scheduled? Yes, with Oncology 06/10/2019  Both PCP and Specialist appointment scheduled: No  Is assistance with transportation needed?No      Ewing Schlein, RN

## 2019-05-17 NOTE — Telephone Encounter
Spoke with patient regarding Dr. Madaline Brilliant recommendations. Patient verbalized understanding and agreed with plan of care.     Pt has scheduling number to schedule visit and echo. Will have labs drawn by the end of this week.       Leeanne Rio, MD  Vertell Novak, MD; Mamie Nick Cvm Nurse [facility] [employer]            Can you please arrange for cardiology follow-up with echo in couple of weeks. She follows with Dr. Jiles Harold. She can see her or see her NP.   Dr. Jiles Harold Mrs. Keil presented with shortness of breath and cough and lower extremity edema few months after her Covid. She has never recovered well. Her blood pressure was elevated here. She had a early tamponade on her echo. 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 She underwent pericardiocentesis took more than a liter out and kept the pericardial drain for couple of days. We started her on colchicine and ibuprofen, her ESR and CRP are elevated. She will need outpatient ESR CRP if improved can start titrating NSAIDs down. She will be on the colchicine for 3 months. 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 She will need repeat echo in 2 weeks when she get to see you. 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 She responded really well to Lasix 40 mg and spironolactone. I told her she will need a BMP BNP in 4 to 5 days after leaving the hospital to make sure were not overdoing it. Her blood pressure significantly improved. Was really a pleasure taking care of your patient.

## 2019-05-18 ENCOUNTER — Encounter: Admit: 2019-05-18 | Discharge: 2019-05-18 | Payer: MEDICARE

## 2019-05-18 NOTE — Progress Notes
Received referral from Ledell Noss, RN asking for outreach to patient regarding Rx costs.  Phone call to patient. Reports they used her debit card for the colchicine when she left the hospital and didn't realize how expensive it was ($176.74).Shared several local options that are need based and patient agreed to this writer sending her email with contact information. Also sending her an attachment of Good Rx which will reduce script cost somewhat. Patient plans to follow up with her brother as he has some way to get the same medication for $15. She took this writer's phone number and agrees to call as needed.   Email also sent to Retail Pharmacy team for any other suggestions.    Copying information sent in email below to patient:  Here are a couple other options for Prescription Assistance.  [facility], Claiborne Billings, said to call next Monday at 9 a.m. If you get a busy signal keep calling. Thats the way you get an intake appointment to see if you can get some assistance. Their phone number is [phone].    [facility]  [phone] x [phone] for prescription assistance program

## 2019-05-20 ENCOUNTER — Encounter: Admit: 2019-05-20 | Discharge: 2019-05-20 | Payer: MEDICARE

## 2019-05-20 ENCOUNTER — Ambulatory Visit: Admit: 2019-05-20 | Discharge: 2019-05-20 | Payer: MEDICARE

## 2019-05-20 DIAGNOSIS — I313 Pericardial effusion (noninflammatory): Secondary | ICD-10-CM

## 2019-05-20 DIAGNOSIS — I10 Essential (primary) hypertension: Secondary | ICD-10-CM

## 2019-05-20 DIAGNOSIS — R0602 Shortness of breath: Secondary | ICD-10-CM

## 2019-05-20 LAB — BASIC METABOLIC PANEL
Lab: 10 mg/dL — ABNORMAL HIGH (ref 8.5–10.6)
Lab: 138 MMOL/L (ref 137–147)
Lab: 143 mg/dL — ABNORMAL HIGH (ref 70–100)
Lab: 21 mg/dL (ref 7–25)
Lab: 27 MMOL/L (ref 21–30)
Lab: 4.2 MMOL/L (ref 3.5–5.1)
Lab: >60 mL/min (ref >60)
Lab: >60 mL/min (ref >60)

## 2019-05-20 LAB — BNP (B-TYPE NATRIURETIC PEPTI): Lab: 78 pg/mL (ref 0–100)

## 2019-05-20 LAB — C REACTIVE PROTEIN (CRP): Lab: 0.5 mg/dL (ref <1.0)

## 2019-05-20 LAB — SED RATE: Lab: 54 mm/h — ABNORMAL HIGH (ref 0–30)

## 2019-05-20 MED ORDER — ATORVASTATIN 10 MG PO TAB
ORAL_TABLET | Freq: Every day | 4 refills | Status: AC
Start: 2019-05-20 — End: ?

## 2019-05-20 NOTE — Progress Notes
Patient completed a free nutrition consult as part of the [facility] Care Management program.  Follow-up with dietitian is recommended as needed.     Clinical Nutrition Assessment Summary - New Pt    Name: Alicia Fox           MRN: [id number]                 DOB: [birth year]          Age: 69 y.o.  Date of Service: 05/20/2019     Alicia Fox is a 69 y.o. female Referred by: [phone] [facility] Windell Hummingbird, MD    Reason for Nutrition Referral: Pericardial Effusion  Notable health conditions: Covid-19    Obtained patient's verbal consent to treat them and their agreement to [facility] financial policy and NPP via this telehealth visit during the [facility] Emergency    Nutrition Assessment  Estimated body mass index is 46.38 kg/m? as calculated from the following:    Height as of 05/16/19: 162.6 cm (64).    Weight as of 05/16/19: 122.6 kg (270 lb 3.2 oz).  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 As with any other diagnostic tool, BMI should be evaluated in conjunction with other information related to the patient?s health status. (Meeuwsen, 2010)    Nutrition-Related Problem Review:  Patient with recent admission and d/c on cardiac diet  Has been really watching sodium intake well, tracking  Thinks she just spoke with dietitian or nutritionist, but not sure who   -states she is supposed to receive some information in the mail  Feels like her meals are well controlled, but tends to get really hungry in the evenings   -discussed balanced meals and making sure she is getting enough to eat at meals to reduce sweet cravings   -can also set a healthier structured snack in anticipation of evening hunger  Weighing daily - no fluctuations  Does not think she was given fluid restriction  Patient would like to see what other nutritionist sends her before scheduling again   -will also send her additional cardiac diet information in the meantime    Pertinent Meds:   ? acetaminophen (TYLENOL) 325 mg tablet Take two tablets by mouth every 6 hours as needed. For fever or pain.   ? albuterol 0.083% (PROVENTIL) 2.5 mg /3 mL (0.083 %) nebulizer solution USE 1 VIAL IN NEBULIZER EVEYR 6 HOURS AS NEEDED FOR SHORTNESS OF BREATH/WHEEZING   ? amLODIPine (NORVASC) 2.5 mg tablet Take one tablet by mouth twice daily.   ? ascorbic acid (VITAMIN C) 500 mg tablet Take 500 mg by mouth daily.   ? atorvastatin (LIPITOR) 10 mg tablet TAKE 1 TABLET BY MOUTH EVERY DAY   ? cetirizine (ZYRTEC) 10 mg tablet Take 10 mg by mouth daily.   ? cholecalciferol (VITAMIN D-3) 1,000 units tablet Take 1,000 Units by mouth daily.   ? colchicine 0.6 mg tablet Take one tablet by mouth twice daily   ? dextromethorphan/guaiFENesin (ROBITUSSIN-DM) 10/100 mg/5 mL syrp oral syrup Take 10 mL by mouth daily.   ? Doxylam-Pseudoephed-DM-Acetam 6.25-30-15-500 mg/15 mL liqd Take 30 mL by mouth at bedtime daily. *Nyquil*   ? Folic Acid 800 mcg tab Take 1 tablet by mouth daily.   ? furosemide (LASIX) 40 mg tablet Take one tablet by mouth  daily.   ? gabapentin (NEURONTIN) 100 mg capsule Take one capsule by mouth twice daily.   ? Gluc-Chon-MSM#1-Vit C-Mang-Bor (GLUCOSAMINE-CHOND-MSM COMPLEX) 375-500-15-0.5 mg tab Take 2 tablets by mouth daily.   ? guaiFENesin LA (MUCINEX) 600 mg tablet Take one tablet by mouth twice daily.   ? ibuprofen (MOTRIN) 600 mg tablet Take one tablet by mouth three times daily for 14 days. Take with food.   ? ipratropium bromide (ATROVENT) 0.02 % nebulizer solution Inhale 2.5 mL by mouth into the lungs twice daily as needed for Shortness of Breath or Wheezing. Indications: bronchospams, chronic cough with covid   ? levothyroxine (SYNTHROID) 88 mcg tablet TAKE 1 TABLET BY MOUTH EVERY DAY IN THE MORNING   ? LIDO/ANTACID/APAP 1:3:1 SUSP (COMPOUND) Take 30 mL by mouth three times daily.   ? losartan (COZAAR) 100 mg tablet TAKE 1 TABLET BY MOUTH TWICE A DAY   ? melatonin 10 mg cap Take 10 mg by mouth daily.   ? ondansetron (ZOFRAN) 4 mg tablet Take one tablet by mouth every 8 hours as needed for Nausea or Vomiting.   ? pantoprazole DR (PROTONIX) 40 mg tablet Take one tablet by mouth twice daily.   ? polycarbophil (FIBERCON) 625 mg tablet Take 1,250 mg by mouth twice daily.   ? resveratrol-quercetin 100-100 mg tab Take 1 tablet by mouth daily.   ? simethicone (MYLICON) 80 mg chew tablet Chew one tablet by mouth four times daily.   ? spironolactone (ALDACTONE) 25 mg tablet Take one tablet by mouth twice daily. Take with food.   ? vit C,E-Zn-coppr-lutein-zeaxan (PRESERVISION AREDS-2) 250-200-40-1 mg-unit-mg-mg cap Take 1 capsule by mouth twice daily.   ? zinc sulfate 220 mg (50 mg elemental zinc) capsule Take 220 mg by mouth daily.     Pertinent Labs:   Lab Results   Component Value Date/Time    HGB 11.3 (L) 05/16/2019 06:14 AM    HCT 35.8 (L) 05/16/2019 06:14 AM    IRON 95 02/18/2019 06:30 AM    TIBC 191 (L) 02/18/2019 06:30 AM    FERRITIN 158 02/18/2019 06:30 AM    GLU 142 (H) 05/16/2019 06:14 AM    CR 0.57 05/16/2019 06:14 AM    GFR >60 05/16/2019 06:14 AM    GFRAA >60 05/16/2019 06:14 AM    BUN 14 05/16/2019 06:14 AM    CHOL 158 02/23/2018    TRIG 87 02/23/2018    LDL 94 02/23/2018    HDL 53 02/23/2018    K 4.1 05/16/2019 06:14 AM    NA 141 05/16/2019 06:14 AM    CA 10.0 05/16/2019 06:14 AM       Nutrition Diagnosis  Excessive nutrient intake (sodium) related to food and nutrition-related knowledge deficit as evidenced by diet history.      Intervention/Education  Alicia Fox is here for nutrition education  Educated by: Telephone encounter    Nutrition Prescription: Cardiac diet    Topics Discussed: Meal Planning and Plate Method  Handouts Given: Cardiac Diet    Patient Goals:   -Cardiovascular Health   -No trans fat   -Decrease saturated fat, replace with unsaturated fat   -Reduce sodium, increase potassium/magnesium   -Increase soluble fiber   -Increase omega-3 fatty acids   -Moderate carbohydrate intake   -Read food labels, including ingredient lists   -Limit alcohol, increase exercise    Learner's response: Patient expressed understanding of what was explained to them    Motivational interviewing was used in this session  and patient was allowed to freely ask questions throughout visit.      Monitoring/Evaluation  Follow-up: as needed, provided contact information

## 2019-05-20 NOTE — Progress Notes
Medication Assistance Program packet has been sent to patient for signatures and other required documents (if applicable) . Once received back, medication assistance coordinator will begin processing     Alicia Fox  Medication Assistance Coordinator   [phone]

## 2019-05-21 ENCOUNTER — Encounter: Admit: 2019-05-21 | Discharge: 2019-05-21 | Payer: MEDICARE

## 2019-05-21 NOTE — Telephone Encounter
Phone call to patient sharing that the Medication Assistance Program has mailed out a packet and encouraged her to complete and send back. Patient reports her Deductible was also wrapped in the cost of the colchicine. Agreed to submit the packet as aware that while it is expected she'll only be on the Rx for three months, there is still a lot of uncertainty. Reports she is feeling a little better. Thanked Armed forces training and education officer for the follow up.

## 2019-05-24 ENCOUNTER — Encounter: Admit: 2019-05-24 | Discharge: 2019-05-24 | Payer: MEDICARE

## 2019-05-24 NOTE — Telephone Encounter
-----   Message -----       From:Chandria A Burningham       Sent:05/24/2019 11:12 AM CDT         CJ:[id number] Vanessa Kick, MD    Subject:Test Results Question    I'm scheduled for my first covid-19 tomorrow, Tuesday morning. I was looking over results from blood test. SED rate and RDW values are high. Is this a concern? PLUS every day I get severe headaches. I take my blood pressure and it has a dropped from a high of 141 / 84 to 96/63, 140/86 to 104/68.   This morning within 1/2 hr, the opposite.  113/67 to 139/85.  I'm concerned about BP, blood SED and RDW values.    Also,  a persistent pain between my shoulder blades for over 2 weeks that makes sitting on chairs impossible. I didn't know if that pointed to any other internal problems?  Do I take the covid-19 shot? Or postpone it for a week until after Echo and your appointment on Friday?  Alicia Fox (1950-07-27)  [phone]

## 2019-05-24 NOTE — Telephone Encounter
05/24/2019 1:46 PM   Called patient to discuss the questions sent through My Chart. Patient was reassured that her lab and blood pressure would not be a reason the postpone her first COVID-19 immunization. She states the pain between her should blades is reproduced when the area is touched or when she sits down and her back rests on the chair. She thinks someone told her is was from gallstones. It sound like the pain could be muscular/skeletal. She has also sent a message to her PCP. She also complains of fatigue.. I encouraged her to get her shot tomorrow unless she has new COVID-19 symptoms.   Wilder Glade, LPN

## 2019-05-26 ENCOUNTER — Encounter: Admit: 2019-05-26 | Discharge: 2019-05-26 | Payer: MEDICARE

## 2019-05-28 ENCOUNTER — Encounter: Admit: 2019-05-28 | Discharge: 2019-05-28 | Payer: MEDICARE

## 2019-05-28 ENCOUNTER — Ambulatory Visit: Admit: 2019-05-28 | Discharge: 2019-05-28 | Payer: MEDICARE

## 2019-05-28 DIAGNOSIS — I313 Pericardial effusion (noninflammatory): Secondary | ICD-10-CM

## 2019-05-28 DIAGNOSIS — R600 Localized edema: Secondary | ICD-10-CM

## 2019-05-28 DIAGNOSIS — G4489 Other headache syndrome: Secondary | ICD-10-CM

## 2019-05-28 DIAGNOSIS — I10 Essential (primary) hypertension: Secondary | ICD-10-CM

## 2019-05-28 DIAGNOSIS — R0602 Shortness of breath: Secondary | ICD-10-CM

## 2019-05-28 DIAGNOSIS — R0600 Dyspnea, unspecified: Secondary | ICD-10-CM

## 2019-05-28 DIAGNOSIS — M1990 Unspecified osteoarthritis, unspecified site: Secondary | ICD-10-CM

## 2019-05-28 DIAGNOSIS — K929 Disease of digestive system, unspecified: Secondary | ICD-10-CM

## 2019-05-28 DIAGNOSIS — Z8616 History of COVID-19: Secondary | ICD-10-CM

## 2019-05-28 DIAGNOSIS — E785 Hyperlipidemia, unspecified: Secondary | ICD-10-CM

## 2019-05-28 DIAGNOSIS — G4733 Obstructive sleep apnea (adult) (pediatric): Secondary | ICD-10-CM

## 2019-05-28 DIAGNOSIS — E782 Mixed hyperlipidemia: Secondary | ICD-10-CM

## 2019-05-28 DIAGNOSIS — T887XXA Unspecified adverse effect of drug or medicament, initial encounter: Secondary | ICD-10-CM

## 2019-05-28 DIAGNOSIS — E079 Disorder of thyroid, unspecified: Secondary | ICD-10-CM

## 2019-05-28 DIAGNOSIS — F99 Mental disorder, not otherwise specified: Secondary | ICD-10-CM

## 2019-05-28 DIAGNOSIS — C801 Malignant (primary) neoplasm, unspecified: Secondary | ICD-10-CM

## 2019-05-28 DIAGNOSIS — Z23 Encounter for immunization: Secondary | ICD-10-CM

## 2019-05-28 DIAGNOSIS — U071 COVID-19: Secondary | ICD-10-CM

## 2019-05-28 DIAGNOSIS — J189 Pneumonia, unspecified organism: Secondary | ICD-10-CM

## 2019-05-28 DIAGNOSIS — Z9229 Personal history of other drug therapy: Secondary | ICD-10-CM

## 2019-05-28 DIAGNOSIS — I5032 Chronic diastolic (congestive) heart failure: Secondary | ICD-10-CM

## 2019-05-28 DIAGNOSIS — G5603 Carpal tunnel syndrome, bilateral upper limbs: Secondary | ICD-10-CM

## 2019-05-28 MED ORDER — LOSARTAN 100 MG PO TAB
50 mg | ORAL_TABLET | Freq: Two times a day (BID) | ORAL | 2 refills | 30.00000 days | Status: DC
Start: 2019-05-28 — End: 2019-06-09

## 2019-05-28 MED ORDER — PERFLUTREN LIPID MICROSPHERES 1.1 MG/ML IV SUSP
1-20 mL | Freq: Once | INTRAVENOUS | 0 refills | Status: CP | PRN
Start: 2019-05-28 — End: ?

## 2019-05-28 NOTE — Progress Notes
Date of Service: 05/28/2019    Alicia Fox is a 69 y.o. female.       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    Patient was admitted at [facility] between December 14 and February 18, 2019 with symptoms of acute hypoxic respiratory failure due to COVID-19 pneumonia.  The chest x-ray showed patchy opacities in both lung bases.  Patient was treated with dexamethasone and also oxygen 2-3 L by nasal cannula.  She was discharged home in stable condition.    Unfortunately she continued to experience symptoms of profound shortness of breath, patient was readmitted at [facility] between March 9 in May 16, 2019.  This time she was diagnosed with a pericardial effusion, patient did undergo pericardiocentesis with removal of a 1050 cc of fluid.  She was started on furosemide, spironolactone, ibuprofen was continued and she was also started on colchicine 0.6 mg twice daily for 3 months.  The fluid cytology was not significant for malignancy, it is probably due to COVID-19 virus infection.    The follow-up echocardiograms post pericardiocentesis did not demonstrate any accumulation of fluid.    Today patient is here for a follow-up office visit.  She reports having seen because of shortness of breath and headaches.  She has kept track of her blood pressure and heart rate and noticed that every time she experiences a headache her blood pressure was low.  Currently patient is treated with a combination of amlodipine, losartan, furosemide and spironolactone.  She is also working on weight loss, in December 2020 she weighed 276 pounds and today she weighs 259 pounds.      A limited 2D echo Doppler study was performed today, I personally reviewed it, it demonstrated normal left ventricular systolic function, it was no evidence of pericardial effusion.                                            Vitals:    05/28/19 1631 05/28/19 1643   BP: 128/72 124/74   BP Source: Arm, Left Upper Arm, Right Upper   Patient Position: Sitting Sitting   Pulse: 98    SpO2: 97%    Weight: 117.5 kg (259 lb)    Height: 1.6 m (5' 3)    PainSc: Zero      Body mass index is 45.88 kg/m?Marland Kitchen     Past Medical History  Patient Active Problem List    Diagnosis Date Noted   ? History of COVID-19 05/13/2019     Priority: Medium   ? Pericardial effusion 05/11/2019   ? Shortness of breath with exposure to severe acute respiratory syndrome coronavirus 2 (SARS-CoV-2) 04/05/2019   ? Morbid obesity (HCC) 02/18/2019   ? Chest wall pain 02/18/2019   ? Pneumonia due to COVID-19 virus 02/15/2019   ? COVID-19 02/05/2019   ? OSA (obstructive sleep apnea) 02/17/2018     Split night Pap titration dated 10/21/2017 at the [place] sleep lab showed severe obstructive sleep apnea with AHI of 105.6 events per hour successful CPAP titration 12 cmH2O using full facemask elevated frequency of.  Limb movement with index of 30.8 events per hour and nocturnal hypoxemia with oxygen desaturation  nadir of 77% oxygen saturation below 88% for about 99.1 minutes      CPAP compliance for the past 1 month showed 100% usage for more than 4 hours average daily use of 4 hours and 24 minutes CPAP of 12 residual AHI 0.5 events per hour     ? 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 Carpal tunnel syndrome of right wrist 10/22/2016   ? Carpal tunnel syndrome of left wrist 10/09/2016     Added automatically from request for surgery [id number]     ? 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 Hypercalcemia 08/09/2016   ? Carpal tunnel syndrome, bilateral 08/06/2016   ? Abnormal CT of the head 08/06/2016   ? Cough 08/09/2014   ? Framingham cardiac risk <10% in next 10 years 08/09/2014   ? Medication side effects 05/18/2014   ? Bilateral edema of lower extremity 04/28/2014   ? Dyspnea on exertion 04/28/2014   ? Diastolic heart failure (HCC) 04/28/2014   ? Hyperlipemia 01/18/2010   ? Hypertension 07/21/2008   ? 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 Obesity 07/21/2008         Review of Systems   Constitution: Positive for chills and malaise/fatigue.   HENT: Negative.    Eyes: Positive for photophobia.   Cardiovascular: Positive for dyspnea on exertion, irregular heartbeat and leg swelling.   Endocrine: Positive for polyphagia.   Hematologic/Lymphatic: Negative.    Skin: Positive for suspicious lesions.   Musculoskeletal: Positive for back pain.   Gastrointestinal: Positive for change in bowel habit, diarrhea, excessive appetite, flatus and heartburn.   Genitourinary: Negative.    Neurological: Positive for dizziness, headaches, light-headedness and weakness.   Psychiatric/Behavioral: Negative.    Allergic/Immunologic: Negative.        Physical Exam  General Appearance: obese  Skin: warm, moist, no ulcers or xanthomas  Eyes: conjunctivae and lids normal, pupils are equal and round  Lips & Oral Mucosa: no pallor or cyanosis  Neck Veins: neck veins are flat, neck veins are not distended  Chest Inspection: chest is normal in appearance  Respiratory Effort: breathing comfortably, no respiratory distress  Auscultation/Percussion: lungs clear to auscultation, no rales or rhonchi, no wheezing  Cardiac Rhythm: regular rhythm and normal rate  Cardiac Auscultation: S1, S2 normal, no rub, no gallop  Murmurs: no murmur  Carotid Arteries: normal carotid upstroke bilaterally, no bruit  Abdominal aorta: could not be examined due to obese adomen  Lower Extremity Edema: no lower extremity edema  Abdominal Exam: soft, non-tender, no masses, bowel sounds normal  Liver & Spleen: no organomegaly  Language and Memory: patient responsive and seems to comprehend information  Neurologic Exam: neurological assessment grossly intact        Cardiovascular Studies  Twelve-lead EKG demonstrated normal sinus rhythm, nonspecific ST segment T wave changes    Problems Addressed Today  Encounter Diagnoses   Name Primary?   ? Essential hypertension Yes   ? Mixed hyperlipidemia    ? Chronic diastolic heart failure (HCC)    ? Pericardial effusion    ? Hypercalcemia    ? History of COVID-19    ? Fen-phen history    ? Class 3 severe obesity due to excess calories without serious comorbidity with body mass index (BMI) of 40.0 to 44.9 in adult [facility])    ? Bilateral edema of lower extremity    ? Dyspnea on exertion    ? Medication side effects    ?  OSA (obstructive sleep apnea)    ? Pneumonia due to COVID-19 virus    ? Morbid obesity (HCC)    ? Shortness of breath with exposure to severe acute respiratory syndrome coronavirus 2 (SARS-CoV-2)    ? COVID-19    ? Other headache syndrome    ? COVID-19 vaccine administered        Assessment and Plan     In summary: This is a 69 year old white female with a history of hypertension, hyperlipidemia, morbid obesity, no documented history of CAD, patient was admitted in December 2020 with symptoms compatible with COVID-19 pneumonia, she continued to remain dyspneic and developed acute hypoxic respiratory failure, on May 11, 2019 she was admitted at [facility], patient was found to have a sizable pericardial effusion with tamponade physiology, she did undergo pericardiocentesis on 05/12/2019 with removal of 1050 cc of fluid, the cytology was negative for malignancy.    Patient has been treated with NSAIDs and colchicine.    She has been experiencing headaches, her home records indicated that this correlated with hypotension.    Plan:    1.  Discontinue furosemide and discontinue spironolactone  2.  Continue to take amlodipine 2.5 mg twice daily, take losartan 50 mg p.o. twice daily (patient has a prescription for 100 mg and I asked her to split this tablet in half  3.  I did recommend weight reduction and low-salt diet  4.  2D echo Doppler limited study and office visit with me in approximately 2 months.         Current Medications (including today's revisions)  ? acetaminophen (TYLENOL) 325 mg tablet Take two tablets by mouth every 6 hours as needed. For fever or pain.   ? albuterol 0.083% (PROVENTIL) 2.5 mg /3 mL (0.083 %) nebulizer solution USE 1 VIAL IN NEBULIZER EVEYR 6 HOURS AS NEEDED FOR SHORTNESS OF BREATH/WHEEZING   ? amLODIPine (NORVASC) 2.5 mg tablet Take one tablet by mouth twice daily.   ? ascorbic acid (VITAMIN C) 500 mg tablet Take 500 mg by mouth daily.   ? atorvastatin (LIPITOR) 10 mg tablet TAKE 1 TABLET BY MOUTH EVERY DAY   ? cetirizine (ZYRTEC) 10 mg tablet Take 10 mg by mouth daily.   ? cholecalciferol (VITAMIN D-3) 1,000 units tablet Take 1,000 Units by mouth daily.   ? colchicine 0.6 mg tablet Take one tablet by mouth twice daily   ? dextromethorphan/guaiFENesin (ROBITUSSIN-DM) 10/100 mg/5 mL syrp oral syrup Take 10 mL by mouth daily.   ? Folic Acid 800 mcg tab Take 1 tablet by mouth daily.   ? furosemide (LASIX) 40 mg tablet Take one tablet by mouth daily.   ? gabapentin (NEURONTIN) 100 mg capsule Take one capsule by mouth twice daily.   ? Gluc-Chon-MSM#1-Vit C-Mang-Bor (GLUCOSAMINE-CHOND-MSM COMPLEX) 375-500-15-0.5 mg tab Take 2 tablets by mouth daily.   ? guaiFENesin LA (MUCINEX) 600 mg tablet Take one tablet by mouth twice daily.   ? ibuprofen (MOTRIN) 600 mg tablet Take one tablet by mouth three times daily for 14 days. Take with food.   ? ipratropium bromide (ATROVENT) 0.02 % nebulizer solution Inhale 2.5 mL by mouth into the lungs twice daily as needed for Shortness of Breath or Wheezing. Indications: bronchospams, chronic cough with covid   ? levothyroxine (SYNTHROID) 88 mcg tablet TAKE 1 TABLET BY MOUTH EVERY DAY IN THE MORNING   ? LIDO/ANTACID/APAP 1:3:1 SUSP (COMPOUND) Take 30 mL by mouth three times daily.   ? losartan (COZAAR) 100 mg tablet TAKE  1 TABLET BY MOUTH TWICE A DAY   ? melatonin 10 mg cap Take 10 mg by mouth daily.   ? ondansetron (ZOFRAN) 4 mg tablet Take one tablet by mouth every 8 hours as needed for Nausea or Vomiting.   ? pantoprazole DR (PROTONIX) 40 mg tablet Take one tablet by mouth twice daily.   ? polycarbophil (FIBERCON) 625 mg tablet Take 1,250 mg by mouth twice daily.   ? resveratrol-quercetin 100-100 mg tab Take 1 tablet by mouth daily.   ? simethicone (MYLICON) 80 mg chew tablet Chew one tablet by mouth four times daily.   ? spironolactone (ALDACTONE) 25 mg tablet Take one tablet by mouth twice daily. Take with food.   ? vit C,E-Zn-coppr-lutein-zeaxan (PRESERVISION AREDS-2) 250-200-40-1 mg-unit-mg-mg cap Take 1 capsule by mouth twice daily.   ? zinc sulfate 220 mg (50 mg elemental zinc) capsule Take 220 mg by mouth daily.

## 2019-06-01 ENCOUNTER — Encounter: Admit: 2019-06-01 | Discharge: 2019-06-01 | Payer: MEDICARE

## 2019-06-01 MED ORDER — RXAMB LIDO/ANTACID/APAP 1:3:1 SUSP (COMPOUND)
30 mL | Freq: Three times a day (TID) | ORAL | 0 refills
Start: 2019-06-01 — End: ?

## 2019-06-03 ENCOUNTER — Encounter: Admit: 2019-06-03 | Discharge: 2019-06-03 | Payer: MEDICARE

## 2019-06-07 ENCOUNTER — Encounter: Admit: 2019-06-07 | Discharge: 2019-06-07 | Payer: MEDICARE

## 2019-06-07 MED ORDER — RXAMB LIDO/ANTACID/APAP 1:3:1 SUSP (COMPOUND)
30 mL | Freq: Three times a day (TID) | ORAL | 1 refills | 28.00000 days | Status: DC
Start: 2019-06-07 — End: 2019-06-07

## 2019-06-07 MED ORDER — RXAMB LIDO/ANTACID/APAP 1:3:1 SUSP (COMPOUND)
30 mL | Freq: Three times a day (TID) | ORAL | 0 refills
Start: 2019-06-07 — End: ?

## 2019-06-07 MED ORDER — RXAMB LIDO/ANTACID/APAP 1:3:1 SUSP (COMPOUND)
30 mL | Freq: Three times a day (TID) | ORAL | 1 refills | 28.00000 days | Status: DC
Start: 2019-06-07 — End: 2019-07-15

## 2019-06-07 NOTE — Telephone Encounter
Transmission failed sending the script to [facility]. Called and gave verbal rx

## 2019-06-08 ENCOUNTER — Encounter: Admit: 2019-06-08 | Discharge: 2019-06-08 | Payer: MEDICARE

## 2019-06-09 ENCOUNTER — Encounter: Admit: 2019-06-09 | Discharge: 2019-06-09 | Payer: MEDICARE

## 2019-06-09 MED ORDER — LOSARTAN 50 MG PO TAB
50 mg | ORAL_TABLET | Freq: Two times a day (BID) | ORAL | 3 refills | 30.00000 days | Status: AC
Start: 2019-06-09 — End: ?

## 2019-06-09 MED ORDER — LEVOTHYROXINE 88 MCG PO TAB
ORAL_TABLET | Freq: Every day | ORAL | 1 refills | 30.00000 days | Status: AC
Start: 2019-06-09 — End: ?

## 2019-06-10 ENCOUNTER — Encounter: Admit: 2019-06-10 | Discharge: 2019-06-10 | Payer: MEDICARE

## 2019-06-10 NOTE — Progress Notes
[facility]   Genetic Counseling  Date: 06/10/2019  PRESLEE REGAS  Peck# [id number]  DOB 06-Jun-1950        [name], age 69 y.o., was seen in telehealth clinic today for a discussion of the results of the genetic testing that was performed during our previous visit.     Discussion: During our last visit we ordered the [facility] Panel: (23 genes: ATM, BARD1, BRCA1, BRCA2, BRIP1, CDH1, CHEK2, DICER1, EPCAM, MLH1, MSH2, MSH6, NBN, NF1, PALB2, PMS2, PTEN, RAD51C, RAD51D, RECQL, SMARCA4, STK11 and TP53). We added on the FAM175A and RINT1 genes. Rishita's testing was NEGATIVE, which means she was not found to have a mutation in any of these genes. Testing was ordered for Exa based on her personal history of early onset breast cancer.     Samra's testing did not find a mutation in any of the known genes associated with hereditary breast cancer syndromes. This means that at this time we cannot explain the cause for Khyler's early onset cancer. Rihanna's sisters and daughter need to continue to have regular mammogram screenings and can determine if they are considered high risk to develop breast cancer within their lifetime. They can determine their risk through the National Cancer Institute's (NCI) Breast Cancer Risk Assessment Tool located at [employer].co.za this tool asks 8 questions then gives a lifetime prediction of developing breast cancer. If an individual's lifetime risk is 20% or higher they are considered to be high risk to develop breast cancer. Recommendations for these women are to alternate breast MRIs and mammograms every 6 months.     It is still possible that Doloris's children could have inherited a hereditary cancer syndrome from their father's family. If there is a paternal family history of cancer we recommend they consider seeing a Dentist to review the family history and determine if genetic testing is appropriate.       Plan: Prisha can reach out to our clinic in 1-2 years to determine if there are newer genes for breast cancer susceptibility available for testing.     Time: 30 minutes. Discussed test results and implications for family members. Determined who in the family still should consider pursuing genetic testing.         Salvatore Marvel, MS [facility]  Certified Genetic Counselor  [facility]  [address]  [address] c/o Genetics team  [place], [place] [address]    Email: [email or web address]  Telephone [phone]  Fax: [phone]

## 2019-06-22 MED ORDER — COLCHICINE 0.6 MG PO TAB
0.6 mg | ORAL_TABLET | Freq: Two times a day (BID) | ORAL | 0 refills | Status: DC
Start: 2019-06-22 — End: 2019-08-13

## 2019-06-22 MED ORDER — FUROSEMIDE 40 MG PO TAB
40 mg | ORAL_TABLET | Freq: Every day | ORAL | 0 refills | 90.00000 days | Status: DC | PRN
Start: 2019-06-22 — End: 2019-09-13

## 2019-06-29 MED ORDER — PANTOPRAZOLE 40 MG PO TBEC
40 mg | ORAL_TABLET | Freq: Two times a day (BID) | ORAL | 1 refills | 90.00000 days | Status: AC
Start: 2019-06-29 — End: ?

## 2019-07-07 ENCOUNTER — Encounter: Admit: 2019-07-07 | Discharge: 2019-07-07 | Payer: MEDICARE

## 2019-07-08 ENCOUNTER — Ambulatory Visit: Admit: 2019-07-08 | Discharge: 2019-07-08 | Payer: MEDICARE

## 2019-07-08 ENCOUNTER — Encounter: Admit: 2019-07-08 | Discharge: 2019-07-08 | Payer: MEDICARE

## 2019-07-12 ENCOUNTER — Encounter: Admit: 2019-07-12 | Discharge: 2019-07-12 | Payer: MEDICARE

## 2019-07-12 ENCOUNTER — Ambulatory Visit: Admit: 2019-07-12 | Discharge: 2019-07-12 | Payer: MEDICARE

## 2019-07-12 DIAGNOSIS — R0602 Shortness of breath: Secondary | ICD-10-CM

## 2019-07-12 DIAGNOSIS — Z8616 History of COVID-19: Secondary | ICD-10-CM

## 2019-07-12 DIAGNOSIS — U071 Pneumonia due to COVID-19 virus: Secondary | ICD-10-CM

## 2019-07-12 DIAGNOSIS — I10 Essential (primary) hypertension: Secondary | ICD-10-CM

## 2019-07-12 DIAGNOSIS — I5032 Chronic diastolic (congestive) heart failure: Secondary | ICD-10-CM

## 2019-07-12 DIAGNOSIS — I313 Pericardial effusion (noninflammatory): Secondary | ICD-10-CM

## 2019-07-12 DIAGNOSIS — R600 Localized edema: Secondary | ICD-10-CM

## 2019-07-12 DIAGNOSIS — E782 Mixed hyperlipidemia: Secondary | ICD-10-CM

## 2019-07-12 DIAGNOSIS — R0600 Dyspnea, unspecified: Secondary | ICD-10-CM

## 2019-07-12 DIAGNOSIS — Z9229 Personal history of other drug therapy: Secondary | ICD-10-CM

## 2019-07-12 DIAGNOSIS — T887XXA Unspecified adverse effect of drug or medicament, initial encounter: Secondary | ICD-10-CM

## 2019-07-12 DIAGNOSIS — G4733 Obstructive sleep apnea (adult) (pediatric): Secondary | ICD-10-CM

## 2019-07-12 MED ORDER — PERFLUTREN LIPID MICROSPHERES 1.1 MG/ML IV SUSP
1-20 mL | Freq: Once | INTRAVENOUS | 0 refills | Status: CP | PRN
Start: 2019-07-12 — End: ?

## 2019-07-13 ENCOUNTER — Encounter: Admit: 2019-07-13 | Discharge: 2019-07-13 | Payer: MEDICARE

## 2019-07-13 NOTE — Telephone Encounter
Left message with echo results per [facility] and left nursing line number for further questions or concerns.

## 2019-07-13 NOTE — Telephone Encounter
-----   Message from Dorris Fetch, MD sent at 07/13/2019  3:08 PM CDT -----  Please let the patient know that her echocardiogram looks good, it was no evidence of fluid around the heart.Thank you  ----- Message -----  From: Adonis Huguenin, MD  Sent: 07/12/2019   5:46 PM CDT  To: Dorris Fetch, MD

## 2019-07-15 ENCOUNTER — Encounter: Admit: 2019-07-15 | Discharge: 2019-07-15 | Payer: MEDICARE

## 2019-07-15 DIAGNOSIS — K929 Disease of digestive system, unspecified: Secondary | ICD-10-CM

## 2019-07-15 DIAGNOSIS — C801 Malignant (primary) neoplasm, unspecified: Secondary | ICD-10-CM

## 2019-07-15 DIAGNOSIS — I10 Essential (primary) hypertension: Secondary | ICD-10-CM

## 2019-07-15 DIAGNOSIS — F99 Mental disorder, not otherwise specified: Secondary | ICD-10-CM

## 2019-07-15 DIAGNOSIS — G4489 Other headache syndrome: Secondary | ICD-10-CM

## 2019-07-15 DIAGNOSIS — T887XXA Unspecified adverse effect of drug or medicament, initial encounter: Secondary | ICD-10-CM

## 2019-07-15 DIAGNOSIS — E782 Mixed hyperlipidemia: Secondary | ICD-10-CM

## 2019-07-15 DIAGNOSIS — Z9189 Other specified personal risk factors, not elsewhere classified: Secondary | ICD-10-CM

## 2019-07-15 DIAGNOSIS — Z23 Encounter for immunization: Secondary | ICD-10-CM

## 2019-07-15 DIAGNOSIS — I5032 Chronic diastolic (congestive) heart failure: Secondary | ICD-10-CM

## 2019-07-15 DIAGNOSIS — Z9229 Personal history of other drug therapy: Secondary | ICD-10-CM

## 2019-07-15 DIAGNOSIS — Z8616 History of COVID-19: Secondary | ICD-10-CM

## 2019-07-15 DIAGNOSIS — J189 Pneumonia, unspecified organism: Secondary | ICD-10-CM

## 2019-07-15 DIAGNOSIS — U071 Pneumonia due to COVID-19 virus: Secondary | ICD-10-CM

## 2019-07-15 DIAGNOSIS — R600 Localized edema: Secondary | ICD-10-CM

## 2019-07-15 DIAGNOSIS — E079 Disorder of thyroid, unspecified: Secondary | ICD-10-CM

## 2019-07-15 DIAGNOSIS — R0600 Dyspnea, unspecified: Secondary | ICD-10-CM

## 2019-07-15 DIAGNOSIS — M1990 Unspecified osteoarthritis, unspecified site: Secondary | ICD-10-CM

## 2019-07-15 DIAGNOSIS — G5603 Carpal tunnel syndrome, bilateral upper limbs: Secondary | ICD-10-CM

## 2019-07-15 DIAGNOSIS — I313 Pericardial effusion (noninflammatory): Secondary | ICD-10-CM

## 2019-07-15 DIAGNOSIS — E785 Hyperlipidemia, unspecified: Secondary | ICD-10-CM

## 2019-07-15 NOTE — Progress Notes
Date of Service: 07/15/2019    Alicia Fox is a 69 y.o. female.       HPI     Alicia Fox is a 69 year old white female, she was last seen in our office in March 2021.    She does have a history of morbid obesity, hyperlipidemia, history of left lower lobe carcinoid lung tumor, patient did undergo surgical intervention in January 2019.    She was admitted to [facility] in December with symptoms of acute hypoxic respiratory failure, at the time patient did experience COVID-19 pneumonia.    She continues to have symptoms of dyspnea, she was evaluated in March and diagnosed with a pericardial effusion, she did undergo pericardiocentesis with removal of 1050 cc of fluid.  Following this patient was started on furosemide, spironolactone, colchicine.  The fluid cytology was negative for malignancy.    At the last office visit I did discontinue the furosemide and spironolactone, I asked the patient to continue the amlodipine and the losartan.    She was evaluated with a 2D echo Doppler study on 07/12/2019, this revealed normal left ventricular systolic function, no pericardial effusion was present.    Patient is doing well from a cardiac standpoint.  She does not have symptoms of chest pain or heart palpitations.    It is remarkable that 05/05/2019 when she weighed 284 pounds, patient lost 30 pounds, today she weighed 250 pounds.  She states that she was essentially able to do these by eating less and can clearly carting with a sodium out of her diet.         Vitals:    07/15/19 1129 07/15/19 1137   BP: 120/62 112/74   BP Source: Arm, Left Upper Arm, Right Upper   Patient Position: Sitting Sitting   Pulse: 84    SpO2: 97%    Weight: 113.5 kg (250 lb 3.2 oz)    Height: 1.6 m (5' 3)    PainSc: Zero      Body mass index is 44.32 kg/m?Marland Kitchen     Past Medical History  Patient Active Problem List    Diagnosis Date Noted   ? History of COVID-19 05/13/2019     Priority: Medium   ? Other headache syndrome 05/28/2019   ? COVID-19 vaccine administered 05/28/2019   ? Pericardial effusion 05/11/2019   ? Shortness of breath with exposure to severe acute respiratory syndrome coronavirus 2 (SARS-CoV-2) 04/05/2019   ? Morbid obesity (HCC) 02/18/2019   ? Chest wall pain 02/18/2019   ? Pneumonia due to COVID-19 virus 02/15/2019   ? COVID-19 02/05/2019   ? OSA (obstructive sleep apnea) 02/17/2018     Split night Pap titration dated 10/21/2017 at the [place] sleep lab showed severe obstructive sleep apnea with AHI of 105.6 events per hour successful CPAP titration 12 cmH2O using full facemask elevated frequency of.  Limb movement with index of 30.8 events per hour and nocturnal hypoxemia with oxygen desaturation nadir of 77% oxygen saturation below 88% for about 99.1 minutes      CPAP compliance for the past 1 month showed 100% usage for more than 4 hours average daily use of 4 hours and 24 minutes CPAP of 12 residual AHI 0.5 events per hour     ? 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 Carpal tunnel syndrome of right wrist 10/22/2016   ? Carpal tunnel syndrome of left wrist 10/09/2016     Added automatically from request for surgery [id number]     ?  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 Hypercalcemia 08/09/2016   ? Carpal tunnel syndrome, bilateral 08/06/2016   ? Abnormal CT of the head 08/06/2016   ? Cough 08/09/2014   ? Framingham cardiac risk <10% in next 10 years 08/09/2014   ? Medication side effects 05/18/2014   ? Bilateral edema of lower extremity 04/28/2014   ? Dyspnea on exertion 04/28/2014   ? Diastolic heart failure (HCC) 04/28/2014   ? Hyperlipemia 01/18/2010   ? Hypertension 07/21/2008   ? 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 Obesity 07/21/2008         Review of Systems   Constitution: Positive for malaise/fatigue.   HENT: Negative.    Eyes: Negative.    Cardiovascular: Positive for leg swelling.   Respiratory: Positive for cough.    Endocrine: Negative.    Hematologic/Lymphatic: Negative.    Skin: Negative.    Musculoskeletal: Negative.    Gastrointestinal: Negative.    Genitourinary: Negative.    Neurological: Positive for weakness.   Psychiatric/Behavioral: Negative.    Allergic/Immunologic: Negative.        Physical Exam  General Appearance: normal in appearance  Skin: warm, moist, no ulcers or xanthomas  Eyes: conjunctivae and lids normal, pupils are equal and round  Lips & Oral Mucosa: no pallor or cyanosis  Neck Veins: neck veins are flat, neck veins are not distended  Chest Inspection: chest is normal in appearance  Respiratory Effort: breathing comfortably, no respiratory distress  Auscultation/Percussion: lungs clear to auscultation, no rales or rhonchi, no wheezing  Cardiac Rhythm: regular rhythm and normal rate  Cardiac Auscultation: S1, S2 normal, no rub, no gallop  Murmurs: no murmur  Carotid Arteries: normal carotid upstroke bilaterally, no bruit  Lower Extremity Edema: no lower extremity edema  Abdominal Exam: soft, non-tender, no masses, bowel sounds normal  Liver & Spleen: no organomegaly  Language and Memory: patient responsive and seems to comprehend information  Neurologic Exam: neurological assessment grossly intact      Cardiovascular Studies      Problems Addressed Today  Encounter Diagnoses   Name Primary?   ? Essential hypertension Yes   ? Mixed hyperlipidemia    ? Chronic diastolic heart failure (HCC)    ? Pericardial effusion    ? Hypercalcemia    ? History of COVID-19    ? Fen-phen history    ? Class 3 severe obesity due to excess calories without serious comorbidity with body mass index (BMI) of 40.0 to 44.9 in adult [facility])    ? Bilateral edema of lower extremity    ? Dyspnea on exertion    ? Medication side effects    ? Framingham cardiac risk <10% in next 10 years    ? COVID-19    ? Morbid obesity (HCC)    ? Pneumonia due to COVID-19 virus    ? Other headache syndrome    ? COVID-19 vaccine administered        Assessment and Plan     In summary: This is a 69 year old female with a history of hypertension, hyperlipidemia, history of morbid obesity, s/p weight loss of approximately 34 pounds since March 2021 by completely eliminating sodium from her diet, history of COVID-19 pneumonia, history of pericardial effusion s/p pericardiocentesis, no recurrent evidence of pericardial effusion on the most recent echocardiogram.    Patient also has a history of lung cancer and continues to follow at [facility].    Plan:    1.  Decrease amlodipine to 2.5 mg p.o. daily, continue losartan 50 mg p.o. twice daily  2.  I recommended to continue weight loss  3.  Follow-up office visit in 6 months         Current Medications (including today's revisions)  ? acetaminophen (TYLENOL) 325 mg tablet Take two tablets by mouth every 6 hours as needed. For fever or pain.   ? albuterol 0.083% (PROVENTIL) 2.5 mg /3 mL (0.083 %) nebulizer solution USE 1 VIAL IN NEBULIZER EVEYR 6 HOURS AS NEEDED FOR SHORTNESS OF BREATH/WHEEZING   ? amLODIPine (NORVASC) 2.5 mg tablet Take one tablet by mouth twice daily.   ? atorvastatin (LIPITOR) 10 mg tablet TAKE 1 TABLET BY MOUTH EVERY DAY   ? cetirizine (ZYRTEC) 10 mg tablet Take 10 mg by mouth daily.   ? cholecalciferol (VITAMIN D-3) 1,000 units tablet Take 1,000 Units by mouth daily.   ? colchicine 0.6 mg tablet Take one tablet by mouth twice daily   ? dextromethorphan/guaiFENesin (ROBITUSSIN-DM) 10/100 mg/5 mL syrp oral syrup Take 10 mL by mouth as Needed.   ? furosemide (LASIX) 40 mg tablet Take one tablet by mouth daily as needed.   ? gabapentin (NEURONTIN) 100 mg capsule Take one capsule by mouth twice daily.   ? guaiFENesin LA (MUCINEX) 600 mg tablet Take one tablet by mouth twice daily.   ? ipratropium bromide (ATROVENT) 0.02 % nebulizer solution Inhale 2.5 mL by mouth into the lungs twice daily as needed for Shortness of Breath or Wheezing. Indications: bronchospams, chronic cough with covid   ? levothyroxine (SYNTHROID) 88 mcg tablet TAKE 1 TABLET BY MOUTH EVERY DAY IN THE MORNING ? losartan (COZAAR) 50 mg tablet Take one tablet by mouth twice daily.   ? melatonin 10 mg cap Take 10 mg by mouth daily.   ? ondansetron (ZOFRAN) 4 mg tablet Take one tablet by mouth every 8 hours as needed for Nausea or Vomiting.   ? pantoprazole DR (PROTONIX) 40 mg tablet Take one tablet by mouth twice daily.   ? simethicone (MYLICON) 80 mg chew tablet Chew one tablet by mouth four times daily. (Patient taking differently: Chew 80 mg by mouth as Needed.)   ? vit C,E-Zn-coppr-lutein-zeaxan (PRESERVISION AREDS-2) 250-200-40-1 mg-unit-mg-mg cap Take 1 capsule by mouth twice daily.

## 2019-08-05 ENCOUNTER — Encounter: Admit: 2019-08-05 | Discharge: 2019-08-05 | Payer: MEDICARE

## 2019-08-05 NOTE — Telephone Encounter
-----   Message -----  From: Heloise Purpura, RN  Sent: 08/05/2019   4:15 PM CDT  To: Cvm Nurse [place]  Subject: FW: Visit Follow-Up Question                       ----- Message -----  From: [name]  Sent: 08/05/2019   2:04 PM CDT  To: Cvm Nurse [facility] [place]  Subject: Visit Follow-Up Question                         I would like to be setup at [facility] for cardiac rehab. I'm trying very hard to reduce the sodium and have lost few more pounds since I've seen you but want to start some exercising. 2 yrs ago I was there after cancer lung surgery and it was very beneficial.  I'm under a lot of stress this past 10 days. Exercise would help but cannot walk due to knees and back.  I do have an appt with my orthopedic dr Jun 8th in [place]. I cannot do treadmills but they have other machines plus I like the guarded way they test bp before, during and after exercise.  My 33 yr old daughter had a heart attack 10 days ago and is being setup for cardiac rehab. It will do [place] both good to be together and support each other. She had 3 arteries blocked one at 100%, 85-90% for other two. She was life-flighted to [facility] and surgery performed with hr.   [facility] needs dr orders before I could signup for their program. With having Heart Failure Due To Covid per [facility],  I'm hoping insurance might pay for it.   Please let me know as soon as possible.   Thanks for your time and assistance.   Katalina Magri  [phone]

## 2019-08-05 NOTE — Telephone Encounter
08/05/2019 5:04 PM   Patient requesting cardiac rehab at [facility] for heart failure. Will review request with Dr Avie Arenas when she returns next week. Message sent to [facility] staff.  Colvin Caroli, LPN

## 2019-08-09 ENCOUNTER — Encounter: Admit: 2019-08-09 | Discharge: 2019-08-09 | Payer: MEDICARE

## 2019-08-09 ENCOUNTER — Ambulatory Visit: Admit: 2019-08-09 | Discharge: 2019-08-09 | Payer: MEDICARE

## 2019-08-09 DIAGNOSIS — Z1231 Encounter for screening mammogram for malignant neoplasm of breast: Secondary | ICD-10-CM

## 2019-08-10 ENCOUNTER — Encounter: Admit: 2019-08-10 | Discharge: 2019-08-10 | Payer: MEDICARE

## 2019-08-13 ENCOUNTER — Encounter: Admit: 2019-08-13 | Discharge: 2019-08-13 | Payer: MEDICARE

## 2019-08-13 MED ORDER — COLCHICINE 0.6 MG PO TAB
ORAL_TABLET | Freq: Two times a day (BID) | 1 refills | Status: DC
Start: 2019-08-13 — End: 2019-10-01

## 2019-08-16 ENCOUNTER — Encounter: Admit: 2019-08-16 | Discharge: 2019-08-16 | Payer: MEDICARE

## 2019-08-16 NOTE — Telephone Encounter
Contacted patient about appointment scheduled with Dr. Curly Rim on Wednesday 08/18/19 @ 9;15 am  Patient confirmed appointment-yes  Appointment was confirmed in the chart-yes      Patient will log in and get new patient packet done via MyChart before her appt.

## 2019-08-18 ENCOUNTER — Encounter: Admit: 2019-08-18 | Discharge: 2019-08-18 | Payer: MEDICARE

## 2019-08-18 ENCOUNTER — Ambulatory Visit: Admit: 2019-08-18 | Discharge: 2019-08-19 | Payer: MEDICARE

## 2019-08-18 DIAGNOSIS — E785 Hyperlipidemia, unspecified: Secondary | ICD-10-CM

## 2019-08-18 DIAGNOSIS — M1990 Unspecified osteoarthritis, unspecified site: Secondary | ICD-10-CM

## 2019-08-18 DIAGNOSIS — E079 Disorder of thyroid, unspecified: Secondary | ICD-10-CM

## 2019-08-18 DIAGNOSIS — M5441 Lumbago with sciatica, right side: Secondary | ICD-10-CM

## 2019-08-18 DIAGNOSIS — I10 Essential (primary) hypertension: Secondary | ICD-10-CM

## 2019-08-18 DIAGNOSIS — K929 Disease of digestive system, unspecified: Secondary | ICD-10-CM

## 2019-08-18 DIAGNOSIS — G5711 Meralgia paresthetica, right lower limb: Secondary | ICD-10-CM

## 2019-08-18 DIAGNOSIS — G5603 Carpal tunnel syndrome, bilateral upper limbs: Secondary | ICD-10-CM

## 2019-08-18 DIAGNOSIS — C801 Malignant (primary) neoplasm, unspecified: Secondary | ICD-10-CM

## 2019-08-18 DIAGNOSIS — M172 Bilateral post-traumatic osteoarthritis of knee: Secondary | ICD-10-CM

## 2019-08-18 DIAGNOSIS — F99 Mental disorder, not otherwise specified: Secondary | ICD-10-CM

## 2019-08-18 DIAGNOSIS — J189 Pneumonia, unspecified organism: Secondary | ICD-10-CM

## 2019-08-18 MED ORDER — RXAMB AMITR/GABAPEN/EMU OIL 4/4/10% CREAM (COMPOUND)
Freq: Four times a day (QID) | 11 refills | 22.50000 days | Status: AC
Start: 2019-08-18 — End: ?
  Filled 2019-08-20: qty 50, 25d supply, fill #1

## 2019-08-18 MED ORDER — GABAPENTIN 100 MG PO CAP
200 mg | ORAL_CAPSULE | Freq: Two times a day (BID) | ORAL | 3 refills | Status: DC
Start: 2019-08-18 — End: 2019-08-30

## 2019-08-18 NOTE — Progress Notes
Obtained patient's verbal consent to treat them and their agreement to [facility] financial policy and NPP via this telehealth visit during the [facility]    SPINE CENTER HISTORY AND PHYSICAL    Chief Complaint   Patient presents with   ? Pain     low back pain, ongoing for years. right leg is getting numb lately. has had knee injections. PT and EPI in the past for her back.             HISTORY OF PRESENT ILLNESS:  Alicia Fox is a 69 year old female with history of diastolic heart failure and COVID-19, who presents for evaluation treatment of low back pain.  Patient reports pain is been present 0 to 6 months.  Pain is located low back.  She has been given diagnosis of a pinched nerve.  This is not work-related.  Temporally, pain is intermittent.  Quality pain is aching, stabbing, throbbing.  Pain is better with rest and sitting down.  Pain is worse with sitting, standing, walking, bending.  She can sit 15-30 minutes, stand 0 to 15 minutes, walk 0 to 50 minutes before pain occurs.  She denies radiation.  She reports some numbness to the leg extending to the knee.  This is primarily on the lateral aspect.  She reports some weakness.  She denies loss of bowel bladder function.  Pain is not worse at night.  She denies unintentional weight loss.  She has lost 45 pounds since March 2021.  VAS pain score is rated at 5/10.  She has not had imaging.  She has had physical therapy.  She had epidural steroid injections last in 2007 with good relief.  She has been on ibuprofen with some relief.  She has been on hydrocodone and oxycodone, but was unable to tolerate.         Medical History:   Diagnosis Date   ? Arthritis     lumbar, knees   ? Cancer [facility])     breast   ? Carpal tunnel syndrome on both sides 2018    L > R   ? Disorder of thyroid gland     hypothyroid   ? Gastrointestinal disorder     gerd at times-under control with meds   ? HTN (hypertension)    ? Hyperlipidemia    ? Pneumonia     bilateral bacterial   ? Psychiatric illness     depression       Surgical History:   Procedure Laterality Date   ? HX MASTECTOMY Bilateral 1986    1986-stomberg mastectomy   ? BREAST BIOPSY  07/1984   ? MASTECTOMY, PARTIAL Bilateral 12/1984   ? MASTECTOMY, MODIFIED RADICAL Bilateral 1987    1987-full modified   ? BREAST SURGERY Bilateral 07/1985    implants   ? HX DILATION AND CURETTAGE  07/1994   ? HX HYSTERECTOMY  10/1994    complete   ? HX ABDOMINOPLASTY  10/1994   ? HIP SURGERY  02/1995   ? HX LIPOMA RESECTION  11/1999    outer upper left arm   ? ANUS SURGERY  01/2010    anal fissure   ? HX LIPOMA RESECTION  02/2010    inside upper right arm   ? LEFT DECOMPRESSION NERVE MEDIAN WITH CARPAL TUNNEL RELEASE Left 10/17/2016    Performed by Delice Lesch, MD at [facility] OR   ? BRONCHOSCOPY N/A 01/13/2017    Performed by Audie Box, MD at [facility] OR   ?  BRONCHOSCOPY WITH TRANSBRONCHIAL BIOPSY N/A 01/13/2017    Performed by Audie Box, MD at [facility] OR   ? BRONCHOSCOPY WITH ENDOBRONCHIAL ULTRASOUND GUIDED TRANSTRACHEAL/ TRANSBRONCHIAL SAMPLING - 3 OR MORE MEDIASTINAL/ HILAR LYMPH NODE STATIONS/ STRUCTURE - RIGID N/A 01/13/2017    Performed by Audie Box, MD at [facility] OR   ? BRONCHOSCOPY WITH IMAGE - GUIDED NAVIGATION - FLEXIBLE N/A 01/13/2017    Performed by Audie Box, MD at [facility] OR   ? ROBOTIC LEFT LOWER LOBECTOMY Left 03/21/2017    Performed by Particia Nearing, MD at [facility]   ? SPINE SURGERY  1993 and 1996    cervical fusion       family history includes Atrial Fibrillation in her mother; COPD in her father; Essie Christine (age of onset: 54) in her maternal aunt; Cancer-Breast (age of onset: 23) in her paternal grandmother; Cancer-Hematologic in her maternal grandfather; Cancer-Prostate (age of onset: 74) in her brother; Complete Hysterectomy (age of onset: 75) in her mother; Heart Attack in her paternal uncle; Heart Failure in her father; Hypertension in her father and mother; Intellectual Disability in her brother; Kidney Cancer (age of onset: 73) in her brother.    Social History     Socioeconomic History   ? Marital status: Widowed     Spouse name: Not on file   ? Number of children: Not on file   ? Years of education: Not on file   ? Highest education level: Not on file   Occupational History   ? Occupation: retired   Tobacco Use   ? Smoking status: Former Smoker     Quit date: 03/04/1989     Years since quitting: 30.4   ? Smokeless tobacco: Never Used   ? Tobacco comment: quit in 1991   Substance and Sexual Activity   ? Alcohol use: Not Currently     Comment: very very rare- 2 years since last drink   ? Drug use: No   ? Sexual activity: Not on file   Other Topics Concern   ? Not on file   Social History Narrative   ? Not on file               Allergies   Allergen Reactions   ? Codeine HEADACHE and ITCHING         Current Outpatient Medications:   ?  acetaminophen (TYLENOL) 325 mg tablet, Take two tablets by mouth every 6 hours as needed. For fever or pain., Disp:  , Rfl:   ?  albuterol 0.083% (PROVENTIL) 2.5 mg /3 mL (0.083 %) nebulizer solution, USE 1 VIAL IN NEBULIZER EVEYR 6 HOURS AS NEEDED FOR SHORTNESS OF BREATH/WHEEZING, Disp: 150 mL, Rfl: 1  ?  AMITR/GABAPEN/EMU OIL 06/05/08% CREAM (COMPOUND), Apply small amount (0.5 mg) to affect area four times daily as needed., Disp: 50 g, Rfl: 11  ?  amLODIPine (NORVASC) 2.5 mg tablet, Take one tablet by mouth twice daily., Disp: 180 tablet, Rfl: 2  ?  atorvastatin (LIPITOR) 10 mg tablet, TAKE 1 TABLET BY MOUTH EVERY DAY, Disp: 90 tablet, Rfl: 4  ?  cetirizine (ZYRTEC) 10 mg tablet, Take 10 mg by mouth daily., Disp: , Rfl:   ?  cholecalciferol (VITAMIN D-3) 1,000 units tablet, Take 1,000 Units by mouth daily., Disp: , Rfl:   ?  colchicine 0.6 mg tablet, TAKE 1 TABLET BY MOUTH TWICE A DAY, Disp: 180 tablet, Rfl: 1  ?  dextromethorphan/guaiFENesin (ROBITUSSIN-DM) 10/100 mg/5 mL syrp oral syrup, Take 10 mL  by mouth as Needed., Disp: , Rfl:   ?  furosemide (LASIX) 40 mg tablet, Take one tablet by mouth daily as needed., Disp: 90 tablet, Rfl: 0  ?  gabapentin (NEURONTIN) 100 mg capsule, Take two capsules by mouth twice daily., Disp: 120 capsule, Rfl: 3  ?  guaiFENesin LA (MUCINEX) 600 mg tablet, Take one tablet by mouth twice daily., Disp: 60 tablet, Rfl: 0  ?  ipratropium bromide (ATROVENT) 0.02 % nebulizer solution, Inhale 2.5 mL by mouth into the lungs twice daily as needed for Shortness of Breath or Wheezing. Indications: bronchospams, chronic cough with covid, Disp: 125 mL, Rfl: 1  ?  levothyroxine (SYNTHROID) 88 mcg tablet, TAKE 1 TABLET BY MOUTH EVERY DAY IN THE MORNING, Disp: 90 tablet, Rfl: 1  ?  losartan (COZAAR) 50 mg tablet, Take one tablet by mouth twice daily., Disp: 180 tablet, Rfl: 3  ?  melatonin 10 mg cap, Take 10 mg by mouth daily., Disp: , Rfl:   ?  ondansetron (ZOFRAN) 4 mg tablet, Take one tablet by mouth every 8 hours as needed for Nausea or Vomiting., Disp: 30 tablet, Rfl: 1  ?  pantoprazole DR (PROTONIX) 40 mg tablet, Take one tablet by mouth twice daily., Disp: 180 tablet, Rfl: 1  ?  simethicone (MYLICON) 80 mg chew tablet, Chew one tablet by mouth four times daily. (Patient taking differently: Chew 80 mg by mouth as Needed.), Disp: 30 tablet, Rfl: 0  ?  vit C,E-Zn-coppr-lutein-zeaxan (PRESERVISION AREDS-2) 250-200-40-1 mg-unit-mg-mg cap, Take 1 capsule by mouth twice daily., Disp: , Rfl:     Vitals:    08/18/19 0922   BP: 126/65   Pulse: 80   Weight: 107.5 kg (237 lb)   Height: 160 cm (63)   PainSc: Five       Oswestry Total Score:: 46    Opioid Risk Category: Low Risk (08/18/2019  9:26 AM)    Is a controlled substance agreement on file?Yes    Pain Score: Five(varies daily)    Body mass index is 41.98 kg/m?Marland Kitchen    Review of Systems         PHYSICAL EXAM:    General: 69 y.o. female appears stated age, in no acute distress  HEENT: Normocephalic, atraumatic  Neck: No thyroidmegaly  Cardiovascular: Well perfused  Pulmonary: Unlabored respirations  Extremities: No cyanosis, clubbing, or edema  Skin: No lesions seen on exposed skin  Psychiatric:  Appropriate mood and affect  Musculoskeletal: No atrophy.   Neurologic: Antigravity strength in all extremities. CN II -XII grossly intact.  Alert and oriented x 3.  Sensory deficit in right lateral femoral cutaneous nerve L5 distribution on the right.        RADIOGRAPHIC EVALUATION: No imaging to review today.    IMPRESSION:    1. Chronic bilateral low back pain with right-sided sciatica    2. Meralgia paresthetica of right side    3. Bilateral post-traumatic osteoarthritis of knee    Alicia Fox is a 69 year old female with history of 45 pound weight loss over the last 3 months who demonstrates with right lateral paresthesias and neuropathic pain as well as back pain and knee pain.  Knee pain is secondary to knee osteoarthritis.      PLAN:   1.  Lifestyle modification.  I encouraged her to avoid compressive or restrictive clothing.  She can may continue with weight loss efforts.  2.  Medication.  I would like to try topical compounding cream to the affected area.  We will try gabapentin.  She was provided prescriptions.  3.  Therapy.  I will provide her with home exercises for low back and knee strengthening.  4.  Diagnostics.  I recommend radiographs of lumbar spine for further evaluation.  5.  Interventions.  Will defer at this time.  6.  Follow-up.  Patient is to follow-up in 6 weeks or earlier if needed.    Total time spent on encounter was 36 minutes including chart review, face-to-face time, counseling, and documentation.

## 2019-08-19 ENCOUNTER — Encounter: Admit: 2019-08-19 | Discharge: 2019-08-19 | Payer: MEDICARE

## 2019-08-20 ENCOUNTER — Encounter: Admit: 2019-08-20 | Discharge: 2019-08-20 | Payer: MEDICARE

## 2019-08-23 ENCOUNTER — Encounter: Admit: 2019-08-23 | Discharge: 2019-08-23 | Payer: MEDICARE

## 2019-08-25 ENCOUNTER — Encounter: Admit: 2019-08-25 | Discharge: 2019-08-25 | Payer: MEDICARE

## 2019-08-29 ENCOUNTER — Encounter: Admit: 2019-08-29 | Discharge: 2019-08-29 | Payer: MEDICARE

## 2019-08-30 ENCOUNTER — Encounter: Admit: 2019-08-30 | Discharge: 2019-08-30 | Payer: MEDICARE

## 2019-08-30 MED ORDER — GABAPENTIN 100 MG PO CAP
ORAL_CAPSULE | Freq: Every evening | 1 refills | Status: DC
Start: 2019-08-30 — End: 2019-10-05

## 2019-08-31 ENCOUNTER — Encounter: Admit: 2019-08-31 | Discharge: 2019-08-31 | Payer: MEDICARE

## 2019-09-02 ENCOUNTER — Encounter: Admit: 2019-09-02 | Discharge: 2019-09-02 | Payer: MEDICARE

## 2019-09-02 ENCOUNTER — Ambulatory Visit: Admit: 2019-09-02 | Discharge: 2019-09-02 | Payer: MEDICARE

## 2019-09-02 DIAGNOSIS — E782 Mixed hyperlipidemia: Secondary | ICD-10-CM

## 2019-09-02 DIAGNOSIS — Z0000 Encounter for general adult medical examination without abnormal findings: Secondary | ICD-10-CM

## 2019-09-02 DIAGNOSIS — R739 Hyperglycemia, unspecified: Secondary | ICD-10-CM

## 2019-09-02 DIAGNOSIS — D649 Anemia, unspecified: Secondary | ICD-10-CM

## 2019-09-02 LAB — COMPREHENSIVE METABOLIC PANEL
Lab: 0.7 mg/dL (ref 0.3–1.2)
Lab: 128 U/L — ABNORMAL HIGH (ref 25–110)
Lab: 14 U/L (ref 7–40)
Lab: 14 U/L (ref 7–56)
Lab: 141 MMOL/L (ref 137–147)
Lab: 27 MMOL/L (ref 21–30)
Lab: 3.9 MMOL/L — ABNORMAL LOW (ref 3.5–5.1)
Lab: 4.6 g/dL (ref 3.5–5.0)
Lab: >60 mL/min (ref >60)
Lab: >60 mL/min (ref >60)
Lab: 9 (ref 3–12)

## 2019-09-02 LAB — LIPID PROFILE
Lab: 113 mg/dL (ref 6.0–8.0)
Lab: 161 mg/dL — ABNORMAL LOW (ref <200)
Lab: 19 mg/dL — ABNORMAL HIGH (ref 8.5–10.6)
Lab: 92 mg/dL (ref <100)
Lab: 96 mg/dL — ABNORMAL HIGH (ref <150)

## 2019-09-02 LAB — HEMOGLOBIN A1C: Lab: 6.5 % — ABNORMAL HIGH (ref 4.0–6.0)

## 2019-09-02 LAB — CBC
Lab: 12 g/dL (ref 12.0–15.0)
Lab: 5.5 M/UL — ABNORMAL HIGH (ref 4.0–5.0)
Lab: 6.4 10*3/uL (ref 4.5–11.0)

## 2019-09-02 LAB — HEPATITIS C ANTIBODY W REFLEX HCV PCR QUANT

## 2019-09-07 ENCOUNTER — Ambulatory Visit: Admit: 2019-09-07 | Discharge: 2019-09-07 | Payer: MEDICARE

## 2019-09-07 ENCOUNTER — Encounter: Admit: 2019-09-07 | Discharge: 2019-09-07 | Payer: MEDICARE

## 2019-09-07 DIAGNOSIS — C7A090 Malignant carcinoid tumor of the bronchus and lung: Secondary | ICD-10-CM

## 2019-09-10 ENCOUNTER — Encounter: Admit: 2019-09-10 | Discharge: 2019-09-10 | Payer: MEDICARE

## 2019-09-10 MED FILL — RXAMB AMITR/GABAPEN/EMU OIL 4/4/10% CREAM (COMPOUND): 25 days supply | Qty: 50 | Fill #2 | Status: AC

## 2019-09-13 ENCOUNTER — Encounter: Admit: 2019-09-13 | Discharge: 2019-09-13 | Payer: MEDICARE

## 2019-09-13 MED ORDER — FUROSEMIDE 40 MG PO TAB
40 mg | ORAL_TABLET | Freq: Every day | ORAL | 1 refills | 90.00000 days | Status: AC | PRN
Start: 2019-09-13 — End: ?

## 2019-09-13 NOTE — Telephone Encounter
LOV 05/05/19 SOB, COVID, weight issues

## 2019-09-24 ENCOUNTER — Encounter: Admit: 2019-09-24 | Discharge: 2019-09-24 | Payer: MEDICARE

## 2019-09-28 ENCOUNTER — Encounter: Admit: 2019-09-28 | Discharge: 2019-09-28 | Payer: MEDICARE

## 2019-10-01 ENCOUNTER — Encounter: Admit: 2019-10-01 | Discharge: 2019-10-01 | Payer: MEDICARE

## 2019-10-01 ENCOUNTER — Ambulatory Visit: Admit: 2019-10-01 | Discharge: 2019-10-01 | Payer: MEDICARE

## 2019-10-01 MED ORDER — PROPOFOL 10 MG/ML IV EMUL 20 ML (INFUSION)(AM)(OR)
INTRAVENOUS | 0 refills | Status: DC
Start: 2019-10-01 — End: 2019-10-01
  Administered 2019-10-01: 17:00:00 120 ug/kg/min via INTRAVENOUS

## 2019-10-01 MED ORDER — LACTATED RINGERS IV SOLP
INTRAVENOUS | 0 refills | Status: DC
Start: 2019-10-01 — End: 2019-10-01
  Administered 2019-10-01: 17:00:00 1000.000 mL via INTRAVENOUS

## 2019-10-01 MED ORDER — PROPOFOL INJ 10 MG/ML IV VIAL
INTRAVENOUS | 0 refills | Status: DC
Start: 2019-10-01 — End: 2019-10-01
  Administered 2019-10-01: 17:00:00 30 mg via INTRAVENOUS
  Administered 2019-10-01: 17:00:00 100 mg via INTRAVENOUS

## 2019-10-01 MED ORDER — LIDOCAINE (PF) 20 MG/ML (2 %) IJ SOLN
INTRAVENOUS | 0 refills | Status: DC
Start: 2019-10-01 — End: 2019-10-01
  Administered 2019-10-01: 17:00:00 80 mg via INTRAVENOUS

## 2019-10-01 NOTE — Anesthesia Post-Procedure Evaluation
Post-Anesthesia Evaluation    Name: Alicia Fox      MRN: [id number]     DOB: [birth year]     Age: 69 y.o.     Sex: female   __________________________________________________________________________     Procedure Information     Anesthesia Start Date/Time: 10/01/19 1138    Procedures:       Colonoscopy (N/A )      ESOPHAGOGASTRODUODENOSCOPY WITH SPECIMEN COLLECTION BY BRUSHING/ WASHING (N/A )      ESOPHAGOGASTRODUODENOSCOPY WITH SNARE REMOVAL TUMOR/ POLYP/ OTHER LESION - FLEXIBLE (N/A )      ESOPHAGOGASTRODUODENOSCOPY WITH BIOPSY - FLEXIBLE (N/A )      COLONOSCOPY WITH SNARE REMOVAL TUMOR/ POLYP/ OTHER LESION    Location: ENDO 5 / ENDO/GI    Surgeons: Meyer Cory, MD          Post-Anesthesia Vitals  BP: 128/77 (07/30 1310)  Temp: 36.5 ?C (97.7 ?F) (07/30 1245)  Pulse: 68 (07/30 1310)  Respirations: 16 PER MINUTE (07/30 1310)  SpO2: 100 % (07/30 1310)  SpO2 Pulse: 72 (07/30 1310)   Vitals Value Taken Time   BP 128/77 10/01/19 1310   Temp 36.5 ?C (97.7 ?F) 10/01/19 1245   Pulse 68 10/01/19 1310   Respirations 16 PER MINUTE 10/01/19 1310   SpO2 100 % 10/01/19 1310         Post Anesthesia Evaluation Note    Evaluation location: Pre/Post  Patient participation: recovered; patient participated in evaluation  Level of consciousness: alert    Pain score: 0  Pain management: adequate    Hydration: normovolemia  Temperature: 36.0?C - 38.4?C  Airway patency: adequate    Perioperative Events       Post-op nausea and vomiting: no PONV    Postoperative Status  Cardiovascular status: hemodynamically stable  Respiratory status: spontaneous ventilation  Follow-up needed: none        Perioperative Events  Perioperative Event: No  Emergency Case Activation: No

## 2019-10-01 NOTE — Telephone Encounter
-----   Message from Meyer Cory, MD sent at 10/01/2019 12:59 PM CDT -----  Regarding: Clinic Visit  Hi Avy Barlett,    Please schedule a f/u clinic visit with [facility] when available. Dx: GERD.    Thank you!    Meyer Cory MD, MS  GI attending

## 2019-10-01 NOTE — Telephone Encounter
Appt request placed.

## 2019-10-02 ENCOUNTER — Encounter: Admit: 2019-10-02 | Discharge: 2019-10-02 | Payer: MEDICARE

## 2019-10-02 DIAGNOSIS — M1990 Unspecified osteoarthritis, unspecified site: Secondary | ICD-10-CM

## 2019-10-02 DIAGNOSIS — E785 Hyperlipidemia, unspecified: Secondary | ICD-10-CM

## 2019-10-02 DIAGNOSIS — G5603 Carpal tunnel syndrome, bilateral upper limbs: Secondary | ICD-10-CM

## 2019-10-02 DIAGNOSIS — K929 Disease of digestive system, unspecified: Secondary | ICD-10-CM

## 2019-10-02 DIAGNOSIS — J189 Pneumonia, unspecified organism: Secondary | ICD-10-CM

## 2019-10-02 DIAGNOSIS — F99 Mental disorder, not otherwise specified: Secondary | ICD-10-CM

## 2019-10-02 DIAGNOSIS — E079 Disorder of thyroid, unspecified: Secondary | ICD-10-CM

## 2019-10-02 DIAGNOSIS — I10 Essential (primary) hypertension: Secondary | ICD-10-CM

## 2019-10-02 DIAGNOSIS — C801 Malignant (primary) neoplasm, unspecified: Secondary | ICD-10-CM

## 2019-10-03 ENCOUNTER — Encounter: Admit: 2019-10-03 | Discharge: 2019-10-03 | Payer: MEDICARE

## 2019-10-04 ENCOUNTER — Encounter: Admit: 2019-10-04 | Discharge: 2019-10-04 | Payer: MEDICARE

## 2019-10-05 ENCOUNTER — Encounter: Admit: 2019-10-05 | Discharge: 2019-10-05 | Payer: MEDICARE

## 2019-10-05 ENCOUNTER — Ambulatory Visit: Admit: 2019-10-05 | Discharge: 2019-10-05 | Payer: MEDICARE

## 2019-10-05 DIAGNOSIS — G5603 Carpal tunnel syndrome, bilateral upper limbs: Secondary | ICD-10-CM

## 2019-10-05 DIAGNOSIS — Z0000 Encounter for general adult medical examination without abnormal findings: Secondary | ICD-10-CM

## 2019-10-05 DIAGNOSIS — Z780 Asymptomatic menopausal state: Secondary | ICD-10-CM

## 2019-10-05 DIAGNOSIS — K929 Disease of digestive system, unspecified: Secondary | ICD-10-CM

## 2019-10-05 DIAGNOSIS — E785 Hyperlipidemia, unspecified: Secondary | ICD-10-CM

## 2019-10-05 DIAGNOSIS — M1990 Unspecified osteoarthritis, unspecified site: Secondary | ICD-10-CM

## 2019-10-05 DIAGNOSIS — J189 Pneumonia, unspecified organism: Secondary | ICD-10-CM

## 2019-10-05 DIAGNOSIS — C801 Malignant (primary) neoplasm, unspecified: Secondary | ICD-10-CM

## 2019-10-05 DIAGNOSIS — M170 Bilateral primary osteoarthritis of knee: Secondary | ICD-10-CM

## 2019-10-05 DIAGNOSIS — F99 Mental disorder, not otherwise specified: Secondary | ICD-10-CM

## 2019-10-05 DIAGNOSIS — I10 Essential (primary) hypertension: Secondary | ICD-10-CM

## 2019-10-05 DIAGNOSIS — E079 Disorder of thyroid, unspecified: Secondary | ICD-10-CM

## 2019-10-05 MED ORDER — GABAPENTIN 100 MG PO CAP
200 mg | ORAL_CAPSULE | Freq: Two times a day (BID) | ORAL | 1 refills | Status: AC
Start: 2019-10-05 — End: ?

## 2019-10-05 MED ORDER — GABAPENTIN 100 MG PO CAP
200 mg | ORAL_CAPSULE | Freq: Two times a day (BID) | ORAL | 1 refills | Status: DC
Start: 2019-10-05 — End: 2019-10-05

## 2019-10-05 NOTE — Progress Notes
Date of Service: 10/05/2019    Alicia Fox is a 69 y.o. female.  DOB: January 07, 1951  MRN: [id number]   she was last seen by me 05/05/2019      Subjective:            She presents today for an Annual Medicare Wellness visit. She was last seen by me 05/05/2019. Patient states overall she is feeling well. She has lost 50 pounds with dietary changes. Patient still continues to have bilateral knee pain and followed with Ortho for this. She had an injection which did give her some relief. Patient has a lot on her plate with her daughter having a recent heart attack and she is traveling now to sell her mother's home. She also has some worsening pain in her knee so she saw Dr. Katrinka Blazing who recommended increasing her gabapentin.        Chief Complaint   Patient presents with   ? Annual Wellness Visit       Medical History:   Diagnosis Date   ? Arthritis     lumbar, knees   ? Cancer [facility])     breast   ? Carpal tunnel syndrome on both sides 2018    L > R   ? Disorder of thyroid gland     hypothyroid   ? Gastrointestinal disorder     gerd at times-under control with meds   ? HTN (hypertension)    ? Hyperlipidemia    ? Pneumonia     bilateral bacterial   ? Psychiatric illness     depression     Surgical History:   Procedure Laterality Date   ? HX MASTECTOMY Bilateral 1986    1986-stomberg mastectomy   ? BREAST BIOPSY  07/1984   ? MASTECTOMY, PARTIAL Bilateral 12/1984   ? MASTECTOMY, MODIFIED RADICAL Bilateral 1987    1987-full modified   ? BREAST SURGERY Bilateral 07/1985    implants   ? HX DILATION AND CURETTAGE  07/1994   ? HX HYSTERECTOMY  10/1994    complete   ? HX ABDOMINOPLASTY  10/1994   ? HIP SURGERY  02/1995   ? HX LIPOMA RESECTION  11/1999    outer upper left arm   ? ANUS SURGERY  01/2010    anal fissure   ? HX LIPOMA RESECTION  02/2010    inside upper right arm   ? LEFT DECOMPRESSION NERVE MEDIAN WITH CARPAL TUNNEL RELEASE Left 10/17/2016    Performed by Delice Lesch, MD at [facility] OR   ? BRONCHOSCOPY N/A 01/13/2017 Performed by Audie Box, MD at [facility] OR   ? BRONCHOSCOPY WITH TRANSBRONCHIAL BIOPSY N/A 01/13/2017    Performed by Audie Box, MD at [facility] OR   ? BRONCHOSCOPY WITH ENDOBRONCHIAL ULTRASOUND GUIDED TRANSTRACHEAL/ TRANSBRONCHIAL SAMPLING - 3 OR MORE MEDIASTINAL/ HILAR LYMPH NODE STATIONS/ STRUCTURE - RIGID N/A 01/13/2017    Performed by Audie Box, MD at [facility] OR   ? BRONCHOSCOPY WITH IMAGE - GUIDED NAVIGATION - FLEXIBLE N/A 01/13/2017    Performed by Audie Box, MD at [facility] OR   ? ROBOTIC LEFT LOWER LOBECTOMY Left 03/21/2017    Performed by Particia Nearing, MD at [facility] CVOR   ? Colonoscopy N/A 10/01/2019    Performed by Meyer Cory, MD at [facility] ENDO   ? ESOPHAGOGASTRODUODENOSCOPY WITH SPECIMEN COLLECTION BY BRUSHING/ WASHING N/A 10/01/2019    Performed by Meyer Cory, MD at [facility] ENDO   ? ESOPHAGOGASTRODUODENOSCOPY WITH SNARE REMOVAL TUMOR/ POLYP/ OTHER LESION -  FLEXIBLE N/A 10/01/2019    Performed by Meyer Cory, MD at [facility] ENDO   ? ESOPHAGOGASTRODUODENOSCOPY WITH BIOPSY - FLEXIBLE N/A 10/01/2019    Performed by Meyer Cory, MD at [facility] ENDO   ? COLONOSCOPY WITH SNARE REMOVAL TUMOR/ POLYP/ OTHER LESION  10/01/2019    Performed by Meyer Cory, MD at [facility] ENDO   ? SPINE SURGERY  1993 and 1996    cervical fusion     Family History   Problem Relation Age of Onset   ? Complete Hysterectomy Mother 22        uterine prolaps   ? Hypertension Mother    ? Atrial Fibrillation Mother    ? Hypertension Father    ? COPD Father    ? Heart Failure Father    ? Kidney Cancer Brother 61   ? Cancer-Prostate Brother 60   ? Intellectual Disability Brother    ? Heart Attack Paternal Uncle    ? Cancer-Hematologic Maternal Grandfather         leukemia   ? Cancer-Breast Paternal Grandmother 67   ? Cancer-Breast Maternal Aunt 1     Social History     Socioeconomic History   ? Marital status: Widowed     Spouse name: Not on file   ? Number of children: Not on file   ? Years of education: Not on file   ? Highest education level: Not on file   Occupational History   ? Occupation: retired   Tobacco Use   ? Smoking status: Former Smoker     Quit date: 03/04/1989     Years since quitting: 30.6   ? Smokeless tobacco: Never Used   ? Tobacco comment: quit in 1991   Substance and Sexual Activity   ? Alcohol use: Not Currently     Comment: very very rare- 2 years since last drink   ? Drug use: No   ? Sexual activity: Not on file   Other Topics Concern   ? Not on file   Social History Narrative   ? Not on file      I reviewed medications, allergies, problem list and tobacco history at this visit.    A Health Risk assessment was performed by the patient today & reviewed with the patient.  Health Risk Assessment Questionnaire  Current Care  List of Providers you have seen in the last two years: Yr2019;Dr A. Nagji lung cancer surgery.;[facility], pt, home health.;Annual visits cardiac Dr Avie Arenas, Dr Larina Bras. ;[facility] sleep study.;Pulmonologist Dr Godfrey Pick. Pauline Aus mammogram.; [id number];Annuals for Primary, Cardiac, Lung Surgeon, Pulmonologist. ;Covid-19 [facility] bilateral pneumonia.;[id number];Annual Primary,  Cardiac, Lung Surgeon,  Pulmonologist. ;[facility] breast Cancer.[facility] - pericardiocentesis.[facility] - mammogram.;[facility] [place]; Dr Lucilla Edin- right knee bone on bone,;Steroid shots both knees.[facility] Dr Curly Rim - pinched nerve.;[facility] Dr Meyer Cory - endoscopy/colonoscopy.  Are you receiving home health?: No  During the past 4 weeks, how would you rate your health in general?: (!) Fair    Outside Care  Since your last PCP visit, have you received care outside of [facility]?: (!) Yes  What type of care did you receive outside of [facility]? (select all that apply): (!) Specialty Visit  What is the Facility where you received care and the provider's name?: [facility]  [place];Dr Lucilla Edin    Physical Activity  Do you exercise or are you physically active?: (!) No  Diet  In the past month, were you worried whether your food would run out before you or your family had money to buy more?: No  In the past 7 days, how many times did you eat fast food or junk food or pizza?: 0  In the past 7 days, how many servings of fruits or vegetables did you eat each day?: 4-5  In the past 7 days, how many sodas and sugar sweetened drinks (regular, not diet) did you drink each day?: 0    Smoke/Tobacco Use  Are you currently a smoker?: No      Alcohol Use  Do you drink alcohol?: No          Depression Screen  Little interest or pleasure in doing things: Not at All  Feeling down, depressed or hopeless: (!) Several Days  Patient Scores:  PHQ-2: PHQ-2 Score: 1 (10/05/2019 11:34 AM)    PHQ-9: No data recorded  Interventions:  PHQ-2: PHQ-2 Score less than 3: No follow-up or recommendations are necessary at this time (10/05/2019 11:34 AM)    Depression Interventions PHQ-2/9: No data recorded      Pain  How would you rate your pain today?: (!) Moderate pain    Ambulation  Do you use any assistive devices for ambulation?: (!) Yes  What types of device? (select all that apply): Cane    Fall Risk  Does it take you longer than 30 seconds to get up and out of a chair?: No  Have you fallen in the past year?: No  Fall History (last 23mo): No Falls    Motor Vehicle Safety  Do you fasten your seat belt when you are in the car?: Yes    Sun Exposure  Do you protect yourself from the sun? For example, wear sunscreen when outside.: Yes    Hearing Loss  Do you have trouble hearing the television or radio when others do not?: No  Do you have to strain or struggle to hear/understand conversation?: No  Do you use hearing aids?: No    Cognitive Impairment  During the past 12 months, have you experienced confusion or memory loss that is happening more often or is getting worse?: No    Functional Screen  Do you live alone?: No  Do you live at: Home  Can you drive your own car or travel alone by bus or taxi?: Yes  Can you shop for groceries or clothes without help?: (!) No  Can you prepare your own meals?: Yes  Can you do your own housework without help?: (!) No  Can you handle your own money without help?: Yes  Do you need help eating, bathing, dressing, or getting around your home?: No  Do you feel safe?: Yes  Does anyone at home hurt you, hit you, or threaten you?: No  Have you ever been the victim of abuse?: No    Home Safety  Does your home have grab bars in the bathroom?: (!) No  Does your home have hand rails on stairs and steps?: Yes  Does your home have functioning smoke alarms?: Yes    Advance Directive  Do you have a living will or Advance Directive?: Yes      Dental Screen  Have you had an exam by your dentist in the last year?: (!) No    Vision Screen  Do you have diabetes?: No          No data recorded      Personal  prevention Plan reviewed with patient.    While the patient was here today, due to his/her multiple chronic conditions it would be in the best interest of the patient for me to monitor, assess and evaluate those as well. They are as follows.  Patient Active Problem List    Diagnosis Date Noted   ? History of COVID-19 05/13/2019     Priority: Medium   ? 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 COVID-19 vaccine administered 07/15/2019   ? Other headache syndrome 05/28/2019   ? COVID-19 vaccine administered 05/28/2019   ? Pericardial effusion 05/11/2019   ? Shortness of breath with exposure to severe acute respiratory syndrome coronavirus 2 (SARS-CoV-2) 04/05/2019   ? Morbid obesity (HCC) 02/18/2019   ? Chest wall pain 02/18/2019   ? Pneumonia due to COVID-19 virus 02/15/2019   ? COVID-19 02/05/2019   ? OSA (obstructive sleep apnea) 02/17/2018     Overview Note:     Split night Pap titration dated 10/21/2017 at the [facility] lab showed severe obstructive sleep apnea with AHI of 105.6 events per hour successful CPAP titration 12 cmH2O using full facemask elevated frequency of.  Limb movement with index of 30.8 events per hour and nocturnal hypoxemia with oxygen desaturation nadir of 77% oxygen saturation below 88% for about 99.1 minutes      CPAP compliance for the past 1 month showed 100% usage for more than 4 hours average daily use of 4 hours and 24 minutes CPAP of 12 residual AHI 0.5 events per hour     ? 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 Carpal tunnel syndrome of right wrist 10/22/2016   ? Carpal tunnel syndrome of left wrist 10/09/2016     Overview Note:     Added automatically from request for surgery [id number]     ? 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 Hypercalcemia 08/09/2016   ? Carpal tunnel syndrome, bilateral 08/06/2016   ? Abnormal CT of the head 08/06/2016   ? Weight loss 10/31/2015   ? Cough 08/09/2014   ? Framingham cardiac risk <10% in next 10 years 08/09/2014   ? Medication side effects 05/18/2014   ? Bilateral edema of lower extremity 04/28/2014   ? Dyspnea on exertion 04/28/2014   ? Diastolic heart failure (HCC) 04/28/2014   ? Hyperlipemia 01/18/2010   ? Hypertension 07/21/2008   ? 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 Obesity 07/21/2008     Other concerns addressed at this visit -  Bilateral knee pain    Records requested at the time of this visit:No    Prior consultations, labs, radiology reports reviewed at the time of this visit.Yes: Most recent labs, most recent specialty visits      BMI:  Body mass index is 41.49 kg/m?Marland Kitchen  No data recorded  Wt Readings from Last 10 Encounters:   10/05/19 106.2 kg (234 lb 3.2 oz)   10/01/19 103.4 kg (228 lb)   08/18/19 107.5 kg (237 lb)   07/15/19 113.5 kg (250 lb 3.2 oz)   07/12/19 113.3 kg (249 lb 12.8 oz)   05/28/19 117.5 kg (259 lb)   05/28/19 117.5 kg (259 lb)   05/16/19 122.6 kg (270 lb 3.2 oz)   05/05/19 128.8 kg (284 lb)   03/24/19 128.4 kg (283 lb)            Review of Systems   Constitution: Positive for malaise/fatigue and weight loss. Negative for  fever.   HENT: Negative for congestion.    Eyes: Negative for blurred vision.   Cardiovascular: Negative for chest pain and dyspnea on exertion.   Respiratory: Positive for cough. Negative for shortness of breath.    Skin: Negative for rash.   Musculoskeletal: Positive for joint pain and myalgias. Negative for back pain.   Gastrointestinal: Negative for abdominal pain, constipation, diarrhea, heartburn and nausea.   Neurological: Negative for headaches.   Psychiatric/Behavioral: Negative for depression. The patient is not nervous/anxious.            Objective:         ? acetaminophen (TYLENOL) 325 mg tablet Take two tablets by mouth every 6 hours as needed. For fever or pain.   ? AMITR/GABAPEN/EMU OIL 06/05/08% CREAM (COMPOUND) Apply small amount 1 pump (0.5 g) to affect area four times daily as needed.   ? amLODIPine (NORVASC) 2.5 mg tablet Take one tablet by mouth twice daily. (Patient taking differently: Take 2.5 mg by mouth daily.)   ? atorvastatin (LIPITOR) 10 mg tablet TAKE 1 TABLET BY MOUTH EVERY DAY   ? cetirizine (ZYRTEC) 10 mg tablet Take 10 mg by mouth daily.   ? cholecalciferol (VITAMIN D-3) 1,000 units tablet Take 1,000 Units by mouth daily.   ? furosemide (LASIX) 40 mg tablet TAKE ONE TABLET BY MOUTH DAILY AS NEEDED.   ? gabapentin (NEURONTIN) 100 mg capsule Take two capsules by mouth twice daily.   ? levothyroxine (SYNTHROID) 88 mcg tablet TAKE 1 TABLET BY MOUTH EVERY DAY IN THE MORNING   ? losartan (COZAAR) 50 mg tablet Take one tablet by mouth twice daily.   ? melatonin 10 mg cap Take 10 mg by mouth daily.   ? ondansetron (ZOFRAN) 4 mg tablet Take one tablet by mouth every 8 hours as needed for Nausea or Vomiting.   ? pantoprazole DR (PROTONIX) 40 mg tablet Take one tablet by mouth twice daily.   ? simethicone (MYLICON) 80 mg chew tablet Chew one tablet by mouth four times daily. (Patient taking differently: Chew 80 mg by mouth as Needed.)   ? vit C,E-Zn-coppr-lutein-zeaxan (PRESERVISION AREDS-2) 250-200-40-1 mg-unit-mg-mg cap Take 1 capsule by mouth twice daily.   ? Zinc 50 mg tab      Vitals:    10/05/19 1133   BP: 124/72   Pulse: 75   Resp: 16   Temp: 36.7 ?C (98 ?F)   SpO2: 98%   Weight: 106.2 kg (234 lb 3.2 oz)   Height: 160 cm (63)   PainSc: Six     Vitals:    10/05/19 1133   BP: 124/72   Pulse: 75         Body mass index is 41.49 kg/m?Marland Kitchen     Physical Exam  Vitals and nursing note reviewed.   Constitutional:       Appearance: Normal appearance. She is obese.   HENT:      Head: Normocephalic and atraumatic.      Right Ear: Tympanic membrane, ear canal and external ear normal.      Left Ear: Tympanic membrane, ear canal and external ear normal.      Nose: Nose normal.      Mouth/Throat:      Mouth: Mucous membranes are moist.      Pharynx: Oropharynx is clear.   Eyes:      Extraocular Movements: Extraocular movements intact.      Pupils: Pupils are equal, round, and reactive to light.   Neck:  Vascular: No carotid bruit.      Comments: No thyromegaly  Cardiovascular:      Rate and Rhythm: Normal rate and regular rhythm.      Pulses: Normal pulses.      Heart sounds: Normal heart sounds.   Pulmonary:      Effort: Pulmonary effort is normal.      Breath sounds: Normal breath sounds.   Abdominal:      General: Bowel sounds are normal.      Palpations: Abdomen is soft.   Musculoskeletal:      Cervical back: Normal range of motion and neck supple.   Lymphadenopathy:      Cervical: No cervical adenopathy.   Skin:     General: Skin is warm and dry.      Findings: No rash.   Neurological:      General: No focal deficit present.      Mental Status: She is alert and oriented to person, place, and time.   Psychiatric:         Mood and Affect: Mood normal.         Behavior: Behavior normal.         Health Maintenance   Topic Date Due   ? OSTEOPOROSIS SCREENING/MONITORING  Never done   ? SHINGLES RECOMBINANT VACCINE (2 of 2) 03/19/2018   ? INFLUENZA VACCINE  12/03/2019   ? BREAST CANCER SCREENING  08/08/2020   ? PHYSICAL (COMPREHENSIVE) EXAM 10/04/2020   ? MEDICARE ANNUAL WELLNESS VISIT  10/04/2020   ? DTAP/TDAP VACCINES (2 - Td) 12/26/2020   ? COLORECTAL CANCER SCREENING  09/30/2029   ? COVID-19 VACCINE  Completed   ? PNEUMONIA (PPSV23) VACCINE  Completed   ? HEPATITIS C SCREENING  Completed        The 10-year ASCVD risk score Denman George DC Jr., et al., 2013) is: 11.4%    Values used to calculate the score:      Age: 74 years      Sex: Female      Is Non-Hispanic African American: No      Diabetic: No      Tobacco smoker: No      Systolic Blood Pressure: 124 mmHg      Is BP treated: Yes      HDL Cholesterol: 48 MG/DL      Total Cholesterol: 161 MG/DL        Assessment and Plan:    Malerie A. Gally was seen today for annual wellness visit.    Diagnoses and all orders for this visit:    Encounter for Medicare annual wellness exam    Annual physical exam    Postmenopausal  -     BONE DENSITY SPINE/HIP; Future; Expected date: 10/05/2019    Bilateral primary osteoarthritis of knee    Patient is up-to-date on age-appropriate screening except for bone density which we will order today. Patient has had her fasting labs drawn and reviewed these with her. No changes in medications today. Continue on weight loss journey as patient is doing well and doing successfully.  Encounter Medications   Medications   ? Zinc 50 mg tab     Patient Instructions     Health Maintenance   Topic Date Due   ? OSTEOPOROSIS SCREENING/MONITORING  Never done   ? SHINGLES RECOMBINANT VACCINE (2 of 2) 03/19/2018   ? INFLUENZA VACCINE  12/03/2019   ? BREAST CANCER SCREENING  08/08/2020   ? PHYSICAL (COMPREHENSIVE) EXAM  10/04/2020   ?  MEDICARE ANNUAL WELLNESS VISIT  10/04/2020   ? DTAP/TDAP VACCINES (2 - Td) 12/26/2020   ? COLORECTAL CANCER SCREENING  09/30/2029   ? COVID-19 VACCINE  Completed   ? PNEUMONIA (PPSV23) VACCINE  Completed   ? HEPATITIS C SCREENING  Completed         Visit Disposition     Dispositions    ? Return in about 6 months (around 04/06/2020).          Future Appointments Date Time Provider Department Center   10/05/2019  8:00 PM [facility] Radiolo   11/23/2019  9:00 AM Velta Addison, MD [facility] IM   11/23/2019  9:15 AM Arlyce Harman, MD [facility] CTS   12/06/2019  3:00 PM Jamey Reas, APRN-NP [facility] IM   02/03/2020 11:00 AM Dorris Fetch, MD [facility] CVM Exam     Return in about 6 months (around 04/06/2020).    I reviewed with the patient their current medications and specifically any new medications prescribed at the time of this visit and we reviewed the expected benefits and potential side effects. All questions are answered to the patient's satisfaction.    ? Health maintenance gaps were reviewed with the patient at the time of this visit.    ? I emphasized the importance of medication adherence.   ? The medical problems/diagnoses listed under assessment and plan were addressed at this visit and unless otherwise stated are adequately controlled.        Problem   Bilateral Primary Osteoarthritis of Knee   Type 2 Diabetes Mellitus (Hcc) (Resolved)

## 2019-10-05 NOTE — Patient Instructions
Health Maintenance   Topic Date Due   ? OSTEOPOROSIS SCREENING/MONITORING  Never done   ? SHINGLES RECOMBINANT VACCINE (2 of 2) 03/19/2018   ? INFLUENZA VACCINE  12/03/2019   ? BREAST CANCER SCREENING  08/08/2020   ? PHYSICAL (COMPREHENSIVE) EXAM  10/04/2020   ? MEDICARE ANNUAL WELLNESS VISIT  10/04/2020   ? DTAP/TDAP VACCINES (2 - Td) 12/26/2020   ? COLORECTAL CANCER SCREENING  09/30/2029   ? COVID-19 VACCINE  Completed   ? PNEUMONIA (PPSV23) VACCINE  Completed   ? HEPATITIS C SCREENING  Completed

## 2019-10-10 ENCOUNTER — Encounter: Admit: 2019-10-10 | Discharge: 2019-10-10 | Payer: MEDICARE

## 2019-10-23 ENCOUNTER — Encounter: Admit: 2019-10-23 | Discharge: 2019-10-23 | Payer: MEDICARE

## 2019-10-23 MED ORDER — AMLODIPINE 2.5 MG PO TAB
ORAL_TABLET | Freq: Two times a day (BID) | 2 refills
Start: 2019-10-23 — End: ?

## 2019-12-03 ENCOUNTER — Encounter: Admit: 2019-12-03 | Discharge: 2019-12-03 | Payer: MEDICARE

## 2019-12-03 MED ORDER — LEVOTHYROXINE 88 MCG PO TAB
ORAL_TABLET | Freq: Every day | ORAL | 1 refills | 30.00000 days | Status: AC
Start: 2019-12-03 — End: ?

## 2019-12-03 NOTE — Telephone Encounter
LOV 10/05/19 MA[facility]

## 2019-12-06 ENCOUNTER — Encounter: Admit: 2019-12-06 | Discharge: 2019-12-06 | Payer: MEDICARE

## 2019-12-06 ENCOUNTER — Ambulatory Visit: Admit: 2019-12-06 | Discharge: 2019-12-07 | Payer: MEDICARE

## 2019-12-06 DIAGNOSIS — E785 Hyperlipidemia, unspecified: Secondary | ICD-10-CM

## 2019-12-06 DIAGNOSIS — K929 Disease of digestive system, unspecified: Secondary | ICD-10-CM

## 2019-12-06 DIAGNOSIS — G5603 Carpal tunnel syndrome, bilateral upper limbs: Secondary | ICD-10-CM

## 2019-12-06 DIAGNOSIS — F99 Mental disorder, not otherwise specified: Secondary | ICD-10-CM

## 2019-12-06 DIAGNOSIS — C801 Malignant (primary) neoplasm, unspecified: Secondary | ICD-10-CM

## 2019-12-06 DIAGNOSIS — M1990 Unspecified osteoarthritis, unspecified site: Secondary | ICD-10-CM

## 2019-12-06 DIAGNOSIS — I10 Essential (primary) hypertension: Secondary | ICD-10-CM

## 2019-12-06 DIAGNOSIS — E079 Disorder of thyroid, unspecified: Secondary | ICD-10-CM

## 2019-12-06 DIAGNOSIS — K219 Gastro-esophageal reflux disease without esophagitis: Secondary | ICD-10-CM

## 2019-12-06 DIAGNOSIS — J189 Pneumonia, unspecified organism: Secondary | ICD-10-CM

## 2019-12-06 DIAGNOSIS — K5900 Constipation, unspecified: Secondary | ICD-10-CM

## 2019-12-06 DIAGNOSIS — D123 Benign neoplasm of transverse colon: Secondary | ICD-10-CM

## 2019-12-06 DIAGNOSIS — Z86010 Personal history of colonic polyps: Secondary | ICD-10-CM

## 2019-12-06 DIAGNOSIS — R053 Chronic cough: Secondary | ICD-10-CM

## 2019-12-06 NOTE — Progress Notes
Telehealth Visit Note    Date of Service: 12/06/2019    Subjective:      Obtained patient's verbal consent to treat them and their agreement to [place] financial policy and NPP via this telehealth visit during the [facility] Emergency       Alicia Fox is a 69 y.o. female.    History of Present Illness    Alicia Fox is a very pleasant 69 yo Caucasian female who is an established patient of Dr. Harmon Dun. 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 She was seen by Dr. Harmon Dun initially in 01/2019 to and reported a chronic cough for years with no improvement after her left lobectomy for carcinoid tumor.  Per patient, pulmonary work-up was unrevealing.  Given concern for silent reflux she was placed on over-the-counter medication with no significant improvement so she was put on pantoprazole 40 mg daily. At the visit, no heartburn, regurgitation, dysphagia, or chest pain.  However, she reported significant belching, mostly after eating that lasts for about 10 minutes. She did not think that pantoprazole helped the belching much.  Prior CT chest with no report of hiatal hernia, but noted hepatic steatosis. Overall, she felt her cough was 80% improved with pantoprazole 40 mg once daily. She also had a history of removal of a small adenomatous polyp with a colonoscopy in 2009 although due to the ongoing pandemic she underwent Cologuard testing in 2020 which was negative.  Her PCP still recommended that she undergo a colonoscopy given prior polyp. EGD and colonoscopy was recommended.     I am seeing her today for a follow-up visit.  We discussed test results in detail.  She did complete the EGD and colonoscopy on 10/01/2019.  EGD was unremarkable except for a 7 mm nodule noted at the GE junction which was removed.  Pathology showed benign tissue.  There is no evidence of intestinal metaplasia.  There is no evidence of esophagitis.  Stomach and duodenum appeared normal.  Biopsies were negative for EOE.  Colonoscopy revealed fair prep, single a sending diverticula, normal-appearing terminal ileum, and 4 adenomatous polyps were removed.  1 of those polyps was 10 mm in size in the cecum.  Repeat colonoscopy with 2-day bowel prep recommended in 1 year.  She states she developed Covid in 02/2019 and developed long hauler symptoms.  She was admitted in 05/2019 and diagnosed with Covid related heart failure and during that admission she was reporting significant belching despite pantoprazole 40 mg daily.  During admission, pantoprazole 40 mg was increased to twice a day and the belching improved significantly.  Also, her cough resolved with twice daily PPI.  She was able to discontinue simethicone and Zofran.  Reports of nausea, vomiting, dysphagia, or odynophagia.  From 05/2019 to now she has lost around 60 pounds.  She is happy about this weight loss but since then she is having more problems with sciatica type pain which is been a real problem for her.  She lost the weight with significant lifestyle changes.  Since the weight loss, she also has noted a change in bowel pattern which is now described as a soft but sometimes hard bowel movement twice a week.  Prior to the weight loss, she had daily bowel movements.  She was previously on FiberCon for many years and recently switched  to Metamucil to see if this helps with constipation.  She is taking 3 tablets daily.  She has MiraLAX at home but is not taking it consistently.  Rectal bleeding or melena.  She does have a history of anal fissure surgery so wants to avoid any reinjury.  No reports of abdominal pain or anorexia.    Of note, most recent lab work dated 09/02/2019 revealed hemoglobin 12.2, hematocrit 38.5.          Review of Systems   Constitutional: Negative for appetite change, chills, diaphoresis, fatigue, fever and unexpected weight change.   HENT: Negative for mouth sores, sore throat, trouble swallowing and voice change.    Eyes: Negative for pain and visual disturbance.   Respiratory: Negative for choking and chest tightness.    Cardiovascular: Negative for chest pain.   Gastrointestinal: Negative for abdominal distention, abdominal pain, anal bleeding, blood in stool, constipation, diarrhea, nausea, rectal pain and vomiting.   Genitourinary: Negative for flank pain and pelvic pain.   Musculoskeletal: Negative for arthralgias and back pain.   Skin: Negative for rash.   Neurological: Negative for light-headedness and headaches.   Hematological: Negative for adenopathy.   All other systems reviewed and are negative.        Objective:         ? acetaminophen (TYLENOL) 325 mg tablet Take two tablets by mouth every 6 hours as needed. For fever or pain.   ? amLODIPine (NORVASC) 2.5 mg tablet TAKE 1 TABLET BY MOUTH TWICE A DAY (Patient taking differently: Take 2.5 mg by mouth daily.)   ? atorvastatin (LIPITOR) 10 mg tablet TAKE 1 TABLET BY MOUTH EVERY DAY   ? cetirizine (ZYRTEC) 10 mg tablet Take 10 mg by mouth daily.   ? cholecalciferol (VITAMIN D-3) 1,000 units tablet Take 1,000 Units by mouth daily.   ? furosemide (LASIX) 40 mg tablet TAKE ONE TABLET BY MOUTH DAILY AS NEEDED.   ? gabapentin (NEURONTIN) 100 mg capsule Take two capsules by mouth twice daily.   ? levothyroxine (SYNTHROID) 88 mcg tablet TAKE 1 TABLET BY MOUTH EVERY DAY IN THE MORNING   ? losartan (COZAAR) 50 mg tablet Take one tablet by mouth twice daily.   ? melatonin 10 mg cap Take 10 mg by mouth daily.   ? ondansetron (ZOFRAN) 4 mg tablet Take one tablet by mouth every 8 hours as needed for Nausea or Vomiting.   ? pantoprazole DR (PROTONIX) 40 mg tablet Take one tablet by mouth twice daily.   ? psyllium seed (with sugar) (FIBER PO) Take 100 % by mouth daily. OTC    ? vit C,E-Zn-coppr-lutein-zeaxan (PRESERVISION AREDS-2) 250-200-40-1 mg-unit-mg-mg cap Take 1 capsule by mouth twice daily.     There were no vitals filed for this visit.  There is no height or weight on file to calculate BMI.     Telehealth Patient Reported Vitals     Row Name 12/06/19 1456 12/06/19 1455             Weight:  101.2 kg (223 lb) per pt  101.2 kg (223 lb) per pt       Height:  162.6 cm (64) per pt  162.6 cm (64) per pt           Results for GIANELLY, SIME ANN MARGE (MRN [id number]) as of 12/06/2019 15:06   Ref. Range 09/02/2019 11:20   Hemoglobin Latest Ref Range: 12.0 - 15.0 GM/DL 40.1   Hematocrit Latest Ref Range: 36 - 45 %  38.5   Platelet Count Latest Ref Range: 150 - 400 K/UL 220   White Blood Cells Latest Ref Range: 4.5 - 11.0 K/UL 6.4   RBC Latest Ref Range: 4.0 - 5.0 M/UL 5.50 (H)   MCV Latest Ref Range: 80 - 100 FL 70.0 (L)   MCH Latest Ref Range: 26 - 34 PG 22.2 (L)   MCHC Latest Ref Range: 32.0 - 36.0 G/DL 96.2 (L)   MPV Latest Ref Range: 7 - 11 FL 9.4   RDW Latest Ref Range: 11 - 15 % 17.7 (H)   Sodium Latest Ref Range: 137 - 147 MMOL/L 141   Potassium Latest Ref Range: 3.5 - 5.1 MMOL/L 3.9   Chloride Latest Ref Range: 98 - 110 MMOL/L 105   CO2 Latest Ref Range: 21 - 30 MMOL/L 27   Anion Gap Latest Ref Range: 3 - 12  9   Blood Urea Nitrogen Latest Ref Range: 7 - 25 MG/DL 15   Creatinine Latest Ref Range: 0.4 - 1.00 MG/DL 9.52   eGFR Non African American Latest Ref Range: >60 mL/min >60   eGFR African American Latest Ref Range: >60 mL/min >60   Glucose Latest Ref Range: 70 - 100 MG/DL 841 (H)   Albumin Latest Ref Range: 3.5 - 5.0 G/DL 4.6   Calcium Latest Ref Range: 8.5 - 10.6 MG/DL 32.4 (H)   Total Bilirubin Latest Ref Range: 0.3 - 1.2 MG/DL 0.7   Total Protein Latest Ref Range: 6.0 - 8.0 G/DL 7.4   Hemoglobin M0N Latest Ref Range: 4.0 - 6.0 % 6.5 (H)   AST (SGOT) Latest Ref Range: 7 - 40 U/L 14   ALT (SGPT) Latest Ref Range: 7 - 56 U/L 14   Alk Phosphatase Latest Ref Range: 25 - 110 U/L 128 (H)   Cholesterol Latest Ref Range: <200 MG/DL 027 Triglycerides Latest Ref Range: <150 MG/DL 96   HDL Latest Ref Range: >40 MG/DL 48   LDL Latest Ref Range: <100 mg/dL 92   VLDL Latest Units: MG/DL 19   Non HDL Cholesterol Latest Units: MG/DL 253   Anti HCV Latest Ref Range: NRHCV-Non-Reactive: Antibodies to HCV were not detected.  Non-Reactive: Antibodies to HCV were not detected.                       Physical Exam  Constitutional:       General: She is not in acute distress.     Appearance: Normal appearance.   HENT:      Head: Normocephalic and atraumatic.      Nose: Nose normal. No rhinorrhea.      Mouth/Throat:      Mouth: Mucous membranes are moist.   Eyes:      Extraocular Movements: Extraocular movements intact.      Conjunctiva/sclera: Conjunctivae normal.   Pulmonary:      Effort: Pulmonary effort is normal. No respiratory distress.   Musculoskeletal:         General: No swelling. Normal range of motion.      Cervical back: Normal range of motion.   Skin:     Coloration: Skin is not jaundiced.   Neurological:      Mental Status: She is alert and oriented to person, place, and time.      Coordination: Coordination normal.      Gait: Gait normal.   Psychiatric:         Mood and Affect: Mood normal.  Behavior: Behavior normal.         Thought Content: Thought content normal.         Judgment: Judgment normal.              Assessment and Plan:    1. Chronic cough and postprandial belching. Currently has had significant improvement with pantoprazole 40 mg increased to twice daily 05/2019.  No associated heartburn, regurgitation, chest pain, or dysphagia.  EGD 10/01/2019 with no evidence of esophagitis.  Biopsies negative for intestinal metaplasia and EOE.    2.  Constipation.  Patient reports significant weight loss since admitted for heart failure related to [facility] 05/2019.  Last weight with healthy lifestyle changes although since then she has noticed issues with constipation described as less frequent bowel movements with intermittent hard stools. She recently switched from FiberCon to Metamucil with no clear improvement.  She has taken MiraLAX in the past but not taking it currently.  Rectal bleeding or melena.  Of note, colonoscopy 09/2019 with fair prep only.    3.  Adenomatous colon polyps.  Dr. Harmon Dun removed 4 polyps, one was 10 mm, with most recent colonoscopy 09/2019.  Repeat colonoscopy with 2-day prep recommended 1 year given finding of multiple adenomatous polyps.    4.  HCV screening.  Non-reactive HCV 09/02/2019.     5.  Postoperative anemia in 03/2017.  Most recent CBC dated 09/02/2019 with normal H/H.  Of note, no concerning findings with EGD and colonoscopy 09/2019.    Plan:   1.  Since twice daily PPI has been significantly helpful for her postprandial belching and chronic cough, she will continue pantoprazole 40 mg twice a day, 30 minutes before morning and evening meals.  At some point, may want to consider decreasing down to 20 mg twice a day to see if this controls symptoms.  She will also continue antireflux precautions.  2.  Repeat colonoscopy in 1 year with 2-day bowel prep to look for additional small colon polyps.  Patient will call around 07/2020 or have her PCP placed the referral for a colonoscopy around that time.  She is to call with any questions.  3.  For constipation, she is to increase oral fluid intake, continue Metamucil twice a day, and start MiraLAX 1 scoop daily, titrate dose as needed.  She is to call with any persistent constipation issues.  4.  For now, she can follow-up in the GI clinic on an as-needed basis.  She is to call at anytime with any questions or concerns.    Patient was advised of the plan and voiced agreement and understanding.     A total of 40 minutes were spent today on this encounter. This includes preparing for the visit, reviewing medical records, obtaining relevant history, speaking to the patient, interpreting test results, ordering medications and tests and documenting the visit in the electronic health record.     This note was in part completed with Dragon, a voice to text dictation system. Some errors may have occured and persist despite my best efforts to edit this document to eliminate dictation/translation?related errors.? If you have questions/concerns, please contact me for clarification.

## 2019-12-11 ENCOUNTER — Encounter: Admit: 2019-12-11 | Discharge: 2019-12-11 | Payer: MEDICARE

## 2019-12-21 ENCOUNTER — Encounter: Admit: 2019-12-21 | Discharge: 2019-12-21 | Payer: MEDICARE

## 2019-12-21 MED ORDER — PANTOPRAZOLE 40 MG PO TBEC
ORAL_TABLET | Freq: Two times a day (BID) | ORAL | 1 refills | 90.00000 days | Status: AC
Start: 2019-12-21 — End: ?

## 2019-12-28 ENCOUNTER — Encounter: Admit: 2019-12-28 | Discharge: 2019-12-28 | Payer: MEDICARE

## 2019-12-30 ENCOUNTER — Encounter: Admit: 2019-12-30 | Discharge: 2019-12-30 | Payer: MEDICARE

## 2020-01-07 NOTE — Progress Notes
Date of Service: 01/11/2020       Subjective:             Auralia Meiring is a 69 y.o. female.      History of Present Illness  Patriciajo Wolden presents to thoracic surgery clinic for follow up CT scan of her chest for cancer surveillance. She presented to thoracic surgery for evaluation of?biopsy-proven left lower lobe carcinoid.??On 03/21/17 she underwent a?Robotic assisted left lower lobectomy under the direction of Dr. Particia Nearing. ?Pathology impression: ?Lung: Typical carcinoid tumor. ??Greatest dimension (centimeters): 3.0 cm.?Additional dimensions (centimeters): 2.5 x 2.5 cm.??Pathologic Stage Classification (pTNM, AJCC 8th Edition): pT1c N0?    Last CT chest 09/07/19  1. ?Stable tiny subpleural right lower lobe nodule which had previously   shown slight enlargement. This may be intrapulmonary lymph node. Follow-up   chest CT in 6-12 months is recommended to assess for continued stability   given reported history of malignant carcinoid tumor.     2. ?Left lower lobectomy changes without evidence of recurrent or residual   left lung nodule.     3. ?At least moderate coronary artery calcification. Pericardial effusion   has nearly resolved.     Hospitalized 05/11/19 for pericardial effusion, 02/15/19 Pneumonia due to COVID-19    Today she reports no procedures, hospitalizations, or problems since our last visit. She reports no issues with lungs. She is having difficulty with a leg, saw orthopedic who says right knee bone on bone but also problems with sciatica, so go injections into knees, also seeing nerve specialist. Suggesting nerve blocks and steroids. Dec 2020 pretty sick hospitalized with COVID pneumonia, in March pericardial effusion ICU 1 liter, said it was heart failure due to covid so has been on sodium restriction. She denies cough, hemoptysis, shortness of air, chest pain, dysphagia, dyspepsia, abdominal pain, change in bowel habits, fevers, chills, night sweats, unintentional weight loss, new headaches, and bone pain.       This was reviewed at length with Verne Grain.  A copy of her CT report was given her.       ROS  Review of Systems   Constitutional: Negative.    HENT: Negative.    Eyes: Negative.    Respiratory: Negative.    Cardiovascular: Negative.    Gastrointestinal: Negative.    Endocrine: Negative.    Genitourinary: Negative.    Musculoskeletal: Negative.    Skin: Negative.    Allergic/Immunologic: Negative.    Neurological: Negative.    Hematological: Negative.    Psychiatric/Behavioral: Negative.      Medical History:   Diagnosis Date   ? Arthritis     lumbar, knees   ? Cancer [facility])     breast   ? Carpal tunnel syndrome on both sides 2018    L > R   ? Disorder of thyroid gland     hypothyroid   ? Gastrointestinal disorder     gerd at times-under control with meds   ? HTN (hypertension)    ? Hx of adenomatous colonic polyps    ? Hyperlipidemia    ? Pneumonia     bilateral bacterial   ? Psychiatric illness     depression     Surgical History:   Procedure Laterality Date   ? HX MASTECTOMY Bilateral 1986    1986-stomberg mastectomy   ? BREAST BIOPSY  07/1984   ? MASTECTOMY, PARTIAL Bilateral 12/1984   ? MASTECTOMY, MODIFIED RADICAL Bilateral 1987  1987-full modified   ? BREAST SURGERY Bilateral 07/1985    implants   ? HX DILATION AND CURETTAGE  07/1994   ? HX HYSTERECTOMY  10/1994    complete   ? HX ABDOMINOPLASTY  10/1994   ? HIP SURGERY  02/1995   ? HX LIPOMA RESECTION  11/1999    outer upper left arm   ? ANUS SURGERY  01/2010    anal fissure   ? HX LIPOMA RESECTION  02/2010    inside upper right arm   ? LEFT DECOMPRESSION NERVE MEDIAN WITH CARPAL TUNNEL RELEASE Left 10/17/2016    Performed by Delice Lesch, MD at [facility] OR   ? BRONCHOSCOPY N/A 01/13/2017    Performed by Audie Box, MD at [facility] OR   ? BRONCHOSCOPY WITH TRANSBRONCHIAL BIOPSY N/A 01/13/2017    Performed by Audie Box, MD at [facility] OR   ? BRONCHOSCOPY WITH ENDOBRONCHIAL ULTRASOUND GUIDED TRANSTRACHEAL/ TRANSBRONCHIAL SAMPLING - 3 OR MORE MEDIASTINAL/ HILAR LYMPH NODE STATIONS/ STRUCTURE - RIGID N/A 01/13/2017    Performed by Audie Box, MD at [facility] OR   ? BRONCHOSCOPY WITH IMAGE - GUIDED NAVIGATION - FLEXIBLE N/A 01/13/2017    Performed by Audie Box, MD at [facility] OR   ? ROBOTIC LEFT LOWER LOBECTOMY Left 03/21/2017    Performed by Particia Nearing, MD at [facility] CVOR   ? Colonoscopy N/A 10/01/2019    Performed by Meyer Cory, MD at [facility] ENDO   ? ESOPHAGOGASTRODUODENOSCOPY WITH SPECIMEN COLLECTION BY BRUSHING/ WASHING N/A 10/01/2019    Performed by Meyer Cory, MD at [facility] ENDO   ? ESOPHAGOGASTRODUODENOSCOPY WITH SNARE REMOVAL TUMOR/ POLYP/ OTHER LESION - FLEXIBLE N/A 10/01/2019    Performed by Meyer Cory, MD at [facility] ENDO   ? ESOPHAGOGASTRODUODENOSCOPY WITH BIOPSY - FLEXIBLE N/A 10/01/2019    Performed by Meyer Cory, MD at [facility] ENDO   ? COLONOSCOPY WITH SNARE REMOVAL TUMOR/ POLYP/ OTHER LESION  10/01/2019    Performed by Meyer Cory, MD at [facility] ENDO   ? SPINE SURGERY  1993 and 1996    cervical fusion     Allergies   Allergen Reactions   ? Codeine HEADACHE and ITCHING     Social History     Socioeconomic History   ? Marital status: Widowed     Spouse name: Not on file   ? Number of children: Not on file   ? Years of education: Not on file   ? Highest education level: Not on file   Occupational History   ? Occupation: retired   Tobacco Use   ? Smoking status: Former Smoker     Quit date: 03/04/1989     Years since quitting: 30.8   ? Smokeless tobacco: Never Used   ? Tobacco comment: quit in 1991   Substance and Sexual Activity   ? Alcohol use: Not Currently     Comment: very very rare- 2 years since last drink   ? Drug use: No   ? Sexual activity: Not on file   Other Topics Concern   ? Not on file   Social History Narrative   ? Not on file     Family History   Problem Relation Age of Onset   ? Complete Hysterectomy Mother 77        uterine prolaps   ? Hypertension Mother    ? Atrial Fibrillation Mother    ? Hypertension Father    ? COPD Father    ? Heart Failure Father    ?  Kidney Cancer Brother 45   ? Cancer-Prostate Brother 60   ? Intellectual Disability Brother    ? Heart Attack Paternal Uncle    ? Cancer-Hematologic Maternal Grandfather         leukemia   ? Cancer-Breast Paternal Grandmother 9   ? Cancer-Breast Maternal Aunt 84       Objective:         ? acetaminophen (TYLENOL) 325 mg tablet Take two tablets by mouth every 6 hours as needed. For fever or pain.   ? amLODIPine (NORVASC) 2.5 mg tablet TAKE 1 TABLET BY MOUTH TWICE A DAY (Patient taking differently: Take 2.5 mg by mouth daily.)   ? atorvastatin (LIPITOR) 10 mg tablet TAKE 1 TABLET BY MOUTH EVERY DAY   ? cetirizine (ZYRTEC) 10 mg tablet Take 10 mg by mouth daily.   ? cholecalciferol (VITAMIN D-3) 1,000 units tablet Take 1,000 Units by mouth daily.   ? furosemide (LASIX) 40 mg tablet TAKE ONE TABLET BY MOUTH DAILY AS NEEDED.   ? gabapentin (NEURONTIN) 100 mg capsule Take two capsules by mouth twice daily.   ? levothyroxine (SYNTHROID) 88 mcg tablet TAKE 1 TABLET BY MOUTH EVERY DAY IN THE MORNING   ? losartan (COZAAR) 50 mg tablet Take one tablet by mouth twice daily.   ? melatonin 10 mg cap Take 10 mg by mouth daily.   ? pantoprazole DR (PROTONIX) 40 mg tablet TAKE 1 TABLET BY MOUTH TWICE A DAY   ? psyllium seed (with sugar) (FIBER PO) Take 100 % by mouth daily. OTC    ? vit C,E-Zn-coppr-lutein-zeaxan (PRESERVISION AREDS-2) 250-200-40-1 mg-unit-mg-mg cap Take 1 capsule by mouth twice daily.     There were no vitals filed for this visit.  There is no height or weight on file to calculate BMI.     Physical Exam  Constitutional:       Appearance: She is well-developed.   HENT:      Head: Normocephalic.   Pulmonary:      Effort: Pulmonary effort is normal.   Musculoskeletal:         General: Normal range of motion.      Cervical back: Neck supple.   Lymphadenopathy:      Cervical: No cervical adenopathy.   Skin:     General: Skin is dry.   Neurological:      Mental Status: She is alert and oriented to person, place, and time.   Psychiatric:         Behavior: Behavior normal.         Thought Content: Thought content normal.         Judgment: Judgment normal.              Assessment and Plan:  1. Malignant carcinoid tumor of lung (HCC)    2. Pulmonary nodule    3. Encounter for follow-up surveillance of malignant carcinoid tumor of lung        Overall, we are very pleased with Verne Grain progress.  We will plan to continue with radiographic surveillance of the chest in 6 months? time (from last CT July 2021) with CT C/A/P for surveillance of carcinoid tumor per NCCN guidelines, we are recommending a shorter interval due to subpleural nodule which has previously increased in size. Rannah Hodgkins is encouraged to call should any issues or concerns arise.  We would be happy to assist in any way possible.Please do not hesitate to contact us with any questions or  concerns.    I spent 32 minutes reviewing medical records and imaging, examining the patient, along with counseling and formulating a plan of care    Jodi Marble, APRN-C  Thoracic Surgery and Lung Cancer Screening               ATTESTATION     I personally interviewed and examined the patient.  I have reviewed the history, physical, impression and plan outlined by the Nurse Practitioner.     I had the pleasure of seeing Ms. Showalter today via telehealth in thoracic surgery clinic.  As you are aware she is a 69 year old female who underwent a robotic assisted left lower lobectomy for T1CN0 typical carcinoid on March 21, 2017.  She was originally supposed to be seen in July 2021 but had multiple issues over the past year.  In December 2020 she suffered from Covid.  In March 2021 she had a significantly large pericardial effusion which was drained and was found to be nonmalignant.  However there is been a significant recovery issue with her.  Based on the CT scan in July 2021 there was no evidence of recurrence of disease however there was a tiny subpleural right lower lobe nodule which had previously shown slight enlargement.  Otherwise she denies any hemoptysis, bone pain, headaches or other constitutional symptoms.    After review of her imaging from July 2021 we will see her back in January 2022 with a repeat CT scan of her chest, abdomen, and pelvis per radiology recommendations.  I appreciate the operative participate in her care.  Please feel free to contact us any question concerns.    Staff name: Oleh Genin. Ignacia Palma, MD

## 2020-01-09 ENCOUNTER — Encounter: Admit: 2020-01-09 | Discharge: 2020-01-09 | Payer: MEDICARE

## 2020-01-11 ENCOUNTER — Encounter: Admit: 2020-01-11 | Discharge: 2020-01-11 | Payer: MEDICARE

## 2020-01-11 ENCOUNTER — Ambulatory Visit: Admit: 2020-01-11 | Discharge: 2020-01-12 | Payer: MEDICARE

## 2020-01-11 DIAGNOSIS — Z08 Encounter for follow-up examination after completed treatment for malignant neoplasm: Secondary | ICD-10-CM

## 2020-01-11 DIAGNOSIS — F99 Mental disorder, not otherwise specified: Secondary | ICD-10-CM

## 2020-01-11 DIAGNOSIS — M1990 Unspecified osteoarthritis, unspecified site: Secondary | ICD-10-CM

## 2020-01-11 DIAGNOSIS — J189 Pneumonia, unspecified organism: Secondary | ICD-10-CM

## 2020-01-11 DIAGNOSIS — E785 Hyperlipidemia, unspecified: Secondary | ICD-10-CM

## 2020-01-11 DIAGNOSIS — K929 Disease of digestive system, unspecified: Secondary | ICD-10-CM

## 2020-01-11 DIAGNOSIS — G5603 Carpal tunnel syndrome, bilateral upper limbs: Secondary | ICD-10-CM

## 2020-01-11 DIAGNOSIS — C7A090 Malignant carcinoid tumor of the bronchus and lung: Secondary | ICD-10-CM

## 2020-01-11 DIAGNOSIS — I10 Essential (primary) hypertension: Secondary | ICD-10-CM

## 2020-01-11 DIAGNOSIS — Z86010 Personal history of colonic polyps: Secondary | ICD-10-CM

## 2020-01-11 DIAGNOSIS — E079 Disorder of thyroid, unspecified: Secondary | ICD-10-CM

## 2020-01-11 DIAGNOSIS — R911 Solitary pulmonary nodule: Secondary | ICD-10-CM

## 2020-01-11 DIAGNOSIS — C801 Malignant (primary) neoplasm, unspecified: Secondary | ICD-10-CM

## 2020-02-29 ENCOUNTER — Encounter: Admit: 2020-02-29 | Discharge: 2020-02-29 | Payer: MEDICARE

## 2020-02-29 ENCOUNTER — Ambulatory Visit: Admit: 2020-02-29 | Discharge: 2020-02-29 | Payer: MEDICARE

## 2020-02-29 DIAGNOSIS — C801 Malignant (primary) neoplasm, unspecified: Secondary | ICD-10-CM

## 2020-02-29 DIAGNOSIS — D3A090 Benign carcinoid tumor of the bronchus and lung: Secondary | ICD-10-CM

## 2020-02-29 DIAGNOSIS — E785 Hyperlipidemia, unspecified: Secondary | ICD-10-CM

## 2020-02-29 DIAGNOSIS — M1990 Unspecified osteoarthritis, unspecified site: Secondary | ICD-10-CM

## 2020-02-29 DIAGNOSIS — J189 Pneumonia, unspecified organism: Secondary | ICD-10-CM

## 2020-02-29 DIAGNOSIS — G5603 Carpal tunnel syndrome, bilateral upper limbs: Secondary | ICD-10-CM

## 2020-02-29 DIAGNOSIS — Z8616 History of COVID-19: Secondary | ICD-10-CM

## 2020-02-29 DIAGNOSIS — I10 Essential (primary) hypertension: Secondary | ICD-10-CM

## 2020-02-29 DIAGNOSIS — K929 Disease of digestive system, unspecified: Secondary | ICD-10-CM

## 2020-02-29 DIAGNOSIS — E079 Disorder of thyroid, unspecified: Secondary | ICD-10-CM

## 2020-02-29 DIAGNOSIS — Z86010 Personal history of colonic polyps: Secondary | ICD-10-CM

## 2020-02-29 DIAGNOSIS — F99 Mental disorder, not otherwise specified: Secondary | ICD-10-CM

## 2020-02-29 DIAGNOSIS — G4733 Obstructive sleep apnea (adult) (pediatric): Secondary | ICD-10-CM

## 2020-02-29 NOTE — Assessment & Plan Note
Download of her unit showed usage for more than 8.5 hours nightly with residual AHI only 1 while on CPAP 12 cm H20 which we will continue.

## 2020-02-29 NOTE — Progress Notes
Subjective:       History of Present Illness  Alicia Fox is a 69 y.o. female.  I last saw her in July 2020.  Since that time she got COVID in 2020.  She was inpatient for 6-7 days but never required intubation just oxygen and steroids.  She was later admitted in March for pericardial effusion which was drained but no malignancy was noted. 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   She thinks she has gained about ten pounds in the last couple weeks.  She has been using Lasix for the last 3 days to get rid of the fluid in her legs.    She smoked for only 20 years but hasn't smoked at all for more than 30 years.    She does not currently take any inhalers and has never used them in the past.  She did use a nebulizer after she got the COVID infection and has used some over the last ten days for her congestion.   She is still having cough and congestion in her household but all have been negative for COVID.  They were also tested for flu which was negative.  She went to urgent care in [place] for testing but no CXR was done.  Her breathing is not good but was doing well until the recent viral syndrome.    She is doing well on her CPAP and now feels she can't sleep without it.  She is using a full face mask.    She wakes up feeling refreshed and denies any issues with usage.  Her current unit is only 70 years old.    Her Epworth is 2/24.       Review of Systems   HENT: Positive for congestion, postnasal drip and rhinorrhea.    Eyes: Positive for photophobia.   Respiratory: Positive for apnea and cough.    Genitourinary: Positive for enuresis.   Musculoskeletal: Positive for back pain and myalgias.   Psychiatric/Behavioral: The patient is nervous/anxious.    All other systems reviewed and are negative.        Objective:         ? acetaminophen (TYLENOL) 325 mg tablet Take two tablets by mouth every 6 hours as needed. For fever or pain.   ? amLODIPine (NORVASC) 2.5 mg tablet TAKE 1 TABLET BY MOUTH TWICE A DAY (Patient taking differently: Take 2.5 mg by mouth daily.)   ? atorvastatin (LIPITOR) 10 mg tablet TAKE 1 TABLET BY MOUTH EVERY DAY   ? cetirizine (ZYRTEC) 10 mg tablet Take 10 mg by mouth daily.   ? cholecalciferol (VITAMIN D-3) 1,000 units tablet Take 1,000 Units by mouth daily.   ? furosemide (LASIX) 40 mg tablet TAKE ONE TABLET BY MOUTH DAILY AS NEEDED.   ? gabapentin (NEURONTIN) 100 mg capsule Take two capsules by mouth twice daily.   ? levothyroxine (SYNTHROID) 88 mcg tablet TAKE 1 TABLET BY MOUTH EVERY DAY IN THE MORNING   ? losartan (COZAAR) 50 mg tablet Take one tablet by mouth twice daily.   ? melatonin 10 mg cap Take 10 mg by mouth daily.   ? pantoprazole DR (PROTONIX) 40 mg tablet TAKE 1 TABLET BY MOUTH TWICE A DAY   ? psyllium seed (with sugar) (FIBER PO) Take 100 % by mouth daily. OTC    ? vit C,E-Zn-coppr-lutein-zeaxan (PRESERVISION AREDS-2) 250-200-40-1 mg-unit-mg-mg cap Take 1 capsule by mouth twice daily.     Vitals:  02/29/20 0856 02/29/20 0859   BP: (!) 168/97 (!) 173/76   BP Source: Arm, Left Upper Arm, Right Upper   Pulse: 98    Resp: 16    Temp: 37 ?C (98.6 ?F)    TempSrc: Oral    SpO2: 98%    Weight: 105.7 kg (233 lb)    Height: 162.6 cm (64)    PainSc: Zero      Body mass index is 39.99 kg/m?Marland Kitchen     Physical Exam  Vitals and nursing note reviewed.   Constitutional:       Appearance: She is well-developed.   HENT:      Head: Normocephalic and atraumatic.      Nose: Nose normal.      Mouth/Throat:      Mouth: Mucous membranes are moist.      Pharynx: Oropharynx is clear.   Eyes:      Conjunctiva/sclera: Conjunctivae normal.   Cardiovascular:      Rate and Rhythm: Normal rate and regular rhythm.      Heart sounds: Normal heart sounds.   Pulmonary:      Effort: Pulmonary effort is normal.      Breath sounds: Normal breath sounds.   Musculoskeletal:      Cervical back: Normal range of motion.      Right lower leg: Edema present.      Left lower leg: Edema present. Skin:     General: Skin is warm and dry.   Neurological:      General: No focal deficit present.      Mental Status: She is alert and oriented to person, place, and time. Mental status is at baseline.   Psychiatric:         Mood and Affect: Mood normal.         Behavior: Behavior normal.         Thought Content: Thought content normal.         Judgment: Judgment normal.              Assessment and Plan:    Problem   History of Covid-19    She developed COVID in 12/20 with week long hospitalization and need for oxygen/steroids but not sent home on oxygen.  She apparently had pericardial effusion also as complication but no recurrence since single pericardiocentesis in March 2021.      Osa (Obstructive Sleep Apnea)    Split night Pap titration dated 10/21/2017 at the [facility] showed severe obstructive sleep apnea with AHI of 105.6 events per hour successful CPAP titration 12 cmH2O using full facemask elevated frequency of.  Limb movement with index of 30.8 events per hour and nocturnal hypoxemia with oxygen desaturation nadir of 77% oxygen saturation below 88% for about 99.1 minutes      CPAP compliance for the past 1 month showed 100% usage for more than 4 hours average daily use of 4 hours and 24 minutes CPAP of 12 residual AHI 0.5 events per hour     Carcinoid Tumor of Left Lung    On 03/21/17 she underwent a?Robotic assisted left lower lobectomy under the direction of Dr. Particia Nearing. ?Pathology impression: ?Lung: Typical carcinoid tumor. ??Greatest dimension (centimeters): 3.0 cm.?Additional dimensions (centimeters): 2.5 x 2.5 cm.??Pathologic Stage Classification (pTNM, AJCC 8th Edition): pT1c N0?     Weight Loss (Resolved)   Dyspnea On Exertion (Resolved)       History of COVID-19  I will send her for repeat CXR today  which did no show any acute pulmonary process.  She will keep follow up with cardiology regarding her leg edema and persistent dyspnea.     OSA (obstructive sleep apnea)  Download of her unit showed usage for more than 8.5 hours nightly with residual AHI only 1 while on CPAP 12 cm H20 which we will continue.     Carcinoid tumor of left lung  Chest CT from July 2021 showed stable tiny subpleural right lower lobe nodule which had previously shown slight enlargement. This may be intrapulmonary lymph node. Follow-up chest CT in 6-12 months is recommended to assess for continued stability   given reported history of malignant carcinoid tumor.   Repeat scan is scheduled for January 2022 with follow up with CTS.

## 2020-02-29 NOTE — Patient Instructions
My nurse is Sarah Orwa, RN. She can be reached at [phone].    Please contact my nurse with any questions regarding your appointment.    If you need to schedule or reschedule an appointment, please contact [phone].    For refills on medications, please have your pharmacy fax a refill authorization request form to our office at [phone]. Please allow at least 3 business days for refill requests.    For urgent issues after business hours/weekends/holidays call [phone] and request for the pulmonary fellow to be paged.

## 2020-02-29 NOTE — Assessment & Plan Note
Chest CT from July 2021 showed stable tiny subpleural right lower lobe nodule which had previously shown slight enlargement. This may be intrapulmonary lymph node. Follow-up chest CT in 6-12 months is recommended to assess for continued stability   given reported history of malignant carcinoid tumor.   Repeat scan is scheduled for January 2022 with follow up with CTS.

## 2020-03-10 NOTE — Progress Notes
Date of Service: 03/14/2020       Subjective:             Alicia Fox is a 70 y.o. female.      History of Present Illness  Alicia Fox presents to thoracic surgery clinic for follow up CT scan of her chest for 6 month cancer surveillance. She presented to thoracic surgery for evaluation of?biopsy-proven left lower lobe carcinoid.??On 03/21/17 she underwent a?Robotic assisted left lower lobectomy under the direction of Dr. Particia Nearing. ?Pathology impression: ?Lung: Typical carcinoid tumor. ??Greatest dimension (centimeters): 3.0 cm.?Additional dimensions (centimeters): 2.5 x 2.5 cm.??Pathologic Stage Classification (pTNM, AJCC 8th Edition): pT1c N0?  ?  Last CT chest 09/07/19  1. ?Stable tiny subpleural right lower lobe nodule which had previously shown slight enlargement. This may be intrapulmonary lymph node. Follow-up chest CT in 6-12 months is recommended to assess for continued stability given reported history of malignant carcinoid tumor.   2. ?Left lower lobectomy changes without evidence of recurrent or residual left lung nodule.   3. ?At least moderate coronary artery calcification. Pericardial effusion has nearly resolved.   ?  Hospitalized 05/11/19 for pericardial effusion, 02/15/19 Pneumonia due to COVID-19    Today she reports no procedures, hospitalizations, or problems since our last visit. She has a new cough x 1 month, no hemoptysis. Obtained chest xray through pulmonologist in December which was normal. She lost an intentional 60-lbs since last March, has gained some back recently. She restricted sodium in her diet. She is doing fairly well overall.     CT C/A/P today demonstrates:  CHEST:   No new/enlarging pulmonary nodule or thoracic lymphadenopathy.     ABDOMEN AND PELVIS:   1. No abdominopelvic mass or lymphadenopathy.   2. Cholelithiasis and nonobstructive right nephrolithiasis.         Medical History:   Diagnosis Date   ? Arthritis     lumbar, knees   ? Cancer [facility]) breast   ? Carpal tunnel syndrome on both sides 2018    L > R   ? Disorder of thyroid gland     hypothyroid   ? Gastrointestinal disorder     gerd at times-under control with meds   ? HTN (hypertension)    ? Hx of adenomatous colonic polyps    ? Hyperlipidemia    ? Pneumonia     bilateral bacterial   ? Psychiatric illness     depression     Surgical History:   Procedure Laterality Date   ? HX MASTECTOMY Bilateral 1986    1986-stomberg mastectomy   ? BREAST BIOPSY  07/1984   ? MASTECTOMY, PARTIAL Bilateral 12/1984   ? MASTECTOMY, MODIFIED RADICAL Bilateral 1987    1987-full modified   ? BREAST SURGERY Bilateral 07/1985    implants   ? HX DILATION AND CURETTAGE  07/1994   ? HX HYSTERECTOMY  10/1994    complete   ? HX ABDOMINOPLASTY  10/1994   ? HIP SURGERY  02/1995   ? HX LIPOMA RESECTION  11/1999    outer upper left arm   ? ANUS SURGERY  01/2010    anal fissure   ? HX LIPOMA RESECTION  02/2010    inside upper right arm   ? LEFT DECOMPRESSION NERVE MEDIAN WITH CARPAL TUNNEL RELEASE Left 10/17/2016    Performed by Delice Lesch, MD at [facility] OR   ? BRONCHOSCOPY N/A 01/13/2017    Performed by Audie Box, MD at [facility]  OR   ? BRONCHOSCOPY WITH TRANSBRONCHIAL BIOPSY N/A 01/13/2017    Performed by Audie Box, MD at [facility] OR   ? BRONCHOSCOPY WITH ENDOBRONCHIAL ULTRASOUND GUIDED TRANSTRACHEAL/ TRANSBRONCHIAL SAMPLING - 3 OR MORE MEDIASTINAL/ HILAR LYMPH NODE STATIONS/ STRUCTURE - RIGID N/A 01/13/2017    Performed by Audie Box, MD at [facility] OR   ? BRONCHOSCOPY WITH IMAGE - GUIDED NAVIGATION - FLEXIBLE N/A 01/13/2017    Performed by Audie Box, MD at [facility] OR   ? ROBOTIC LEFT LOWER LOBECTOMY Left 03/21/2017    Performed by Particia Nearing, MD at [facility] CVOR   ? Colonoscopy N/A 10/01/2019    Performed by Meyer Cory, MD at [facility] ENDO   ? ESOPHAGOGASTRODUODENOSCOPY WITH SPECIMEN COLLECTION BY BRUSHING/ WASHING N/A 10/01/2019    Performed by Meyer Cory, MD at [facility] ENDO   ? ESOPHAGOGASTRODUODENOSCOPY WITH SNARE REMOVAL TUMOR/ POLYP/ OTHER LESION - FLEXIBLE N/A 10/01/2019    Performed by Meyer Cory, MD at [facility] ENDO   ? ESOPHAGOGASTRODUODENOSCOPY WITH BIOPSY - FLEXIBLE N/A 10/01/2019    Performed by Meyer Cory, MD at [facility] ENDO   ? COLONOSCOPY WITH SNARE REMOVAL TUMOR/ POLYP/ OTHER LESION  10/01/2019    Performed by Meyer Cory, MD at [facility] ENDO   ? SPINE SURGERY  1993 and 1996    cervical fusion     Allergies   Allergen Reactions   ? Codeine HEADACHE and ITCHING     Social History     Socioeconomic History   ? Marital status: Widowed     Spouse name: Not on file   ? Number of children: Not on file   ? Years of education: Not on file   ? Highest education level: Not on file   Occupational History   ? Occupation: retired   Tobacco Use   ? Smoking status: Former Smoker     Quit date: 03/04/1989     Years since quitting: 31.0   ? Smokeless tobacco: Never Used   ? Tobacco comment: quit in 1991   Substance and Sexual Activity   ? Alcohol use: Not Currently   ? Drug use: No   ? Sexual activity: Not on file   Other Topics Concern   ? Not on file   Social History Narrative   ? Not on file     Family History   Problem Relation Age of Onset   ? Complete Hysterectomy Mother 45        uterine prolaps   ? Hypertension Mother    ? Atrial Fibrillation Mother    ? Hypertension Father    ? COPD Father    ? Heart Failure Father    ? Kidney Cancer Brother 64   ? Cancer-Prostate Brother 60   ? Intellectual Disability Brother    ? Heart Attack Paternal Uncle    ? Cancer-Hematologic Maternal Grandfather         leukemia   ? Cancer-Breast Paternal Grandmother 70   ? Cancer-Breast Maternal Aunt 84         ROS  All other systems reviewed and are negative.  ?  ?    Objective:         ? acetaminophen (TYLENOL) 325 mg tablet Take two tablets by mouth every 6 hours as needed. For fever or pain.   ? amLODIPine (NORVASC) 2.5 mg tablet TAKE 1 TABLET BY MOUTH TWICE A DAY (Patient taking differently: Take 2.5 mg by mouth daily.)   ? atorvastatin (LIPITOR) 10 mg tablet TAKE  1 TABLET BY MOUTH EVERY DAY   ? cetirizine (ZYRTEC) 10 mg tablet Take 10 mg by mouth daily.   ? cholecalciferol (VITAMIN D-3) 1,000 units tablet Take 1,000 Units by mouth daily.   ? furosemide (LASIX) 40 mg tablet TAKE ONE TABLET BY MOUTH DAILY AS NEEDED.   ? gabapentin (NEURONTIN) 100 mg capsule Take two capsules by mouth twice daily.   ? levothyroxine (SYNTHROID) 88 mcg tablet TAKE 1 TABLET BY MOUTH EVERY DAY IN THE MORNING   ? losartan (COZAAR) 50 mg tablet Take one tablet by mouth twice daily.   ? melatonin 10 mg cap Take 10 mg by mouth daily.   ? pantoprazole DR (PROTONIX) 40 mg tablet TAKE 1 TABLET BY MOUTH TWICE A DAY   ? psyllium seed (with sugar) (FIBER PO) Take 100 % by mouth daily. OTC    ? vit C,E-Zn-coppr-lutein-zeaxan (PRESERVISION AREDS-2) 250-200-40-1 mg-unit-mg-mg cap Take 1 capsule by mouth twice daily.     Vitals:    03/14/20 1029   BP: 130/78   BP Source: Arm, Left Upper   Patient Position: Sitting   Temp: 36.4 ?C (97.5 ?F)   TempSrc: Temporal   Weight: 107.1 kg (236 lb 3.2 oz)   Height: 1.626 m (5' 4)   PainSc: Zero     Body mass index is 40.54 kg/m?Marland Kitchen     Physical Exam  Vitals reviewed.   Constitutional:       Appearance: She is well-developed.   HENT:      Head: Normocephalic.      Nose: Nose normal.   Eyes:      Conjunctiva/sclera: Conjunctivae normal.   Cardiovascular:      Rate and Rhythm: Normal rate and regular rhythm.      Heart sounds: Normal heart sounds.   Pulmonary:      Effort: Pulmonary effort is normal.      Breath sounds: Normal breath sounds.   Musculoskeletal:         General: Normal range of motion.      Cervical back: Neck supple.   Skin:     General: Skin is warm and dry.   Neurological:      Mental Status: She is alert and oriented to person, place, and time.   Psychiatric:         Behavior: Behavior normal.         Thought Content: Thought content normal.         Judgment: Judgment normal.              Assessment and Plan:  1. Carcinoid tumor of left lung  CT CHEST W CONTRAST       1. Proceed with 1 year follow up for continued surveillance with a ct chest w contrast.     Micheal Likens, APRN  Thoracic Surgery and Lung Cancer Screening    Total Time Today was 30 minutes in the following activities: Preparing to see the patient, Performing a medically appropriate examination and/or evaluation, Independently interpreting results (not separately reported) and communicating results to the patient/family/caregiver and Care coordination (not separately reported)              ATTESTATION     I personally interviewed and examined the patient.  I have reviewed the history, physical, impression and plan outlined by the Nurse Practitioner.     I had the pleasure of seeing Ms. Westley today via telehealth in thoracic surgery clinic.  As you are aware she is a  70 year old female who underwent a robotic assisted left lower lobectomy for T1CN0 typical carcinoid on March 21, 2017.  She was originally supposed to be seen in July 2021 but had multiple issues over the past year.  In December 2020 she suffered from Covid.  In March 2021 she had a significantly large pericardial effusion which was drained and was found to be nonmalignant.  However there is been a significant recovery issue with her.  Based on the CT scan in July 2021 there was no evidence of recurrence of disease however there was a tiny subpleural right lower lobe nodule which had previously shown slight enlargement.  We had had a telehealth visit with her in November of last year.  Given the finding on the July 2021 scan we elected to bring her back for clinic visit this January for 80-month evaluation. Otherwise she denies any hemoptysis, bone pain, headaches or other constitutional symptoms.  She has intentionally lost approximately 60 pounds.    Physical exam she is 5 feet 4 inches 107.1 kg giving her BMI 40.5.  Remainder physical exam per nurse practitioner.    CT scan of the chest abdomen pelvis was performed today.  Evaluation by radiology still pending.  Preliminary review of the CT of the chest I do not see any evidence of metastatic disease or recurrent disease.  Based upon the results of her CT scan we will elect to see her back in 6 or 12 months.  I appreciate the opportunity participate in her care.  Please feel free to contact our office any questions or concerns.    Staff name: Oleh Genin. Ignacia Palma, MD

## 2020-03-14 NOTE — Patient Instructions
If you have any questions, please contact Heather, Kayla, or Fumiko Cham at [phone] (M-F 8a-4:30p)  After hours, you may call [phone] (Evenings/Weekends/Holidays)

## 2020-03-16 ENCOUNTER — Encounter: Admit: 2020-03-16 | Discharge: 2020-03-16 | Payer: MEDICARE

## 2020-03-28 ENCOUNTER — Encounter: Admit: 2020-03-28 | Discharge: 2020-03-28 | Payer: MEDICARE

## 2020-03-28 MED ORDER — ATORVASTATIN 10 MG PO TAB
ORAL_TABLET | Freq: Every day | 1 refills | Status: AC
Start: 2020-03-28 — End: ?

## 2020-04-02 ENCOUNTER — Encounter: Admit: 2020-04-02 | Discharge: 2020-04-02 | Payer: MEDICARE

## 2020-04-02 MED ORDER — GABAPENTIN 100 MG PO CAP
ORAL_CAPSULE | Freq: Two times a day (BID) | 1 refills
Start: 2020-04-02 — End: ?

## 2020-05-05 ENCOUNTER — Encounter: Admit: 2020-05-05 | Discharge: 2020-05-05 | Payer: MEDICARE

## 2020-05-05 MED ORDER — PANTOPRAZOLE 40 MG PO TBEC
ORAL_TABLET | Freq: Two times a day (BID) | 1 refills
Start: 2020-05-05 — End: ?

## 2020-05-05 MED ORDER — GABAPENTIN 100 MG PO CAP
ORAL_CAPSULE | Freq: Two times a day (BID) | 0 refills
Start: 2020-05-05 — End: ?

## 2020-05-05 MED ORDER — LOSARTAN 50 MG PO TAB
ORAL_TABLET | Freq: Two times a day (BID) | ORAL | 3 refills | 30.00000 days | Status: AC
Start: 2020-05-05 — End: ?

## 2020-05-05 MED ORDER — LEVOTHYROXINE 88 MCG PO TAB
ORAL_TABLET | Freq: Every day | 1 refills
Start: 2020-05-05 — End: ?

## 2020-05-23 ENCOUNTER — Encounter: Admit: 2020-05-23 | Discharge: 2020-05-23 | Payer: MEDICARE

## 2020-05-25 ENCOUNTER — Ambulatory Visit: Admit: 2020-05-25 | Discharge: 2020-05-25 | Payer: MEDICARE

## 2020-05-25 ENCOUNTER — Encounter: Admit: 2020-05-25 | Discharge: 2020-05-25 | Payer: MEDICARE

## 2020-05-25 DIAGNOSIS — C7A090 Malignant carcinoid tumor of the bronchus and lung: Secondary | ICD-10-CM

## 2020-05-25 DIAGNOSIS — F331 Major depressive disorder, recurrent, moderate: Secondary | ICD-10-CM

## 2020-05-25 DIAGNOSIS — R739 Hyperglycemia, unspecified: Principal | ICD-10-CM

## 2020-05-25 DIAGNOSIS — K929 Disease of digestive system, unspecified: Secondary | ICD-10-CM

## 2020-05-25 DIAGNOSIS — Z6841 Body Mass Index (BMI) 40.0 and over, adult: Secondary | ICD-10-CM

## 2020-05-25 DIAGNOSIS — F99 Mental disorder, not otherwise specified: Secondary | ICD-10-CM

## 2020-05-25 DIAGNOSIS — I5032 Chronic diastolic (congestive) heart failure: Secondary | ICD-10-CM

## 2020-05-25 DIAGNOSIS — E785 Hyperlipidemia, unspecified: Secondary | ICD-10-CM

## 2020-05-25 DIAGNOSIS — E079 Disorder of thyroid, unspecified: Secondary | ICD-10-CM

## 2020-05-25 DIAGNOSIS — N3946 Mixed incontinence: Secondary | ICD-10-CM

## 2020-05-25 DIAGNOSIS — C801 Malignant (primary) neoplasm, unspecified: Secondary | ICD-10-CM

## 2020-05-25 DIAGNOSIS — I10 Essential (primary) hypertension: Secondary | ICD-10-CM

## 2020-05-25 DIAGNOSIS — M1990 Unspecified osteoarthritis, unspecified site: Secondary | ICD-10-CM

## 2020-05-25 DIAGNOSIS — G5603 Carpal tunnel syndrome, bilateral upper limbs: Secondary | ICD-10-CM

## 2020-05-25 DIAGNOSIS — J189 Pneumonia, unspecified organism: Secondary | ICD-10-CM

## 2020-05-25 DIAGNOSIS — Z86010 Personal history of colonic polyps: Secondary | ICD-10-CM

## 2020-05-25 LAB — COMPREHENSIVE METABOLIC PANEL
Lab: 0.4 mg/dL (ref 0.4–1.00)
Lab: 105 mg/dL — ABNORMAL HIGH (ref 70–100)
Lab: 146 MMOL/L (ref 137–147)
Lab: 18 mg/dL (ref 7–25)
Lab: 4.1 MMOL/L (ref 3.5–5.1)

## 2020-05-25 LAB — TSH WITH FREE T4 REFLEX: Lab: 1.1 uU/mL (ref 0.35–5.00)

## 2020-05-25 MED ORDER — OZEMPIC 0.25 MG OR 0.5 MG(2 MG/1.5 ML) SC PNIJ
.25 mg | SUBCUTANEOUS | 0 refills | Status: AC
Start: 2020-05-25 — End: ?

## 2020-05-25 MED ORDER — OXYBUTYNIN CHLORIDE 10 MG PO TR24
10 mg | ORAL_TABLET | Freq: Every day | ORAL | 1 refills | 12.00000 days | Status: AC
Start: 2020-05-25 — End: ?

## 2020-05-25 MED ORDER — GABAPENTIN 100 MG PO CAP
200 mg | ORAL_CAPSULE | Freq: Two times a day (BID) | ORAL | 1 refills | Status: AC
Start: 2020-05-25 — End: ?

## 2020-05-25 NOTE — Patient Instructions
Labs today.    You will be contacted for appointments with weight management, dietician, urology and social work.    Please start ozempic weekly for weight loss, if not expensive.  Call if prescription is too expensive

## 2020-05-26 ENCOUNTER — Encounter: Admit: 2020-05-26 | Discharge: 2020-05-26 | Payer: MEDICARE

## 2020-05-31 ENCOUNTER — Encounter: Admit: 2020-05-31 | Discharge: 2020-05-31 | Payer: MEDICARE

## 2020-05-31 NOTE — Progress Notes
Reached out to pt in response to ASW referral pertaining to [facility] needs/grief.  Pt welcomes the call, very pleasant however is busy and cant engage for too long.  Coordinated for [facility] to send mychart message with brief education on [facility] program and for pt to call back, reply via mychart and/or schedule an apt.  Pt is appreciative of a call.

## 2020-06-01 ENCOUNTER — Encounter: Admit: 2020-06-01 | Discharge: 2020-06-01 | Payer: MEDICARE

## 2020-06-01 DIAGNOSIS — R059 Cough: Secondary | ICD-10-CM

## 2020-06-01 DIAGNOSIS — I5032 Chronic diastolic (congestive) heart failure: Secondary | ICD-10-CM

## 2020-06-01 DIAGNOSIS — R7309 Other abnormal glucose: Secondary | ICD-10-CM

## 2020-06-01 DIAGNOSIS — F99 Mental disorder, not otherwise specified: Secondary | ICD-10-CM

## 2020-06-01 DIAGNOSIS — J189 Pneumonia, unspecified organism: Secondary | ICD-10-CM

## 2020-06-01 DIAGNOSIS — I10 Essential (primary) hypertension: Secondary | ICD-10-CM

## 2020-06-01 DIAGNOSIS — I313 Pericardial effusion (noninflammatory): Secondary | ICD-10-CM

## 2020-06-01 DIAGNOSIS — C801 Malignant (primary) neoplasm, unspecified: Secondary | ICD-10-CM

## 2020-06-01 DIAGNOSIS — M1990 Unspecified osteoarthritis, unspecified site: Secondary | ICD-10-CM

## 2020-06-01 DIAGNOSIS — K929 Disease of digestive system, unspecified: Secondary | ICD-10-CM

## 2020-06-01 DIAGNOSIS — T887XXA Unspecified adverse effect of drug or medicament, initial encounter: Secondary | ICD-10-CM

## 2020-06-01 DIAGNOSIS — G5603 Carpal tunnel syndrome, bilateral upper limbs: Secondary | ICD-10-CM

## 2020-06-01 DIAGNOSIS — G4733 Obstructive sleep apnea (adult) (pediatric): Secondary | ICD-10-CM

## 2020-06-01 DIAGNOSIS — E785 Hyperlipidemia, unspecified: Secondary | ICD-10-CM

## 2020-06-01 DIAGNOSIS — Z8616 History of COVID-19: Secondary | ICD-10-CM

## 2020-06-01 DIAGNOSIS — Z86010 Personal history of colonic polyps: Secondary | ICD-10-CM

## 2020-06-01 DIAGNOSIS — R600 Localized edema: Secondary | ICD-10-CM

## 2020-06-01 DIAGNOSIS — E782 Mixed hyperlipidemia: Secondary | ICD-10-CM

## 2020-06-01 DIAGNOSIS — Z9229 Personal history of other drug therapy: Secondary | ICD-10-CM

## 2020-06-01 DIAGNOSIS — E079 Disorder of thyroid, unspecified: Secondary | ICD-10-CM

## 2020-06-01 DIAGNOSIS — U071 COVID-19: Secondary | ICD-10-CM

## 2020-06-01 MED ORDER — CARVEDILOL 3.125 MG PO TAB
3.125 mg | ORAL_TABLET | Freq: Two times a day (BID) | ORAL | 3 refills | 90.00000 days | Status: AC
Start: 2020-06-01 — End: ?

## 2020-06-01 MED ORDER — METFORMIN 500 MG PO TAB
500 mg | ORAL_TABLET | Freq: Two times a day (BID) | ORAL | 0 refills | Status: AC
Start: 2020-06-01 — End: ?

## 2020-06-01 NOTE — Progress Notes
Date of Service: 06/01/2020    Alicia Fox is a 70 y.o. female.       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    In the past few years patient has lost several family members, she states that she is depressed and she feels lonely, she is reluctant to starting any antidepressant medication.  Patient states that this past holiday season was very hard on her.    Recent laboratory work dated 05/25/2020 demonstrated an elevated hemoglobin A1c of 6.3%.  You suggested initiated Ozempic, however patient states that it would have cost approximately $2400 a month and she decided not to proceed.  She did overeat in the past few months and she gained weight.    This patient also has a history of pericardial effusion that was previously detected on an echocardiogram performed on 05/12/2019, at that time patient did undergo pericardiocentesis.  On the repeat studies, the most recently being performed on 07/12/2019 did not demonstrate any recurrence of the pericardial effusion, normal systolic function.    At present time patient does not report any symptoms of chest pain, no heart palpitations, no shortness of breath.    Currently her blood pressure is suboptimally controlled and the pulse was 95 bpm.  Patient is on amlodipine and losartan.  She is reluctant to increasing amlodipine due to the potential of lower extremity edema.  She does take furosemide as needed for lower extremity edema.         Vitals:    06/01/20 1247 06/01/20 1255   BP: (!) 140/68 (!) 148/76   BP Source: Arm, Left Upper Arm, Right Upper   Patient Position: Sitting Sitting   Pulse: 95    SpO2: 99% Weight: 110 kg (242 lb 6.4 oz)    Height: 162.6 cm (5' 4)    PainSc: Zero      Body mass index is 41.61 kg/m?Marland Kitchen     Past Medical History  Patient Active Problem List    Diagnosis Date Noted   ? History of COVID-19 05/13/2019     Priority: Medium     She developed COVID in 12/20 with week long hospitalization and need for oxygen/steroids but not sent home on oxygen.  She apparently had pericardial effusion also as complication but no recurrence since single pericardiocentesis in March 2021.      ? 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 Hooded upper eyelid 08/04/2019   ? COVID-19 vaccine administered 07/15/2019   ? Other headache syndrome 05/28/2019   ? COVID-19 vaccine administered 05/28/2019   ? Pericardial effusion 05/11/2019   ? Shortness of breath with exposure to severe acute respiratory syndrome coronavirus 2 (SARS-CoV-2) 04/05/2019   ? Morbid obesity (HCC) 02/18/2019   ? Chest wall pain 02/18/2019   ? Pneumonia due to COVID-19 virus 02/15/2019   ? COVID-19 02/05/2019   ? OSA (obstructive sleep apnea) 02/17/2018     Split night Pap titration dated 10/21/2017 at the [facility] showed severe obstructive sleep apnea with AHI of 105.6 events per hour successful CPAP titration 12 cmH2O using full facemask elevated frequency of.  Limb movement with index of 30.8 events per hour  and nocturnal hypoxemia with oxygen desaturation nadir of 77% oxygen saturation below 88% for about 99.1 minutes      CPAP compliance for the past 1 month showed 100% usage for more than 4 hours average daily use of 4 hours and 24 minutes CPAP of 12 residual AHI 0.5 events per hour     ? 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 Particia Nearing. ?Pathology impression: ?Lung: Typical carcinoid tumor. ??Greatest dimension (centimeters): 3.0 cm.?Additional dimensions (centimeters): 2.5 x 2.5 cm.??Pathologic Stage Classification (pTNM, AJCC 8th Edition): pT1c N0?     ? Carpal tunnel syndrome of right wrist 10/22/2016   ? Carpal tunnel syndrome of left wrist 10/09/2016     Added automatically from request for surgery [id number]     ? 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 Hypercalcemia 08/09/2016   ? Carpal tunnel syndrome, bilateral 08/06/2016   ? Abnormal CT of the head 08/06/2016   ? Cough 08/09/2014   ? Framingham cardiac risk <10% in next 10 years 08/09/2014   ? Medication side effects 05/18/2014   ? Bilateral edema of lower extremity 04/28/2014   ? Diastolic heart failure (HCC) 04/28/2014   ? Hyperlipemia 01/18/2010   ? Hypertension 07/21/2008   ? 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 Obesity 07/21/2008         Review of Systems   Constitutional: Positive for malaise/fatigue and weight gain.   HENT: Negative.    Eyes: Positive for blurred vision.   Cardiovascular: Positive for dyspnea on exertion and leg swelling.   Respiratory: Positive for cough.    Endocrine: Negative.    Hematologic/Lymphatic: Negative.    Skin: Positive for suspicious lesions.   Musculoskeletal: Positive for back pain, muscle cramps and myalgias.   Gastrointestinal: Negative.    Genitourinary: Negative.    Neurological: Negative.    Psychiatric/Behavioral: Positive for depression.   Allergic/Immunologic: Negative.        Physical Exam  General Appearance: obese  Skin: warm, moist, no ulcers or xanthomas  Eyes: conjunctivae and lids normal, pupils are equal and round  Lips & Oral Mucosa: no pallor or cyanosis  Neck Veins: neck veins are flat, neck veins are not distended  Chest Inspection: chest is normal in appearance  Respiratory Effort: breathing comfortably, no respiratory distress  Auscultation/Percussion: lungs clear to auscultation, no rales or rhonchi, no wheezing  Cardiac Rhythm: regular rhythm and normal rate  Cardiac Auscultation: S1, S2 normal, no rub, no gallop  Murmurs: no murmur  Carotid Arteries: normal carotid upstroke bilaterally, no bruit  Abdominal aorta: could not be examined due to obese adomen  Lower Extremity Edema: no lower extremity edema  Abdominal Exam: soft, non-tender, no masses, bowel sounds normal  Liver & Spleen: no organomegaly  Language and Memory: patient responsive and seems to comprehend information  Neurologic Exam: neurological assessment grossly intact        Cardiovascular Studies  Twelve-lead EKG demonstrates normal sinus rhythm, PACs are present, first-degree AV block, PR interval 228 ms, borderline left axis deviation.    Cardiovascular Health Factors  Vitals BP Readings from Last 3 Encounters:   06/01/20 (!) 148/76   05/25/20 138/82   03/14/20 130/78     Wt Readings from Last 3 Encounters:   06/01/20 110 kg (242 lb 6.4 oz)   05/25/20 111.6 kg (246 lb)   03/14/20 107.1 kg (236 lb  3.2 oz)     BMI Readings from Last 3 Encounters:   06/01/20 41.61 kg/m?   05/25/20 42.23 kg/m?   03/14/20 40.54 kg/m?      Smoking Social History     Tobacco Use   Smoking Status Former Smoker   ? Quit date: 03/04/1989   ? Years since quitting: 31.2   Smokeless Tobacco Never Used   Tobacco Comment    quit in 1991      Lipid Profile Cholesterol   Date Value Ref Range Status   09/02/2019 161 <200 MG/DL Final     HDL   Date Value Ref Range Status   09/02/2019 48 >40 MG/DL Final     LDL   Date Value Ref Range Status   09/02/2019 92 <100 mg/dL Final     Triglycerides   Date Value Ref Range Status   09/02/2019 96 <150 MG/DL Final      Blood Sugar Hemoglobin A1C   Date Value Ref Range Status   05/25/2020 6.3 (H) 4.0 - 6.0 % Final     Comment:     The ADA recommends that most patients with type 1 and type 2 diabetes maintain   an A1c level <7%.       Glucose   Date Value Ref Range Status   05/25/2020 105 (H) 70 - 100 MG/DL Final   98/01/9146 829 (H) 70 - 100 MG/DL Final   56/21/3086 578 (H) 70 - 100 MG/DL Final     Glucose, POC   Date Value Ref Range Status   01/03/2017 138 (H) 70 - 100 MG/DL Final          Problems Addressed Today  Encounter Diagnoses   Name Primary?   ? Primary hypertension Yes   ? Mixed hyperlipidemia    ? Chronic diastolic heart failure (HCC)    ? Pericardial effusion    ? Hypercalcemia    ? History of COVID-19    ? Fen-phen history    ? Class 3 severe obesity due to excess calories without serious comorbidity with body mass index (BMI) of 40.0 to 44.9 in adult [facility])    ? Bilateral edema of lower extremity    ? Medication side effects    ? Cough    ? OSA (obstructive sleep apnea)    ? COVID-19    ? Elevated hemoglobin A1c        Assessment and Plan     In summary: This is a 70 year old white female that presents with the following cardiovascular/medical issues:    1.  Primary hypertension?suboptimally controlled  2.  Borderline tachycardia  3.  Obesity?current BMI is 41.61 kg/m?Marland Kitchen  Last year, March 2021 through May 2021 patient did lose 34 pounds by complete eliminating sodium from her diet.  She currently did gain weight back  4.  History of COVID-19 pneumonia  5.  History of pericardial effusion?patient did undergo pericardiocentesis  6.  History of lung cancer?she continues to undergo surveillance studies  7.  No documented history of CAD  8.  Family history of coronary artery disease  9.  Elevated hemoglobin A1c?Ozempic was not an option due to very high cost    Plan:    1.  Discontinue amlodipine  2.  Start carvedilol 3.125 mg 1 tablet p.o. twice daily  3.  I suggest initiating Metformin, perhaps 500 mg p.o. twice daily with further monitoring of the blood glucose and hemoglobin A1c  4.  Further evaluation with a 2D  echo Doppler study?we will follow up on the results and call the patient with further recommendations  5.  Follow-up office visit in 10 to 12 months  6.  Also recommend further psychiatric evaluation due to symptoms of depression.    Total Time Today was 30 minutes in the following activities: Preparing to see the patient, Obtaining and/or reviewing separately obtained history, Performing a medically appropriate examination and/or evaluation, Counseling and educating the patient/family/caregiver, Ordering medications, tests, or procedures, Referring and communication with other health care professionals (when not separately reported), Documenting clinical information in the electronic or other health record and Independently interpreting results (not separately reported) and communicating results to the patient/family/caregiver         Current Medications (including today's revisions)  ? acetaminophen (TYLENOL) 325 mg tablet Take two tablets by mouth every 6 hours as needed. For fever or pain.   ? amLODIPine (NORVASC) 2.5 mg tablet TAKE 1 TABLET BY MOUTH TWICE A DAY (Patient taking differently: Take 2.5 mg by mouth daily.)   ? ascorbic acid (VITAMIN C) 500 mg tablet Take 500 mg by mouth daily.   ? atorvastatin (LIPITOR) 10 mg tablet TAKE 1 TABLET BY MOUTH EVERY DAY   ? cetirizine (ZYRTEC) 10 mg tablet Take 10 mg by mouth daily.   ? cholecalciferol (VITAMIN D-3) 1,000 units tablet Take 1,000 Units by mouth daily.   ? fexofenadine HCl (MUCINEX ALLERGY PO) Take 1 tablet by mouth daily.   ? furosemide (LASIX) 40 mg tablet TAKE ONE TABLET BY MOUTH DAILY AS NEEDED.   ? gabapentin (NEURONTIN) 100 mg capsule Take two capsules by mouth twice daily.   ? levothyroxine (SYNTHROID) 88 mcg tablet TAKE 1 TABLET BY MOUTH EVERY DAY IN THE MORNING   ? losartan (COZAAR) 50 mg tablet TAKE 1 TABLET BY MOUTH TWICE A DAY   ? melatonin 10 mg cap Take 10 mg by mouth daily.   ? oxybutynin XL (DITROPAN XL) 10 mg tablet Take one tablet by mouth daily.   ? pantoprazole DR (PROTONIX) 40 mg tablet TAKE 1 TABLET BY MOUTH TWICE A DAY   ? polycarbophil (FIBERCON) 625 mg tablet Take 625 mg by mouth twice daily.   ? Psyllium Husk (FIBER-CAPS (PSYLLIUM HUSK)) 0.52 gram cap Take 1 capsule by mouth twice daily.   ? QUERCETIN DIHYDRATE (BULK) MISC Take 1 tablet by mouth daily.   ? simethicone (MYLICON) 80 mg chew tablet as Needed.   ? vit C,E-Zn-coppr-lutein-zeaxan (PRESERVISION AREDS-2) 250-200-40-1 mg-unit-mg-mg cap Take 1 capsule by mouth twice daily.   ? Zinc 50 mg tab Take 1 tablet by mouth daily.

## 2020-06-01 NOTE — Patient Instructions
Thank you for visiting our office today.    We would like to make the following medication adjustments:      Stop taking amlodipine.  Start taking Carvedilol 3.125 twice daily.      Otherwise continue the same medications as you have been doing.          We will be pursuing the following tests after your appointment today:       Orders Placed This Encounter    ECG Today (all locations)    2D + DOPPLER ECHO    carvediloL (COREG) 3.125 mg tablet         We will plan to see you back in 10-12 months.  Please call us in the meantime with any questions or concerns.        Please allow 5-7 business days for our providers to review your results. All normal results will go to MyChart. If you do not have Mychart, it is strongly recommended to get this so you can easily view all your results. If you do not have mychart, we will attempt to call you once with normal lab and testing results. If we cannot reach you by phone with normal results, we will send you a letter.  If you have not heard the results of your testing after one week please give [place] a call.       Your Cardiovascular Medicine [place] Team [place], Rene Kocher and [address] [facility])  phone number is [phone].

## 2020-06-06 ENCOUNTER — Encounter: Admit: 2020-06-06 | Discharge: 2020-06-06 | Payer: MEDICARE

## 2020-06-06 NOTE — Telephone Encounter
Received referral with a request to schedule nutrition counseling for this patient. Called patient and left message to schedule appointment. Contact information provided. MyChart message sent.  Shakeya Kerkman, MS RD LD  Voicemail Line [phone]

## 2020-06-13 ENCOUNTER — Encounter: Admit: 2020-06-13 | Discharge: 2020-06-13 | Payer: MEDICARE

## 2020-06-19 ENCOUNTER — Encounter: Admit: 2020-06-19 | Discharge: 2020-06-19 | Payer: MEDICARE

## 2020-06-26 ENCOUNTER — Ambulatory Visit: Admit: 2020-06-26 | Discharge: 2020-06-27 | Payer: MEDICARE

## 2020-06-26 ENCOUNTER — Encounter: Admit: 2020-06-26 | Discharge: 2020-06-26 | Payer: MEDICARE

## 2020-07-05 ENCOUNTER — Encounter: Admit: 2020-07-05 | Discharge: 2020-07-05 | Payer: MEDICARE

## 2020-07-05 ENCOUNTER — Ambulatory Visit: Admit: 2020-07-05 | Discharge: 2020-07-05 | Payer: MEDICARE

## 2020-07-05 DIAGNOSIS — I10 Essential (primary) hypertension: Secondary | ICD-10-CM

## 2020-07-05 DIAGNOSIS — I313 Pericardial effusion (noninflammatory): Secondary | ICD-10-CM

## 2020-07-05 MED ORDER — PERFLUTREN LIPID MICROSPHERES 1.1 MG/ML IV SUSP
1-10 mL | Freq: Once | INTRAVENOUS | 0 refills | Status: CP | PRN
Start: 2020-07-05 — End: ?

## 2020-07-06 ENCOUNTER — Encounter: Admit: 2020-07-06 | Discharge: 2020-07-06 | Payer: MEDICARE

## 2020-07-06 NOTE — Telephone Encounter
-----   Message from Vertell Novak, MD sent at 07/06/2020 12:52 PM CDT -----  Please let the patient know that the echocardiogram was overall unremarkable, normal heart function.  She can follow-up with me in 1 year from the last office visit.  Please change her appointment to reflect 1 year office visit.Thank you  ----- Message -----  From: Vertell Novak, MD  Sent: 07/05/2020   2:20 PM CDT  To: Vertell Novak, MD

## 2020-07-06 NOTE — Telephone Encounter
Results and recommendations called to patient. Patient has no questions at this time.

## 2020-07-12 ENCOUNTER — Encounter: Admit: 2020-07-12 | Discharge: 2020-07-12 | Payer: MEDICARE

## 2020-08-09 ENCOUNTER — Encounter: Admit: 2020-08-09 | Discharge: 2020-08-09 | Payer: MEDICARE

## 2020-08-13 ENCOUNTER — Encounter: Admit: 2020-08-13 | Discharge: 2020-08-13 | Payer: MEDICARE

## 2020-08-13 MED ORDER — METFORMIN 500 MG PO TAB
ORAL_TABLET | Freq: Every day | 0 refills
Start: 2020-08-13 — End: ?

## 2020-08-14 ENCOUNTER — Encounter: Admit: 2020-08-14 | Discharge: 2020-08-14 | Payer: MEDICARE

## 2020-08-14 NOTE — Patient Instructions
-   If you need to schedule or reschedule an appointment, please call [phone].     - For any urgent issues after business hours, please call [phone] and have the on call pulmonary physician paged.    - If you have any questions regarding your appointment or any concerns, please contact my pulmonary care nurse at  [phone], or send me a mychart message    -Refills on medications, please have your pharmacy fax a refill authorization request form to our office at Fax) [phone].     Please allow at least 3 business days for refill requests.

## 2020-08-15 ENCOUNTER — Encounter: Admit: 2020-08-15 | Discharge: 2020-08-15 | Payer: MEDICARE

## 2020-08-15 ENCOUNTER — Ambulatory Visit: Admit: 2020-08-15 | Discharge: 2020-08-15 | Payer: MEDICARE

## 2020-08-15 NOTE — Progress Notes
Clinical Nutrition Assessment Summary - New Pt    Name: Alicia Fox           MRN: [id number]                 DOB: 07-12-1950          Age: 70 y.o.  Date of Service: 08/15/2020     Alicia Fox is a 70 y.o. female Referred by: [phone] [facility] Larina Bras, MD  Reason for Nutrition Referral: Weight management   This visit was conducted in person.     Nutrition-Related Problem Review:  Dyslipidemia: atorvastatin, psyllium   Lab Results   Component Value Date/Time    CHOL 161 09/02/2019 11:20 AM    TRIG 96 09/02/2019 11:20 AM    HDL 48 09/02/2019 11:20 AM    LDL 92 09/02/2019 11:20 AM     Hypertension: amlodipine, losartan, furosemide  BP Readings from Last 3 Encounters:   07/05/20 (!) 176/82   06/01/20 (!) 148/76   05/25/20 138/82     Lab Results   Component Value Date/Time    K 4.1 05/25/2020 03:24 PM     Hyperglycemia: metformin   Lab Results   Component Value Date/Time    HGBA1C 6.3 (H) 05/25/2020 03:24 PM     Nutrition Update:  Patient is not watching sodium as closely as she was previously, though continues to use low sodium products. A1C is elevated, has not been watching carbs.    Goal: keep food journal to monitor sodium and carbohydrate.  Movement is very limited due to pain.    Goal: utilize therapy pool at [facility], patient will consider.  3 eggs, whole wheat toast for breakfast   Goal: add high fiber fruit to this meal  Tuna and rice for lunch   Goal: add non-starchy vegetables to this meal  Larger meal sometime between 430-7 and then feels very hungry in evening and will continue to eat. Eats quickly, feels fullness, but does not always stop eating. Some emotionally-driven eating. Is meeting with SW to address life stressors.   Goal: balance plate for dinner meal and bulk up intake earlier in the day to reduce evening eating; utilize HALT method and mindfulness when eating. Continue with SW.  Water intake a bit low lately. Coffee with cream and sugar in AM. Does not drink alcohol.   Goal: 64 oz water per day; reduce cream and sugar in coffee    Nutrition Diagnosis  Excessive energy intake related to food and nutrition-related knowledge deficit as evidenced by diet history.    Nutrition Prescription  This patient would benefit from a low sodium, hypocaloric diet to promote a healthy lifestyle and stabilize lab values.    -Caloric intake of 2875 kcal/day (25kcal/kg ABW 115kg) for weight maintenance, 2300 kcal per day for weight loss   -Protein intake of 92.8-116 g/day (0.8-1 g/kg ABW)  -Fat intake of 25-35% total kcal   -total saturated fat intake of <7% total kcal    -Sodium 1800mg /day    -Calcium 1200 mg/day for females age 11+   -Iron 8 mg/day for men aged 74+ and women aged 63+   -Potassium 4700 mg/day for ages 91+     Nutrition Education  Plate Method, Heart Healthy Diet, Low Sodium Diet, Hydration and Physical Activity     Patient Goals  -General Health  -Plate Method (1/2 plate non-starchy vegetables, 1/4 plate from starch, 1/4 plate lean protein; add unsaturated fats)  -Physical  Activity; 121min/week at least, can be 30 minutes a day in 10 minute spurts  -Hydration; 64-80oz fluid per day, mostly from water  -Cardiovascular Health   -No trans fat   -Decrease saturated fat, replace with unsaturated fat   -Reduce sodium, increase potassium/magnesium   -Increase soluble fiber   -Increase omega-3 fatty acids   -Moderate carbohydrate intake   -Read food labels, including ingredient lists   -Limit alcohol, increase exercise    Follow Up:  This patient will continue maintenance nutrition therapy. Next visit: 10/04/2020.  Contact information was provided.

## 2020-08-16 ENCOUNTER — Encounter: Admit: 2020-08-16 | Discharge: 2020-08-16 | Payer: MEDICARE

## 2020-08-16 ENCOUNTER — Ambulatory Visit: Admit: 2020-08-16 | Discharge: 2020-08-17 | Payer: MEDICARE

## 2020-08-16 DIAGNOSIS — C801 Malignant (primary) neoplasm, unspecified: Secondary | ICD-10-CM

## 2020-08-16 DIAGNOSIS — E785 Hyperlipidemia, unspecified: Secondary | ICD-10-CM

## 2020-08-16 DIAGNOSIS — I10 Essential (primary) hypertension: Secondary | ICD-10-CM

## 2020-08-16 DIAGNOSIS — Z86010 Personal history of colonic polyps: Secondary | ICD-10-CM

## 2020-08-16 DIAGNOSIS — F99 Mental disorder, not otherwise specified: Secondary | ICD-10-CM

## 2020-08-16 DIAGNOSIS — K929 Disease of digestive system, unspecified: Secondary | ICD-10-CM

## 2020-08-16 DIAGNOSIS — G5603 Carpal tunnel syndrome, bilateral upper limbs: Secondary | ICD-10-CM

## 2020-08-16 DIAGNOSIS — J189 Pneumonia, unspecified organism: Secondary | ICD-10-CM

## 2020-08-16 DIAGNOSIS — G4709 Other insomnia: Secondary | ICD-10-CM

## 2020-08-16 DIAGNOSIS — E079 Disorder of thyroid, unspecified: Secondary | ICD-10-CM

## 2020-08-16 DIAGNOSIS — G4733 Obstructive sleep apnea (adult) (pediatric): Secondary | ICD-10-CM

## 2020-08-16 DIAGNOSIS — M1990 Unspecified osteoarthritis, unspecified site: Secondary | ICD-10-CM

## 2020-08-16 NOTE — Assessment & Plan Note
Reviewed CPAP download. 30/30 days of usage.   Wearing 4 hours or greater 100% of nights. The average use on days used was 8 hours and 26 minutes.  Residual AHI 1.4.    The median leak was 0.3 L/min. She is tolerating and benefiting from therapy.     - Will continue current settings, CPAP 12.     - Keep in contact with DME for routine supplies, need to contact us directly if unable to communicate with DME in a timely manner.

## 2020-08-16 NOTE — Progress Notes
Date of Service: 08/16/2020    Subjective:             Alicia Fox is a 70 y.o. female.    History of Present Illness  I had the pleasure of seeing Alicia Fox in clinic for OSA follow-up. She is known to Dr. Andria Meuse. 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 She is wearing a full face mask. Pressure is comfortable. Has been noticing varying amounts of water used in her humidifier over the last few months    Has some trouble falling asleep. Takes an hour and a half to two hours to fall asleep. Taking melatonin 10mg . This has been going on the last several months   Takes Melatonin at 2130-2200, gets into bed around 2230  TV is on all night long, knows this isn't helping  Wakes up around [phone]     Was on Ambien for many years, does not want to go back to that     She has lost a lot of family members over the last several years  Has bene caregiver and executer of estates  Has a lot of things going on  Does not want to go to sleep, has some fear    Is close with her Brother in [place]. [place]  Kids and grandkids are busy    [place] 6/24       Review of Systems   Constitutional: Positive for activity change and appetite change.   HENT: Positive for congestion.    Respiratory: Positive for apnea, cough and shortness of breath.    Cardiovascular: Positive for leg swelling.   Psychiatric/Behavioral: Positive for dysphoric mood.   All other systems reviewed and are negative.        Objective:         ? acetaminophen (TYLENOL) 325 mg tablet Take two tablets by mouth every 6 hours as needed. For fever or pain.   ? ascorbic acid (VITAMIN C) 500 mg tablet Take 500 mg by mouth daily.   ? atorvastatin (LIPITOR) 10 mg tablet TAKE 1 TABLET BY MOUTH EVERY DAY   ? carvediloL (COREG) 3.125 mg tablet Take one tablet by mouth twice daily with meals. Take with food.   ? cetirizine (ZYRTEC) 10 mg tablet Take 10 mg by mouth daily.   ? cholecalciferol (VITAMIN D-3) 1,000 units tablet Take 1,000 Units by mouth daily.   ? fexofenadine HCl (MUCINEX ALLERGY PO) Take 1 tablet by mouth daily.   ? furosemide (LASIX) 40 mg tablet TAKE ONE TABLET BY MOUTH DAILY AS NEEDED.   ? gabapentin (NEURONTIN) 100 mg capsule Take two capsules by mouth twice daily.   ? levothyroxine (SYNTHROID) 88 mcg tablet TAKE 1 TABLET BY MOUTH EVERY DAY IN THE MORNING   ? losartan (COZAAR) 50 mg tablet TAKE 1 TABLET BY MOUTH TWICE A DAY   ? melatonin 10 mg cap Take 10 mg by mouth daily.   ? metFORMIN (GLUCOPHAGE) 500 mg tablet TAKE 1 TAB BY MOUTH ONCE DAILY FOR 2 WEEKS THEN INCREASE TO TWICE DAILY AFTER MEALS   ? oxybutynin XL (DITROPAN XL) 10 mg tablet Take one tablet by mouth daily.   ? pantoprazole DR (PROTONIX) 40 mg tablet TAKE 1 TABLET BY MOUTH TWICE A DAY   ? polycarbophil (FIBERCON) 625 mg tablet Take 625 mg by mouth twice daily.   ? Psyllium Husk 0.52 gram cap Take 1 capsule by mouth twice daily.   ? QUERCETIN DIHYDRATE (BULK) MISC  Take 1 tablet by mouth daily.   ? simethicone (MYLICON) 80 mg chew tablet as Needed.   ? vit C,E-Zn-coppr-lutein-zeaxan (PRESERVISION AREDS-2) 250-200-40-1 mg-unit-mg-mg cap Take 1 capsule by mouth twice daily.   ? Zinc 50 mg tab Take 1 tablet by mouth daily.     Vitals:    08/16/20 1053   BP: 129/75   Pulse: 74   Temp: 36.9 ?C (98.4 ?F)   SpO2: 95%   TempSrc: Skin   PainSc: Four   Weight: 112.5 kg (248 lb)   Height: 162.6 cm (5' 4)  Comment: per pt     Body mass index is 42.57 kg/m?Marland Kitchen     Physical Exam  Vitals reviewed.   Constitutional:       Appearance: Normal appearance.   Pulmonary:      Effort: Pulmonary effort is normal. No respiratory distress.   Skin:     General: Skin is warm and dry.   Neurological:      Mental Status: She is alert and oriented to person, place, and time.   Psychiatric:         Mood and Affect: Mood normal.         Behavior: Behavior normal.         Thought Content: Thought content normal.         Judgment: Judgment normal.                  Assessment and Plan:    Problem   Other Insomnia   Osa (Obstructive Sleep Apnea)    Split night Pap titration dated 10/21/2017 at the [facility] showed severe obstructive sleep apnea with AHI of 105.6 events per hour successful CPAP titration 12 cmH2O using full facemask elevated frequency of.  Limb movement with index of 30.8 events per hour and nocturnal hypoxemia with oxygen desaturation nadir of 77% oxygen saturation below 88% for about 99.1 minutes    Pt set up with CPAP @ 12cm H2O on 12/04/2017.  DME: [name]          OSA (obstructive sleep apnea)  Reviewed CPAP download. 30/30 days of usage.   Wearing 4 hours or greater 100% of nights. The average use on days used was 8 hours and 26 minutes.  Residual AHI 1.4.    The median leak was 0.3 L/min. She is tolerating and benefiting from therapy.     - Will continue current settings, CPAP 12.     - Keep in contact with DME for routine supplies, need to contact us directly if unable to communicate with DME in a timely manner.     Other insomnia  - Discussed sleep hygiene and sleep restriction strategies  - Encouraged her to take Melatonin later and to stay up just a bit longer  - Use sleep timer in her room to get tv turned off at some point during the night  - Discussed importance of a consistent and calming bedtime routine  - Increase daytime physical activity if able  - Strongly encouraged her to continue working with Dr. Larina Bras and their office's therapist for treatment of depression as I think her depression is contributing to her insomnia and daytime fatigue symptoms    She has our contact information and was encouraged to call us with any questions or concerns.    RTC in 6 months    Future Appointments   Date Time Provider Department Center   08/24/2020 10:45 AM [facility]   10/04/2020 11:15 AM Haynes Bast, RD [facility]   11/01/2020 10:20 AM Marshia Ly, PA-C [facility]     Total time- 30 minutes

## 2020-08-16 NOTE — Assessment & Plan Note
-   Discussed sleep hygiene and sleep restriction strategies  - Encouraged her to take Melatonin later and to stay up just a bit longer  - Use sleep timer in her room to get tv turned off at some point during the night  - Discussed importance of a consistent and calming bedtime routine  - Increase daytime physical activity if able  - Strongly encouraged her to continue working with Dr. Joaquim Lai and their office's therapist for treatment of depression as I think her depression is contributing to her insomnia and daytime fatigue symptoms

## 2020-08-17 ENCOUNTER — Encounter: Admit: 2020-08-17 | Discharge: 2020-08-17 | Payer: MEDICARE

## 2020-08-24 ENCOUNTER — Ambulatory Visit: Admit: 2020-08-24 | Discharge: 2020-08-24 | Payer: MEDICARE

## 2020-08-24 ENCOUNTER — Encounter: Admit: 2020-08-24 | Discharge: 2020-08-24 | Payer: MEDICARE

## 2020-08-24 DIAGNOSIS — Z1231 Encounter for screening mammogram for malignant neoplasm of breast: Secondary | ICD-10-CM

## 2020-09-10 ENCOUNTER — Encounter: Admit: 2020-09-10 | Discharge: 2020-09-10 | Payer: MEDICARE

## 2020-09-10 MED ORDER — OXYBUTYNIN CHLORIDE 10 MG PO TR24
ORAL_TABLET | Freq: Every day | 1 refills
Start: 2020-09-10 — End: ?

## 2020-09-10 MED ORDER — ATORVASTATIN 10 MG PO TAB
ORAL_TABLET | Freq: Every day | 1 refills
Start: 2020-09-10 — End: ?

## 2020-09-24 ENCOUNTER — Encounter: Admit: 2020-09-24 | Discharge: 2020-09-24 | Payer: MEDICARE

## 2020-09-24 MED ORDER — FUROSEMIDE 40 MG PO TAB
40 mg | ORAL_TABLET | Freq: Every day | ORAL | 1 refills | PRN
Start: 2020-09-24 — End: ?

## 2020-10-04 ENCOUNTER — Encounter: Admit: 2020-10-04 | Discharge: 2020-10-04 | Payer: MEDICARE

## 2020-10-11 ENCOUNTER — Encounter: Admit: 2020-10-11 | Discharge: 2020-10-11 | Payer: MEDICARE

## 2020-10-11 MED ORDER — PANTOPRAZOLE 40 MG PO TBEC
ORAL_TABLET | Freq: Two times a day (BID) | ORAL | 1 refills | 90.00000 days | Status: AC
Start: 2020-10-11 — End: ?

## 2020-10-23 ENCOUNTER — Encounter: Admit: 2020-10-23 | Discharge: 2020-10-23 | Payer: MEDICARE

## 2020-10-25 ENCOUNTER — Encounter: Admit: 2020-10-25 | Discharge: 2020-10-25 | Payer: MEDICARE

## 2020-10-25 NOTE — Telephone Encounter
LVM for patient to call back to reschedule her appointment on August 31 with [name]. I left my direct number for call back.

## 2020-11-07 ENCOUNTER — Encounter: Admit: 2020-11-07 | Discharge: 2020-11-07 | Payer: MEDICARE

## 2020-11-07 DIAGNOSIS — M545 Acute bilateral low back pain without sciatica: Secondary | ICD-10-CM

## 2020-11-08 ENCOUNTER — Encounter: Admit: 2020-11-08 | Discharge: 2020-11-08 | Payer: MEDICARE

## 2020-11-08 ENCOUNTER — Ambulatory Visit: Admit: 2020-11-08 | Discharge: 2020-11-08 | Payer: MEDICARE

## 2020-11-08 DIAGNOSIS — M545 Acute bilateral low back pain without sciatica: Secondary | ICD-10-CM

## 2020-11-08 LAB — URINALYSIS DIPSTICK REFLEX TO CULTURE
GLUCOSE,UA: NEGATIVE
LEUKOCYTES: NEGATIVE
NITRITE: NEGATIVE
PROTEIN,UA: NEGATIVE
URINE ASCORBIC ACID, UA: POSITIVE — AB
URINE BILE: NEGATIVE
URINE BLOOD: NEGATIVE
URINE KETONE: NEGATIVE
URINE PH: 7 (ref 5.0–8.0)
URINE SPEC GRAVITY: 1 (ref 1.005–1.030)

## 2020-11-08 LAB — COMPREHENSIVE METABOLIC PANEL
ALBUMIN: 4.3 g/dL (ref 3.5–5.0)
ALK PHOSPHATASE: 117 U/L — ABNORMAL HIGH (ref 25–110)
ALT: 15 U/L (ref 7–56)
ANION GAP: 9 (ref 3–12)
AST: 14 U/L (ref 7–40)
BLD UREA NITROGEN: 11 mg/dL — ABNORMAL LOW (ref 7–25)
CALCIUM: 10 mg/dL — ABNORMAL HIGH (ref 8.5–10.6)
CO2: 27 MMOL/L (ref 21–30)
CREATININE: 0.5 mg/dL — ABNORMAL HIGH (ref 0.4–1.00)
EGFR: >60 mL/min (ref >60)
POTASSIUM: 4.7 MMOL/L (ref 3.5–5.1)
TOTAL BILIRUBIN: 0.5 mg/dL (ref 0.3–1.2)

## 2020-11-08 LAB — URINALYSIS MICROSCOPIC REFLEX TO CULTURE

## 2020-11-08 LAB — CBC: MCH: 22 pg — ABNORMAL LOW (ref 26–34)

## 2020-11-11 ENCOUNTER — Encounter: Admit: 2020-11-11 | Discharge: 2020-11-11 | Payer: MEDICARE

## 2020-11-11 MED ORDER — GABAPENTIN 100 MG PO CAP
ORAL_CAPSULE | Freq: Two times a day (BID) | 1 refills
Start: 2020-11-11 — End: ?

## 2020-11-19 ENCOUNTER — Encounter: Admit: 2020-11-19 | Discharge: 2020-11-19 | Payer: MEDICARE

## 2020-11-21 ENCOUNTER — Encounter: Admit: 2020-11-21 | Discharge: 2020-11-21 | Payer: MEDICARE

## 2020-11-21 ENCOUNTER — Ambulatory Visit: Admit: 2020-11-21 | Discharge: 2020-11-22 | Payer: MEDICARE

## 2020-11-21 DIAGNOSIS — M539 Dorsopathy, unspecified: Secondary | ICD-10-CM

## 2020-11-21 MED ORDER — MELOXICAM 15 MG PO TAB
15 mg | ORAL_TABLET | Freq: Every day | ORAL | 0 refills | 30.00000 days | Status: AC | PRN
Start: 2020-11-21 — End: ?

## 2020-11-21 NOTE — Progress Notes
Telehealth Visit Note    Date of Service: 11/21/2020      Subjective:      Obtained patient's verbal consent to treat them and their agreement to [place] financial policy and NPP via this telehealth visit during the [facility]       Alicia Fox is a 70 y.o. female.  She complains of back pain throughout her back.  She states that it ongoing for several months.  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  Patient complains of some weakness and has been walking with a cane.  She has at times taken ibuprofen and also takes Tylenol with minimal relief.  Patient also wonders if physical therapy could be helpful for this.  She has had some weight loss over the past 2 weeks of about 15 pounds and plans to lose more.  She thinks that this could be helpful for her pain.    Chief Complaint   Patient presents with   ? Follow Up     Back pain      Back Pain  This is a chronic problem. The current episode started more than 1 month ago. The problem occurs constantly. The problem has been waxing and waning since onset. The pain is present in the sacro-iliac. The quality of the pain is described as aching, cramping and stabbing. The pain radiates to the does not radiate and right thigh. The pain is at a severity of 8/10. The pain is worse during the day. The symptoms are aggravated by bending, coughing, position, sitting, standing and twisting. Stiffness is present at night and all day. Associated symptoms include bladder incontinence, headaches, numbness, paresthesias, tingling and weakness. Pertinent negatives include no abdominal pain, bowel incontinence, chest pain, dysuria, fever, leg pain, paresis, pelvic pain, perianal numbness or weight loss. Risk factors include history of cancer, lack of exercise, obesity, recent trauma and sedentary lifestyle.            Review of Systems   Constitutional: Negative for fever and weight loss.   Cardiovascular: Negative for chest pain. Gastrointestinal: Negative for abdominal pain and bowel incontinence.   Genitourinary: Positive for bladder incontinence. Negative for dysuria and pelvic pain.   Musculoskeletal: Positive for back pain.   Neurological: Positive for tingling, weakness, numbness, headaches and paresthesias.         Objective:         ? acetaminophen (TYLENOL) 325 mg tablet Take two tablets by mouth every 6 hours as needed. For fever or pain.   ? ascorbic acid (VITAMIN C) 500 mg tablet Take 500 mg by mouth daily.   ? atorvastatin (LIPITOR) 10 mg tablet TAKE 1 TABLET BY MOUTH EVERY DAY   ? carvediloL (COREG) 3.125 mg tablet Take one tablet by mouth twice daily with meals. Take with food.   ? cetirizine (ZYRTEC) 10 mg tablet Take 10 mg by mouth daily.   ? cholecalciferol (VITAMIN D-3) 1,000 units tablet Take 1,000 Units by mouth daily.   ? fexofenadine HCl (MUCINEX ALLERGY PO) Take 1 tablet by mouth daily.   ? furosemide (LASIX) 40 mg tablet TAKE ONE TABLET BY MOUTH DAILY AS NEEDED.   ? gabapentin (NEURONTIN) 100 mg capsule TAKE 2 CAPSULES BY MOUTH TWICE A DAY   ? levothyroxine (SYNTHROID) 88 mcg tablet TAKE 1 TABLET BY MOUTH EVERY DAY IN THE MORNING   ? losartan (COZAAR) 50 mg tablet TAKE 1 TABLET BY MOUTH TWICE A DAY   ?  melatonin 10 mg cap Take 10 mg by mouth daily.   ? meloxicam (MOBIC) 15 mg tablet Take one tablet by mouth daily as needed for Pain.   ? metFORMIN (GLUCOPHAGE) 500 mg tablet TAKE 1 TAB BY MOUTH ONCE DAILY FOR 2 WEEKS THEN INCREASE TO TWICE DAILY AFTER MEALS   ? oxybutynin XL (DITROPAN XL) 10 mg tablet TAKE 1 TABLET BY MOUTH DAILY   ? pantoprazole DR (PROTONIX) 40 mg tablet TAKE 1 TABLET BY MOUTH TWICE A DAY   ? polycarbophil (FIBERCON) 625 mg tablet Take 625 mg by mouth twice daily.   ? Psyllium Husk 0.52 gram cap Take 1 capsule by mouth twice daily.   ? QUERCETIN DIHYDRATE (BULK) MISC Take 1 tablet by mouth daily.   ? simethicone (MYLICON) 80 mg chew tablet as Needed.   ? vit C,E-Zn-coppr-lutein-zeaxan (PRESERVISION AREDS-2) 250-200-40-1 mg-unit-mg-mg cap Take 1 capsule by mouth twice daily.   ? Zinc 50 mg tab Take 1 tablet by mouth daily.      Telehealth Patient Reported Vitals     Row Name 11/21/20 0937                BP: 124/78        BP Position: Sitting        Pulse: 87        Weight: 111.4 kg (245 lb 9.6 oz)        Pain Score: SEVEN        Pain Location: BACK                  There is no height or weight on file to calculate BMI.     Physical Exam  Gen: NAD, alert and oriented  Pulm: nonlabored breathing, able to speak in complete sentences without difficulty  Psych: normal affect         Assessment and Plan:  Alicia Fox was seen today for follow up.    Diagnoses and all orders for this visit:    Multilevel degenerative disc disease    Other orders  -     meloxicam (MOBIC) 15 mg tablet; Take one tablet by mouth daily as needed for Pain.    Reviewed x-ray and it does show multilevel degenerative disc disease.  Based on symptoms, I would guess that patient's arthritis is in her cervical, thoracic and lumbar spine.  Recommend patient trial glucosamine daily for a month to see if this improves her pain.  Will order meloxicam 15 mg to use daily as needed.  Agree with weight loss goals.  Also refer to physical therapy to strengthen muscles and improve range of motion.      Problem   Multilevel Degenerative Disc Disease                       14 minutes spent on this patient's encounter with counseling and coordination of care taking >50% of the visit.

## 2020-11-22 ENCOUNTER — Encounter: Admit: 2020-11-22 | Discharge: 2020-11-22 | Payer: MEDICARE

## 2020-11-22 NOTE — Telephone Encounter
1 hour ago (9:50 AM)     I had gained some weight and for past 2 weeks worked very hard and lost 15lbs of what I had gained. Tracking my sodium over this 2 weeks and it ranges between 670 - 200. My legs keep swelling up. Ankles are very slimmed down in morning when I wake up but in very short time, legs and ankles are swollen. I'm asking if you need to see me or run any kind of test such as sonogram or EKG? I'm leaving October 20 for a few weeks for family matter and driving to [facility]. Concerned about the drive and impact on my fluid retention and swelling.    I am available to go [employer] or [place] offices for testing and/or visit.   Please advise   Thank you  Marge Buehring       Edema    Location: lower extremity  Onset: 23mo    Patient description:  Pitting?  Tenderness? no  Associated symptoms: no  Recent travel/prolonged sitting:  If yes, consider DVT.  no    Additional relevant information:   Recent medications changes?  Relevant lab results? none    Care Advice: advised to try compression stockings.  Patient verbalized understanding.    Disposition:  Scheduled for OV with [facility]

## 2020-11-23 ENCOUNTER — Encounter: Admit: 2020-11-23 | Discharge: 2020-11-23 | Payer: MEDICARE

## 2020-11-27 ENCOUNTER — Encounter: Admit: 2020-11-27 | Discharge: 2020-11-27 | Payer: MEDICARE

## 2020-12-07 ENCOUNTER — Encounter: Admit: 2020-12-07 | Discharge: 2020-12-07 | Payer: MEDICARE

## 2020-12-07 ENCOUNTER — Ambulatory Visit: Admit: 2020-12-07 | Discharge: 2020-12-07 | Payer: MEDICARE

## 2020-12-07 DIAGNOSIS — E782 Mixed hyperlipidemia: Secondary | ICD-10-CM

## 2020-12-07 DIAGNOSIS — E079 Disorder of thyroid, unspecified: Secondary | ICD-10-CM

## 2020-12-07 DIAGNOSIS — Z86010 Personal history of colonic polyps: Secondary | ICD-10-CM

## 2020-12-07 DIAGNOSIS — K929 Disease of digestive system, unspecified: Secondary | ICD-10-CM

## 2020-12-07 DIAGNOSIS — C801 Malignant (primary) neoplasm, unspecified: Secondary | ICD-10-CM

## 2020-12-07 DIAGNOSIS — Z8616 History of COVID-19: Secondary | ICD-10-CM

## 2020-12-07 DIAGNOSIS — N3941 Urge incontinence: Secondary | ICD-10-CM

## 2020-12-07 DIAGNOSIS — J189 Pneumonia, unspecified organism: Secondary | ICD-10-CM

## 2020-12-07 DIAGNOSIS — M1990 Unspecified osteoarthritis, unspecified site: Secondary | ICD-10-CM

## 2020-12-07 DIAGNOSIS — T887XXA Unspecified adverse effect of drug or medicament, initial encounter: Secondary | ICD-10-CM

## 2020-12-07 DIAGNOSIS — G5603 Carpal tunnel syndrome, bilateral upper limbs: Secondary | ICD-10-CM

## 2020-12-07 DIAGNOSIS — N3946 Mixed incontinence: Principal | ICD-10-CM

## 2020-12-07 DIAGNOSIS — G4733 Obstructive sleep apnea (adult) (pediatric): Secondary | ICD-10-CM

## 2020-12-07 DIAGNOSIS — I10 Essential (primary) hypertension: Secondary | ICD-10-CM

## 2020-12-07 DIAGNOSIS — I3139 Pericardial effusion: Secondary | ICD-10-CM

## 2020-12-07 DIAGNOSIS — D3A090 Benign carcinoid tumor of the bronchus and lung: Secondary | ICD-10-CM

## 2020-12-07 DIAGNOSIS — U071 Pneumonia due to COVID-19 virus: Secondary | ICD-10-CM

## 2020-12-07 DIAGNOSIS — E785 Hyperlipidemia, unspecified: Secondary | ICD-10-CM

## 2020-12-07 DIAGNOSIS — F99 Mental disorder, not otherwise specified: Secondary | ICD-10-CM

## 2020-12-07 DIAGNOSIS — I5032 Chronic diastolic (congestive) heart failure: Secondary | ICD-10-CM

## 2020-12-07 MED ORDER — TORSEMIDE 20 MG PO TAB
20 mg | ORAL_TABLET | Freq: Every day | ORAL | 3 refills | 67.50000 days | Status: AC
Start: 2020-12-07 — End: ?

## 2020-12-07 MED ORDER — TORSEMIDE 20 MG PO TAB
20 mg | ORAL_TABLET | Freq: Every day | ORAL | 3 refills | 67.50000 days | Status: DC
Start: 2020-12-07 — End: 2020-12-07

## 2020-12-07 MED ORDER — OXYBUTYNIN CHLORIDE 10 MG PO TR24
10 mg | ORAL_TABLET | Freq: Two times a day (BID) | ORAL | 3 refills | 12.00000 days | Status: AC
Start: 2020-12-07 — End: ?

## 2020-12-07 NOTE — Progress Notes
PVR_ 15 ml.

## 2020-12-07 NOTE — Progress Notes
Date of Service: 12/07/2020     Subjective:             Alicia Fox is a 70 y.o. female who presents for incontinence.    History of Present Illness  Alicia Fox is a 70 y.o. female with HTN, HLD, colon polpys, depression, hypothyroid, breast cancer s/p resection, arthritis, obesity, recent pericardial effusion, and diastolic heart failure. Presenting regarding urge urinary incontinence. Patient has noticed this problem for several years but has worsened significantly since starting diuretics. Patient currently using 4-5 pads per day and getting up 1-2 times per night to urinate.  She denies dysuria, hematuria, UTI, and flank pain. Does not notice leakage while getting up or exerting herself.  She has been on oxybutynin 10mg  XR for awhile now which she has felt improved her symptoms. Denies any side effects from this medication.      Medical History:   Diagnosis Date   ? Arthritis     lumbar, knees   ? Cancer [facility])     breast   ? Carpal tunnel syndrome on both sides 2018    L > R   ? Disorder of thyroid gland     hypothyroid   ? Gastrointestinal disorder     gerd at times-under control with meds   ? HTN (hypertension)    ? Hx of adenomatous colonic polyps    ? Hyperlipidemia    ? Pneumonia     bilateral bacterial   ? Psychiatric illness     depression       Surgical History:   Procedure Laterality Date   ? HX MASTECTOMY Bilateral 1986    1986-stomberg mastectomy   ? BREAST BIOPSY  07/1984   ? MASTECTOMY, PARTIAL Bilateral 12/1984   ? MASTECTOMY, MODIFIED RADICAL Bilateral 1987    1987-full modified   ? BREAST SURGERY Bilateral 07/1985    implants   ? HX DILATION AND CURETTAGE  07/1994   ? HX HYSTERECTOMY  10/1994    complete   ? HX ABDOMINOPLASTY  10/1994   ? HIP SURGERY  02/1995   ? HX LIPOMA RESECTION  11/1999    outer upper left arm   ? ANUS SURGERY  01/2010    anal fissure   ? HX LIPOMA RESECTION  02/2010    inside upper right arm   ? LEFT DECOMPRESSION NERVE MEDIAN WITH CARPAL TUNNEL RELEASE Left 10/17/2016    Performed by Delice Lesch, MD at [facility] OR   ? BRONCHOSCOPY N/A 01/13/2017    Performed by Audie Box, MD at [facility] OR   ? BRONCHOSCOPY WITH TRANSBRONCHIAL BIOPSY N/A 01/13/2017    Performed by Audie Box, MD at [facility] OR   ? BRONCHOSCOPY WITH ENDOBRONCHIAL ULTRASOUND GUIDED TRANSTRACHEAL/ TRANSBRONCHIAL SAMPLING - 3 OR MORE MEDIASTINAL/ HILAR LYMPH NODE STATIONS/ STRUCTURE - RIGID N/A 01/13/2017    Performed by Audie Box, MD at [facility] OR   ? BRONCHOSCOPY WITH IMAGE - GUIDED NAVIGATION - FLEXIBLE N/A 01/13/2017    Performed by Audie Box, MD at [facility] OR   ? ROBOTIC LEFT LOWER LOBECTOMY Left 03/21/2017    Performed by Particia Nearing, MD at [facility] CVOR   ? Colonoscopy N/A 10/01/2019    Performed by Meyer Cory, MD at [facility] ENDO   ? ESOPHAGOGASTRODUODENOSCOPY WITH SPECIMEN COLLECTION BY BRUSHING/ WASHING N/A 10/01/2019    Performed by Meyer Cory, MD at [facility] ENDO   ? ESOPHAGOGASTRODUODENOSCOPY WITH SNARE REMOVAL TUMOR/ POLYP/ OTHER LESION - FLEXIBLE N/A 10/01/2019  Performed by Meyer Cory, MD at [facility] ENDO   ? ESOPHAGOGASTRODUODENOSCOPY WITH BIOPSY - FLEXIBLE N/A 10/01/2019    Performed by Meyer Cory, MD at [facility] ENDO   ? COLONOSCOPY WITH SNARE REMOVAL TUMOR/ POLYP/ OTHER LESION  10/01/2019    Performed by Meyer Cory, MD at [facility] ENDO   ? SPINE SURGERY  1993 and 1996    cervical fusion       Family History   Problem Relation Age of Onset   ? Complete Hysterectomy Mother 1        uterine prolaps   ? Hypertension Mother    ? Atrial Fibrillation Mother    ? Hypertension Father    ? COPD Father    ? Heart Failure Father    ? Kidney Cancer Brother 14   ? Cancer-Prostate Brother 60   ? Intellectual Disability Brother    ? Heart Attack Paternal Uncle    ? Cancer-Hematologic Maternal Grandfather         leukemia   ? Cancer-Breast Paternal Grandmother 46   ? Cancer-Breast Maternal Aunt 84       Current Outpatient Medications   Medication Sig Dispense Refill   ? acetaminophen (TYLENOL) 325 mg tablet Take two tablets by mouth every 6 hours as needed. For fever or pain.     ? ascorbic acid (VITAMIN C) 500 mg tablet Take 500 mg by mouth daily.     ? atorvastatin (LIPITOR) 10 mg tablet TAKE 1 TABLET BY MOUTH EVERY DAY 90 tablet 1   ? carvediloL (COREG) 3.125 mg tablet Take one tablet by mouth twice daily with meals. Take with food. 180 tablet 3   ? cetirizine (ZYRTEC) 10 mg tablet Take 10 mg by mouth daily.     ? cholecalciferol (VITAMIN D-3) 1,000 units tablet Take 1,000 Units by mouth daily.     ? fexofenadine HCl (MUCINEX ALLERGY PO) Take 1 tablet by mouth daily.     ? gabapentin (NEURONTIN) 100 mg capsule TAKE 2 CAPSULES BY MOUTH TWICE A DAY 360 capsule 0   ? glucosamine/msm/chondrt/C/hyal (GLUCOSAMINE-CHONDROITIN-MSM PO) Take 1 tablet by mouth daily.     ? levothyroxine (SYNTHROID) 88 mcg tablet TAKE 1 TABLET BY MOUTH EVERY DAY IN THE MORNING 90 tablet 1   ? losartan (COZAAR) 50 mg tablet TAKE 1 TABLET BY MOUTH TWICE A DAY 180 tablet 3   ? melatonin 10 mg cap Take 10 mg by mouth daily.     ? meloxicam (MOBIC) 15 mg tablet Take one tablet by mouth daily as needed for Pain. 90 tablet 0   ? metFORMIN (GLUCOPHAGE) 500 mg tablet TAKE 1 TAB BY MOUTH ONCE DAILY FOR 2 WEEKS THEN INCREASE TO TWICE DAILY AFTER MEALS 180 tablet 3   ? oxybutynin XL (DITROPAN XL) 10 mg tablet TAKE 1 TABLET BY MOUTH DAILY 90 tablet 1   ? pantoprazole DR (PROTONIX) 40 mg tablet TAKE 1 TABLET BY MOUTH TWICE A DAY 180 tablet 1   ? polycarbophil (FIBERCON) 625 mg tablet Take 625 mg by mouth twice daily.     ? Psyllium Husk 0.52 gram cap Take 1 capsule by mouth twice daily.     ? QUERCETIN DIHYDRATE (BULK) MISC Take 1 tablet by mouth daily.     ? simethicone (MYLICON) 80 mg chew tablet as Needed.     ? torsemide (DEMADEX) 20 mg tablet Take one tablet by mouth daily. Indications: visible water retention 30 tablet 3   ? vit C,E-Zn-coppr-lutein-zeaxan (PRESERVISION AREDS-2)  250-200-40-1 mg-unit-mg-mg cap Take 1 capsule by mouth twice daily.     ? Zinc 50 mg tab Take 1 tablet by mouth daily.       No current facility-administered medications for this visit.       Allergies   Allergen Reactions   ? Codeine HEADACHE and ITCHING       Social History     Socioeconomic History   ? Marital status: Widowed   Occupational History   ? Occupation: retired   Tobacco Use   ? Smoking status: Former Smoker     Quit date: 03/04/1989     Years since quitting: 31.7   ? Smokeless tobacco: Never Used   ? Tobacco comment: quit in 1991   Substance and Sexual Activity   ? Alcohol use: Not Currently   ? Drug use: No       Review of Systems   Constitutional: Negative for chills, fatigue, fever and unexpected weight change.   HENT: Negative.    Eyes: Negative.    Respiratory: Negative.    Cardiovascular: Negative.    Gastrointestinal: Negative.    Genitourinary: Positive for enuresis and frequency. Negative for dysuria, flank pain, hematuria and urgency.   Musculoskeletal: Positive for arthralgias.   Skin: Negative.    Neurological: Negative.    Hematological: Negative.    Psychiatric/Behavioral: Negative.        Objective:         ? acetaminophen (TYLENOL) 325 mg tablet Take two tablets by mouth every 6 hours as needed. For fever or pain.   ? ascorbic acid (VITAMIN C) 500 mg tablet Take 500 mg by mouth daily.   ? atorvastatin (LIPITOR) 10 mg tablet TAKE 1 TABLET BY MOUTH EVERY DAY   ? carvediloL (COREG) 3.125 mg tablet Take one tablet by mouth twice daily with meals. Take with food.   ? cetirizine (ZYRTEC) 10 mg tablet Take 10 mg by mouth daily.   ? cholecalciferol (VITAMIN D-3) 1,000 units tablet Take 1,000 Units by mouth daily.   ? fexofenadine HCl (MUCINEX ALLERGY PO) Take 1 tablet by mouth daily.   ? gabapentin (NEURONTIN) 100 mg capsule TAKE 2 CAPSULES BY MOUTH TWICE A DAY   ? glucosamine/msm/chondrt/C/hyal (GLUCOSAMINE-CHONDROITIN-MSM PO) Take 1 tablet by mouth daily.   ? levothyroxine (SYNTHROID) 88 mcg tablet TAKE 1 TABLET BY MOUTH EVERY DAY IN THE MORNING   ? losartan (COZAAR) 50 mg tablet TAKE 1 TABLET BY MOUTH TWICE A DAY   ? melatonin 10 mg cap Take 10 mg by mouth daily.   ? meloxicam (MOBIC) 15 mg tablet Take one tablet by mouth daily as needed for Pain.   ? metFORMIN (GLUCOPHAGE) 500 mg tablet TAKE 1 TAB BY MOUTH ONCE DAILY FOR 2 WEEKS THEN INCREASE TO TWICE DAILY AFTER MEALS   ? oxybutynin XL (DITROPAN XL) 10 mg tablet TAKE 1 TABLET BY MOUTH DAILY   ? pantoprazole DR (PROTONIX) 40 mg tablet TAKE 1 TABLET BY MOUTH TWICE A DAY   ? polycarbophil (FIBERCON) 625 mg tablet Take 625 mg by mouth twice daily.   ? Psyllium Husk 0.52 gram cap Take 1 capsule by mouth twice daily.   ? QUERCETIN DIHYDRATE (BULK) MISC Take 1 tablet by mouth daily.   ? simethicone (MYLICON) 80 mg chew tablet as Needed.   ? torsemide (DEMADEX) 20 mg tablet Take one tablet by mouth daily. Indications: visible water retention   ? vit C,E-Zn-coppr-lutein-zeaxan (PRESERVISION AREDS-2) 250-200-40-1 mg-unit-mg-mg cap Take 1 capsule by mouth  twice daily.   ? Zinc 50 mg tab Take 1 tablet by mouth daily.     There were no vitals filed for this visit.  There is no height or weight on file to calculate BMI.     Physical Exam  Constitutional:       Appearance: She is obese.   HENT:      Head: Normocephalic and atraumatic.   Eyes:      Extraocular Movements: Extraocular movements intact.   Cardiovascular:      Rate and Rhythm: Normal rate.   Pulmonary:      Effort: Pulmonary effort is normal.   Abdominal:      Palpations: Abdomen is soft.   Genitourinary:     Comments: Normal vaginal exam  No prolapses or masses  Leaks with cough and valsalva  Musculoskeletal:         General: Normal range of motion.      Cervical back: Normal range of motion.   Skin:     General: Skin is warm.   Neurological:      General: No focal deficit present.      Mental Status: She is alert.   Psychiatric:         Mood and Affect: Mood normal.         Behavior: Behavior normal.            Assessment and Plan:  Alicia Fox ia a 69 year female presenting with mixed urge and stress urinary incontinence. Discussed treatment options for urge urinary incontinence including anticholinergics, Myrbetriq, pelvic flor physical therapy, PTNS, InterStim, and Botox. She has had good results with the oxybutynin and takes it at night.  Will increase her usage to BID and see if it helps her daytime symptoms.    Plan:  > Oxybutynin BID 10mg   > Pelvic floor physical therapy  > Behavioral modifications  > RTC 3 months                Patient seen and discussed with Dr. Littie Deeds, who directed plan of care.    Lowell Guitar, MD  Urology PGY-2      GU STAFF NOTE    History and examination reviewed and key elements confirmed.    Discussed with the patient in detail and questions answered.    I personally saw and evaluated the patient and determined the plan of care.  I personally performed the key portions of the E&M visit, discussed the case with the resident and/or student, and concur with documentation of history, physical examination, assessment, and treatment plan unless otherwise noted.    Maziyah Vessel L. Littie Deeds, MD, MPH    Total visit time > 45 minutes including review of records, history, exam, counseling, coordination of care, and documentation.  Coding also based on visit complexity.

## 2020-12-07 NOTE — Progress Notes
Date of Service: 12/07/2020    Alicia Fox is a 70 y.o. female.       HPI      Alicia Fox is a 70 y.o. 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    Patient does not report having any symptoms of chest pain, no heart palpitations, no presyncope or syncope.  She continues to have bilateral lower extremity edema although she does follow a strict low-sodium diet.  Her blood pressure today was elevated, we did record 148/84 mmHg in the left arm and 156/90 mmHg in the right arm with a heart rate of 76 bpm.    Patient currently is on carvedilol, an ARB and she takes furosemide as needed.         Vitals:    12/07/20 1058 12/07/20 1109   BP: (!) 148/84 (!) 156/90   BP Source: Arm, Left Upper Arm, Right Upper   Pulse: 76    SpO2: 95%    O2 Percent: 95 %    O2 Device: None (Room air)    PainSc: Zero    Weight: 111.9 kg (246 lb 12.8 oz)    Height: 162.6 cm (5' 4)      Body mass index is 42.36 kg/m?Marland Kitchen     Past Medical History  Patient Active Problem List    Diagnosis Date Noted   ? History of COVID-19 05/13/2019     Priority: Medium     She developed COVID in 12/20 with week long hospitalization and need for oxygen/steroids but not sent home on oxygen.  She apparently had pericardial effusion also as complication but no recurrence since single pericardiocentesis in March 2021.      ? 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 Other insomnia 08/16/2020   ? Elevated hemoglobin A1c 06/01/2020 ? 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 Hooded upper eyelid 08/04/2019   ? COVID-19 vaccine administered 07/15/2019   ? Other headache syndrome 05/28/2019   ? COVID-19 vaccine administered 05/28/2019   ? Pericardial effusion 05/11/2019   ? Shortness of breath with exposure to severe acute respiratory syndrome coronavirus 2 (SARS-CoV-2) 04/05/2019   ? Morbid obesity (HCC) 02/18/2019   ? Chest wall pain 02/18/2019   ? Pneumonia due to COVID-19 virus 02/15/2019   ? COVID-19 02/05/2019   ? OSA (obstructive sleep apnea) 02/17/2018     Split night Pap titration dated 10/21/2017 at the [facility] sleep lab showed severe obstructive sleep apnea with AHI of 105.6 events per hour successful CPAP titration 12 cmH2O using full facemask elevated frequency of.  Limb movement with index of 30.8 events per hour and nocturnal hypoxemia with oxygen desaturation nadir of 77% oxygen saturation below 88% for about 99.1 minutes    Pt set up with CPAP @ 12cm H2O on 12/04/2017.  DME: [name]     ? 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 Particia Nearing. ?Pathology impression: ?Lung: Typical carcinoid tumor. ??Greatest dimension (  centimeters): 3.0 cm.?Additional dimensions (centimeters): 2.5 x 2.5 cm.??Pathologic Stage Classification (pTNM, AJCC 8th Edition): pT1c N0?     ? Carpal tunnel syndrome of right wrist 10/22/2016   ? Carpal tunnel syndrome of left wrist 10/09/2016     Added automatically from request for surgery [id number]     ? 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 Hypercalcemia 08/09/2016   ? Carpal tunnel syndrome, bilateral 08/06/2016   ? Abnormal CT of the head 08/06/2016   ? Cough 08/09/2014   ? Framingham cardiac risk <10% in next 10 years 08/09/2014   ? Medication side effects 05/18/2014   ? Bilateral edema of lower extremity 04/28/2014   ? Diastolic heart failure (HCC) 04/28/2014   ? Hyperlipemia 01/18/2010   ? Hypertension 07/21/2008   ? 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 Obesity 07/21/2008         Review of Systems   Constitutional: Positive for weight loss.   HENT: Negative.    Eyes: Positive for photophobia.   Cardiovascular: Positive for leg swelling.   Respiratory: Positive for cough.    Endocrine: Positive for polyuria.   Hematologic/Lymphatic: Negative.    Skin: Negative.    Musculoskeletal: Positive for arthritis, back pain, falls, muscle weakness and stiffness.   Gastrointestinal: Positive for change in bowel habit and melena.   Genitourinary: Positive for bladder incontinence, dysuria, incomplete emptying and nocturia.   Neurological: Positive for excessive daytime sleepiness, light-headedness, loss of balance and vertigo.   Psychiatric/Behavioral: Positive for depression.   Allergic/Immunologic: Positive for environmental allergies.       Physical Exam  General Appearance: obese  Skin: warm, moist, no ulcers or xanthomas  Eyes: conjunctivae and lids normal, pupils are equal and round  Lips & Oral Mucosa: no pallor or cyanosis  Neck Veins: neck veins are flat, neck veins are not distended  Chest Inspection: chest is normal in appearance  Respiratory Effort: breathing comfortably, no respiratory distress  Auscultation/Percussion: lungs clear to auscultation, no rales or rhonchi, no wheezing  Cardiac Rhythm: regular rhythm and normal rate  Cardiac Auscultation: S1, S2 normal, no rub, no gallop  Murmurs: no murmur  Carotid Arteries: normal carotid upstroke bilaterally, no bruit  Abdominal aorta: could not be examined due to obese adomen  Lower Extremity Edema: 1-2+ bilateral mildly ankle edema  Abdominal Exam: soft, non-tender, no masses, bowel sounds normal  Liver & Spleen: no organomegaly  Language and Memory: patient responsive and seems to comprehend information  Neurologic Exam: neurological assessment grossly intact    Cardiovascular Studies      Cardiovascular Health Factors  Vitals BP Readings from Last 3 Encounters:   12/07/20 (!) 156/90   08/16/20 129/75   07/05/20 (!) 176/82     Wt Readings from Last 3 Encounters:   12/07/20 111.9 kg (246 lb 12.8 oz)   08/16/20 112.5 kg (248 lb)   07/05/20 115.3 kg (254 lb 3.2 oz)     BMI Readings from Last 3 Encounters:   12/07/20 42.36 kg/m?   08/16/20 42.57 kg/m?   07/05/20 43.63 kg/m?      Smoking Social History     Tobacco Use   Smoking Status Former Smoker   ? Quit date: 03/04/1989   ? Years since quitting: 31.7   Smokeless Tobacco Never Used   Tobacco Comment    quit in 1991      Lipid Profile Cholesterol   Date Value Ref Range Status   09/02/2019 161 <200 MG/DL  Final     HDL   Date Value Ref Range Status   09/02/2019 48 >40 MG/DL Final     LDL   Date Value Ref Range Status   09/02/2019 92 <100 mg/dL Final     Triglycerides   Date Value Ref Range Status   09/02/2019 96 <150 MG/DL Final      Blood Sugar Hemoglobin A1C   Date Value Ref Range Status   05/25/2020 6.3 (H) 4.0 - 6.0 % Final     Comment:     The ADA recommends that most patients with type 1 and type 2 diabetes maintain   an A1c level <7%.       Glucose   Date Value Ref Range Status   11/08/2020 100 70 - 100 MG/DL Final   16/12/9602 540 (H) 70 - 100 MG/DL Final   98/01/9146 829 (H) 70 - 100 MG/DL Final     Glucose, POC   Date Value Ref Range Status   01/03/2017 138 (H) 70 - 100 MG/DL Final          Problems Addressed Today  Encounter Diagnoses   Name Primary?   ? Chronic diastolic heart failure (HCC) Yes   ? Mixed hyperlipidemia    ? Primary hypertension    ? Pericardial effusion    ? Carcinoid tumor of left lung    ? Medication side effects    ? OSA (obstructive sleep apnea)    ? Pneumonia due to COVID-19 virus    ? History of COVID-19        Assessment and Plan     Assessment:    1.  HFpEF-this is multifactorial and perhaps secondary to suboptimally controlled hypertension, obesity and chronic diastolic dysfunction  2.  Primary hypertension-suboptimally controlled  3. Chronic, bilateral lower extremity edema-today on the physical exam mostly ankle edema was present, patient is compliant with a strict low-sodium diet  4.  Obesity-current BMI is 42.36 kg/m?  5.  History of COVID-19 pneumonia-recovered  6.  History of pericardial effusion-this was detected on an echocardiogram performed in March 2021  7.  Status post pericardiocentesis  8.  History of lung cancer-patient did undergo surgical intervention  9.  Family history of premature CAD- patient underwent previous stress test evaluation, she was not found to have any ischemia on the tomographic pattern, normal LVEF    Plan:    1.  Discontinue furosemide  2.  Start torsemide 20 mg p.o.-I did asked the patient to take this Monday Wednesday Friday and perhaps in between if she notices lower extremity edema  3.  Repeat limited 2D echo study to check for LV function and recurrent pleural effusion-we will follow-up on the results of this test and will call the patient with further recommendations  4.  Follow-up office visit in 6 to 8 months    Total Time Today was 40 minutes in the following activities: Preparing to see the patient, Obtaining and/or reviewing separately obtained history, Performing a medically appropriate examination and/or evaluation, Counseling and educating the patient/family/caregiver, Ordering medications, tests, or procedures, Referring and communication with other health care professionals (when not separately reported), Documenting clinical information in the electronic or other health record, Independently interpreting results (not separately reported) and communicating results to the patient/family/caregiver and Care coordination (not separately reported)          Current Medications (including today's revisions)  ? acetaminophen (TYLENOL) 325 mg tablet Take two tablets by mouth every 6 hours as needed.  For fever or pain.   ? ascorbic acid (VITAMIN C) 500 mg tablet Take 500 mg by mouth daily.   ? atorvastatin (LIPITOR) 10 mg tablet TAKE 1 TABLET BY MOUTH EVERY DAY   ? carvediloL (COREG) 3.125 mg tablet Take one tablet by mouth twice daily with meals. Take with food.   ? cetirizine (ZYRTEC) 10 mg tablet Take 10 mg by mouth daily.   ? cholecalciferol (VITAMIN D-3) 1,000 units tablet Take 1,000 Units by mouth daily.   ? fexofenadine HCl (MUCINEX ALLERGY PO) Take 1 tablet by mouth daily.   ? furosemide (LASIX) 40 mg tablet TAKE ONE TABLET BY MOUTH DAILY AS NEEDED.   ? gabapentin (NEURONTIN) 100 mg capsule TAKE 2 CAPSULES BY MOUTH TWICE A DAY   ? glucosamine/msm/chondrt/C/hyal (GLUCOSAMINE-CHONDROITIN-MSM PO) Take 1 tablet by mouth daily.   ? levothyroxine (SYNTHROID) 88 mcg tablet TAKE 1 TABLET BY MOUTH EVERY DAY IN THE MORNING   ? losartan (COZAAR) 50 mg tablet TAKE 1 TABLET BY MOUTH TWICE A DAY   ? melatonin 10 mg cap Take 10 mg by mouth daily.   ? meloxicam (MOBIC) 15 mg tablet Take one tablet by mouth daily as needed for Pain.   ? metFORMIN (GLUCOPHAGE) 500 mg tablet TAKE 1 TAB BY MOUTH ONCE DAILY FOR 2 WEEKS THEN INCREASE TO TWICE DAILY AFTER MEALS   ? oxybutynin XL (DITROPAN XL) 10 mg tablet TAKE 1 TABLET BY MOUTH DAILY   ? pantoprazole DR (PROTONIX) 40 mg tablet TAKE 1 TABLET BY MOUTH TWICE A DAY   ? polycarbophil (FIBERCON) 625 mg tablet Take 625 mg by mouth twice daily.   ? Psyllium Husk 0.52 gram cap Take 1 capsule by mouth twice daily.   ? QUERCETIN DIHYDRATE (BULK) MISC Take 1 tablet by mouth daily.   ? simethicone (MYLICON) 80 mg chew tablet as Needed.   ? vit C,E-Zn-coppr-lutein-zeaxan (PRESERVISION AREDS-2) 250-200-40-1 mg-unit-mg-mg cap Take 1 capsule by mouth twice daily.   ? Zinc 50 mg tab Take 1 tablet by mouth daily.

## 2020-12-08 ENCOUNTER — Encounter: Admit: 2020-12-08 | Discharge: 2020-12-08 | Payer: MEDICARE

## 2020-12-11 ENCOUNTER — Encounter: Admit: 2020-12-11 | Discharge: 2020-12-11 | Payer: MEDICARE

## 2020-12-11 ENCOUNTER — Ambulatory Visit: Admit: 2020-12-11 | Discharge: 2020-12-11 | Payer: MEDICARE

## 2020-12-11 DIAGNOSIS — I5032 Chronic diastolic (congestive) heart failure: Secondary | ICD-10-CM

## 2020-12-11 DIAGNOSIS — I10 Essential (primary) hypertension: Secondary | ICD-10-CM

## 2020-12-11 DIAGNOSIS — E785 Hyperlipidemia, unspecified: Secondary | ICD-10-CM

## 2020-12-11 NOTE — Telephone Encounter
Called and discussed results with patient.  No questions at this time.  Pt will callback with any questions, concerns or problems.  Faxed stress test order to [facility].

## 2020-12-11 NOTE — Telephone Encounter
Left message requesting callback to discuss results.

## 2020-12-11 NOTE — Telephone Encounter
-----   Message from Vertell Novak, MD sent at 12/11/2020  3:04 PM CDT -----  Please let the patient know that the echocardiogram did not show any pericardial effusion.    However, the heart function came back on the study as being a bit abnormal, 45%, below the lower limits of normal (please explain to the patient that the lower limits is 50).    I like her to get stress test evaluation, regadenoson MPI, we can do this test in [place] that I will follow-up on the results of the test and will be in touch with her, probably she can even come back and see me in the office next time I will be in [place].    Thank you      ----- Message -----  From: Eugene Gavia, MD  Sent: 12/11/2020   1:55 PM CDT  To: Vertell Novak, MD

## 2020-12-18 ENCOUNTER — Encounter: Admit: 2020-12-18 | Discharge: 2020-12-18 | Payer: MEDICARE

## 2020-12-18 ENCOUNTER — Ambulatory Visit: Admit: 2020-12-18 | Discharge: 2020-12-18 | Payer: MEDICARE

## 2020-12-18 DIAGNOSIS — I10 Essential (primary) hypertension: Secondary | ICD-10-CM

## 2020-12-18 DIAGNOSIS — I5032 Chronic diastolic (congestive) heart failure: Secondary | ICD-10-CM

## 2020-12-18 MED ORDER — NITROGLYCERIN 0.4 MG SL SUBL
.4 mg | SUBLINGUAL | 0 refills | Status: AC | PRN
Start: 2020-12-18 — End: ?

## 2020-12-18 MED ORDER — ALBUTEROL SULFATE 90 MCG/ACTUATION IN HFAA
2 | RESPIRATORY_TRACT | 0 refills | Status: AC | PRN
Start: 2020-12-18 — End: ?

## 2020-12-18 MED ORDER — AMINOPHYLLINE 500 MG/20 ML IV SOLN
50 mg | INTRAVENOUS | 0 refills | Status: AC | PRN
Start: 2020-12-18 — End: ?

## 2020-12-18 MED ORDER — REGADENOSON 0.4 MG/5 ML IV SYRG
.4 mg | Freq: Once | INTRAVENOUS | 0 refills | Status: CP
Start: 2020-12-18 — End: ?

## 2020-12-18 MED ORDER — RP DX TC-99M TETROFOSMIN MCI
10 | Freq: Once | INTRAVENOUS | 0 refills | Status: CP
Start: 2020-12-18 — End: ?

## 2020-12-18 MED ORDER — RP DX TC-99M TETROFOSMIN MCI
28 | Freq: Once | INTRAVENOUS | 0 refills | Status: CP
Start: 2020-12-18 — End: ?

## 2020-12-18 MED ORDER — SODIUM CHLORIDE 0.9 % IV SOLP
250 mL | INTRAVENOUS | 0 refills | Status: AC | PRN
Start: 2020-12-18 — End: ?

## 2020-12-18 MED ORDER — EUCALYPTUS-MENTHOL MM LOZG
1 | Freq: Once | ORAL | 0 refills | Status: AC | PRN
Start: 2020-12-18 — End: ?

## 2020-12-20 ENCOUNTER — Ambulatory Visit: Admit: 2020-12-20 | Discharge: 2020-12-20 | Payer: MEDICARE

## 2020-12-20 ENCOUNTER — Encounter: Admit: 2020-12-20 | Discharge: 2020-12-20 | Payer: MEDICARE

## 2020-12-20 DIAGNOSIS — K929 Disease of digestive system, unspecified: Secondary | ICD-10-CM

## 2020-12-20 DIAGNOSIS — Z86010 Personal history of colonic polyps: Secondary | ICD-10-CM

## 2020-12-20 DIAGNOSIS — R6884 Jaw pain: Secondary | ICD-10-CM

## 2020-12-20 DIAGNOSIS — E079 Disorder of thyroid, unspecified: Secondary | ICD-10-CM

## 2020-12-20 DIAGNOSIS — M539 Dorsopathy, unspecified: Secondary | ICD-10-CM

## 2020-12-20 DIAGNOSIS — F99 Mental disorder, not otherwise specified: Secondary | ICD-10-CM

## 2020-12-20 DIAGNOSIS — C801 Malignant (primary) neoplasm, unspecified: Secondary | ICD-10-CM

## 2020-12-20 DIAGNOSIS — J189 Pneumonia, unspecified organism: Secondary | ICD-10-CM

## 2020-12-20 DIAGNOSIS — R7309 Other abnormal glucose: Secondary | ICD-10-CM

## 2020-12-20 DIAGNOSIS — Z0000 Encounter for general adult medical examination without abnormal findings: Principal | ICD-10-CM

## 2020-12-20 DIAGNOSIS — N393 Stress incontinence (female) (male): Secondary | ICD-10-CM

## 2020-12-20 DIAGNOSIS — M1990 Unspecified osteoarthritis, unspecified site: Secondary | ICD-10-CM

## 2020-12-20 DIAGNOSIS — E785 Hyperlipidemia, unspecified: Secondary | ICD-10-CM

## 2020-12-20 DIAGNOSIS — I10 Essential (primary) hypertension: Secondary | ICD-10-CM

## 2020-12-20 DIAGNOSIS — E782 Mixed hyperlipidemia: Secondary | ICD-10-CM

## 2020-12-20 DIAGNOSIS — G5603 Carpal tunnel syndrome, bilateral upper limbs: Secondary | ICD-10-CM

## 2020-12-20 NOTE — Progress Notes
Date of Service: 12/20/2020    Alicia Fox is a 70 y.o. female.  DOB: Apr 19, 1950  MRN: [id number]   she was last seen by me 05/25/2020      Subjective:            She presents today for an Annual Medicare Wellness visit. She was last seen by me 05/25/2020.  Patient overall feels okay.  She does have swelling in her legs that has been ongoing and she is working with cardiology to improve this.  Patient switched from furosemide to torsemide but is not sure this is making a difference.  She also was planning to lose weight and has lost weight over the past 7 weeks.  Patient complains of ongoing back pain and has not started physical therapy yet.  She request that the referral be sent to [facility] again.  Patient also saw urology and it was recommended that she start pelvic floor therapy.  Patient is agreeable to this.  Also her oxybutynin was increased to twice daily.  Patient still feels that she is dry and thirsty throughout the day and drinks a lot of liquids throughout the day.  She is up-to-date on age-appropriate screening.  Patient also complains of the bottom middle teeth being pushed in since she has been wearing the CPAP for a few years.  She states she would like to see somebody about this to see if anything can be done to prevent it from further progressing.        Chief Complaint   Patient presents with   ? Annual Wellness Visit     Medicare [id number]/Non fasting labs/No pap/Depression=2   ? Diabetes     Last A1c=6.3 on 05/25/2020   ? Cholesterol     Lipitor 10mg    ? Hypertension       Medical History:   Diagnosis Date   ? Arthritis     lumbar, knees   ? Cancer [facility])     breast   ? Carpal tunnel syndrome on both sides 2018    L > R   ? Disorder of thyroid gland     hypothyroid   ? Gastrointestinal disorder     gerd at times-under control with meds   ? HTN (hypertension)    ? Hx of adenomatous colonic polyps    ? Hyperlipidemia    ? Pneumonia     bilateral bacterial   ? Psychiatric illness     depression Surgical History:   Procedure Laterality Date   ? HX MASTECTOMY Bilateral 1986    1986-stomberg mastectomy   ? BREAST BIOPSY  07/1984   ? MASTECTOMY, PARTIAL Bilateral 12/1984   ? MASTECTOMY, MODIFIED RADICAL Bilateral 1987    1987-full modified   ? BREAST SURGERY Bilateral 07/1985    implants   ? HX DILATION AND CURETTAGE  07/1994   ? HX HYSTERECTOMY  10/1994    complete   ? HX ABDOMINOPLASTY  10/1994   ? HIP SURGERY  02/1995   ? HX LIPOMA RESECTION  11/1999    outer upper left arm   ? ANUS SURGERY  01/2010    anal fissure   ? HX LIPOMA RESECTION  02/2010    inside upper right arm   ? LEFT DECOMPRESSION NERVE MEDIAN WITH CARPAL TUNNEL RELEASE Left 10/17/2016    Performed by Delice Lesch, MD at [facility] OR   ? BRONCHOSCOPY N/A 01/13/2017    Performed by Audie Box, MD at [facility] OR   ?  BRONCHOSCOPY WITH TRANSBRONCHIAL BIOPSY N/A 01/13/2017    Performed by Audie Box, MD at [facility] OR   ? BRONCHOSCOPY WITH ENDOBRONCHIAL ULTRASOUND GUIDED TRANSTRACHEAL/ TRANSBRONCHIAL SAMPLING - 3 OR MORE MEDIASTINAL/ HILAR LYMPH NODE STATIONS/ STRUCTURE - RIGID N/A 01/13/2017    Performed by Audie Box, MD at [facility] OR   ? BRONCHOSCOPY WITH IMAGE - GUIDED NAVIGATION - FLEXIBLE N/A 01/13/2017    Performed by Audie Box, MD at [facility] OR   ? ROBOTIC LEFT LOWER LOBECTOMY Left 03/21/2017    Performed by Particia Nearing, MD at [facility] CVOR   ? Colonoscopy N/A 10/01/2019    Performed by Meyer Cory, MD at [facility] ENDO   ? ESOPHAGOGASTRODUODENOSCOPY WITH SPECIMEN COLLECTION BY BRUSHING/ WASHING N/A 10/01/2019    Performed by Meyer Cory, MD at [facility] ENDO   ? ESOPHAGOGASTRODUODENOSCOPY WITH SNARE REMOVAL TUMOR/ POLYP/ OTHER LESION - FLEXIBLE N/A 10/01/2019    Performed by Meyer Cory, MD at [facility] ENDO   ? ESOPHAGOGASTRODUODENOSCOPY WITH BIOPSY - FLEXIBLE N/A 10/01/2019    Performed by Meyer Cory, MD at [facility] ENDO   ? COLONOSCOPY WITH SNARE REMOVAL TUMOR/ POLYP/ OTHER LESION  10/01/2019    Performed by Meyer Cory, MD at [facility] ENDO   ? SPINE SURGERY  1993 and 1996 cervical fusion     Family History   Problem Relation Age of Onset   ? Complete Hysterectomy Mother 35        uterine prolaps   ? Hypertension Mother    ? Atrial Fibrillation Mother    ? Hypertension Father    ? COPD Father    ? Heart Failure Father    ? Kidney Cancer Brother 67   ? Cancer-Prostate Brother 60   ? Intellectual Disability Brother    ? Heart Attack Paternal Uncle    ? Cancer-Hematologic Maternal Grandfather         leukemia   ? Cancer-Breast Paternal Grandmother 71   ? Cancer-Breast Maternal Aunt 17     Social History     Socioeconomic History   ? Marital status: Widowed   Occupational History   ? Occupation: retired   Tobacco Use   ? Smoking status: Former Smoker     Quit date: 03/04/1989     Years since quitting: 31.8   ? Smokeless tobacco: Never Used   ? Tobacco comment: quit in 1991   Substance and Sexual Activity   ? Alcohol use: Not Currently   ? Drug use: No      I reviewed medications, allergies, problem list and tobacco history at this visit.    A Health Risk assessment was performed by the patient today & reviewed with the patient.  Health Risk Assessment Questionnaire  Current Care  List of Providers you have seen in the last two years: Nickolas Madrid cardiologist ;Pennie Banter urologist ;Phineas Real Surgeon lung cancer;Stevens-CPAP ;Claudie Revering Somo colonoscopy ;Physiatrist? ;Dietitian referral by Larina Bras ;Social worker referral by Larina Bras  Are you receiving home health?: No  During the past 4 weeks, how would you rate your health in general?: (!) Fair    Outside Care  Since your last PCP visit, have you received care outside of [facility]?: No        Physical Activity  Do you exercise or are you physically active?: (!) No          Diet  In the past month, were you worried whether your food would run out before you or your family had  money to buy more?: No  In the past 7 days, how many times did you eat fast food or junk food or pizza?: 0  In the past 7 days, how many servings of fruits or vegetables did you eat each day?: 4-5  In the past 7 days, how many sodas and sugar sweetened drinks (regular, not diet) did you drink each day?: 0    Smoke/Tobacco Use  Are you currently a smoker?: No      Alcohol Use  Do you drink alcohol?: No          Depression Screen  Little interest or pleasure in doing things: (!) Several Days  Feeling down, depressed or hopeless: (!) Several Days  Patient Scores:  PHQ-2: PHQ-2 Score: 2 (12/20/2020 10:51 AM)    PHQ-9: PHQ-9 Score: 2 (12/20/2020 10:51 AM)    Interventions:  PHQ-2: PHQ-2 Score less than 3: No follow-up or recommendations are necessary at this time (12/07/2020 10:59 AM)    Depression Interventions PHQ-2/9: No data recorded      Pain  How would you rate your pain today?: (!) Mild pain    Ambulation  Do you use any assistive devices for ambulation?: (!) Yes  What types of device? (select all that apply): Cane    Fall Risk  Does it take you longer than 30 seconds to get up and out of a chair?: No  Have you fallen in the past year?: (!) Yes  Fall History (last 82mo): No Falls    Motor Vehicle Safety  Do you fasten your seat belt when you are in the car?: Yes    Sun Exposure  Do you protect yourself from the sun? For example, wear sunscreen when outside.: (!) No    Hearing Loss  Do you have trouble hearing the television or radio when others do not?: No  Do you have to strain or struggle to hear/understand conversation?: No  Do you use hearing aids?: No    Cognitive Impairment  During the past 12 months, have you experienced confusion or memory loss that is happening more often or is getting worse?: No    Functional Screen  Do you live alone?: No  Do you live at: Home  Can you drive your own car or travel alone by bus or taxi?: Yes  Can you shop for groceries or clothes without help?: Yes  Can you prepare your own meals?: Yes  Can you do your own housework without help?: Yes  Can you handle your own money without help?: Yes  Do you need help eating, bathing, dressing, or getting around your home?: No  Do you feel safe?: Yes  Does anyone at home hurt you, hit you, or threaten you?: No  Have you ever been the victim of abuse?: No    Home Safety  Does your home have grab bars in the bathroom?: (!) No  Does your home have hand rails on stairs and steps?: Yes  Does your home have functioning smoke alarms?: Yes    Advance Directive  Do you have a living will or Advance Directive?: Yes      Dental Screen  Have you had an exam by your dentist in the last year?: (!) No    Vision Screen  Do you have diabetes?: No          **DO NOT USE / HISTORICAL DATA** In the last 12 months, did you ever eat less than you should because there wasn't enough money for  food?: No (05/21/2020  8:45 PM)  In the last 12 months, has your utility company shut off your service for not paying your bills?: No (05/21/2020  8:45 PM)  Are you worried that in the next 2 months, you may not have stable housing?: No (05/21/2020  8:45 PM)  Are you afraid that you may be hurt in your home by someone you know?: No (05/21/2020  8:45 PM)  Are you afraid you might be hurt in your apartment building or neighborhood?: No (05/21/2020  8:45 PM)  Do problems getting childcare make it difficult to work or study?: No (05/21/2020  8:45 PM)  In the last 12 months, have you needed to see a doctor, but could not because of cost?: No (05/21/2020  8:45 PM)  In the last 12 months, did you skip medications to save money?: No (05/21/2020  8:45 PM)  In the past 12 months, have you ever gone without  health care because you didn't have a way to get there?: No (05/21/2020  8:45 PM)  Do you have problems understanding what is told to you about your medical conditions?: No (05/21/2020  8:45 PM)  Do you often feel that you lack companionship?: No (05/21/2020  8:45 PM)  If you answered yes to any questions above, would you like to discuss help with your social work team?: No (05/21/2020  8:45 PM)        Personal prevention Plan reviewed with patient.    While the patient was here today, due to his/her multiple chronic conditions it would be in the best interest of the patient for me to monitor, assess and evaluate those as well. They are as follows.  Patient Active Problem List    Diagnosis Date Noted   ? History of COVID-19 05/13/2019     Priority: Medium     Overview Note:     She developed COVID in 12/20 with week long hospitalization and need for oxygen/steroids but not sent home on oxygen.  She apparently had pericardial effusion also as complication but no recurrence since single pericardiocentesis in March 2021.      ? Stress incontinence of urine 12/20/2020   ? 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 Other insomnia 08/16/2020   ? Elevated hemoglobin A1c 06/01/2020   ? 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 Hooded upper eyelid 08/04/2019   ? COVID-19 vaccine administered 07/15/2019   ? Other headache syndrome 05/28/2019   ? COVID-19 vaccine administered 05/28/2019   ? Pericardial effusion 05/11/2019   ? Shortness of breath with exposure to severe acute respiratory syndrome coronavirus 2 (SARS-CoV-2) 04/05/2019   ? Morbid obesity (HCC) 02/18/2019   ? Chest wall pain 02/18/2019   ? Pneumonia due to COVID-19 virus 02/15/2019   ? COVID-19 02/05/2019   ? OSA (obstructive sleep apnea) 02/17/2018     Overview Note:     Split night Pap titration dated 10/21/2017 at the  sleep lab showed severe obstructive sleep apnea with AHI of 105.6 events per hour successful CPAP titration 12 cmH2O using full facemask elevated frequency of.  Limb movement with index of 30.8 events per hour and nocturnal hypoxemia with oxygen desaturation nadir of 77% oxygen saturation below 88% for about 99.1 minutes    Pt set up with CPAP @ 12cm H2O on 12/04/2017.  DME: [name]     ? 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 Particia Nearing. ?Pathology impression: ?Lung: Typical carcinoid tumor. ??Greatest dimension (centimeters): 3.0 cm.?Additional dimensions (centimeters): 2.5 x 2.5 cm.??Pathologic Stage Classification (pTNM, AJCC 8th Edition): pT1c N0?     ? Carpal tunnel syndrome of right wrist 10/22/2016   ? Carpal tunnel syndrome of left wrist 10/09/2016     Overview Note:     Added automatically from request for surgery [id number]     ? 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 Hypercalcemia 08/09/2016   ? Carpal tunnel syndrome, bilateral 08/06/2016   ? Abnormal CT of the head 08/06/2016   ? Cough 08/09/2014   ? Framingham cardiac risk <10% in next 10 years 08/09/2014   ? Medication side effects 05/18/2014   ? Bilateral edema of lower extremity 04/28/2014   ? Diastolic heart failure (HCC) 04/28/2014   ? Hyperlipemia 01/18/2010   ? Hypertension 07/21/2008   ? 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 Obesity 07/21/2008     Other concerns addressed at this visit -  Low back pain, urinary incontinence    Records requested at the time of this visit:No    Prior consultations, labs, radiology reports reviewed at the time of this visit.Yes: Most recent labs, most recent urology visit, most recent pulmonary visit, echo      BMI:  Body mass index is 44.16 kg/m?Marland Kitchen  High BMI: Counseled regarding weight loss (12/20/2020 10:51 AM)    Wt Readings from Last 10 Encounters:   12/20/20 109.5 kg (241 lb 8 oz)   12/11/20 111 kg (244 lb 11.4 oz)   12/07/20 111.9 kg (246 lb 12.8 oz)   08/16/20 112.5 kg (248 lb)   07/05/20 115.3 kg (254 lb 3.2 oz)   06/01/20 110 kg (242 lb 6.4 oz)   05/25/20 111.6 kg (246 lb)   03/14/20 107.1 kg (236 lb 3.2 oz)   02/29/20 105.7 kg (233 lb)   10/05/19 106.2 kg (234 lb 3.2 oz)            Review of Systems   Cardiovascular: Positive for leg swelling.   Skin: Positive for dry skin.   Musculoskeletal: Positive for back pain, joint pain and myalgias.   Gastrointestinal: Positive for constipation.   Neurological: Positive for dizziness and vertigo.           Objective:         ? acetaminophen (TYLENOL) 325 mg tablet Take two tablets by mouth every 6 hours as needed. For fever or pain.   ? ascorbic acid (VITAMIN C) 500 mg tablet Take 500 mg by mouth daily.   ? atorvastatin (LIPITOR) 10 mg tablet TAKE 1 TABLET BY MOUTH EVERY DAY   ? carvediloL (COREG) 3.125 mg tablet Take one tablet by mouth twice daily with meals. Take with food.   ? cetirizine (ZYRTEC) 10 mg tablet Take 10 mg by mouth daily.   ? cholecalciferol (VITAMIN D-3) 1,000 units tablet Take 1,000 Units by mouth daily.   ? fexofenadine HCl (MUCINEX ALLERGY PO) Take 1 tablet by mouth daily.   ? gabapentin (NEURONTIN) 100 mg capsule TAKE 2 CAPSULES BY MOUTH TWICE A DAY   ? glucosamine/msm/chondrt/C/hyal (GLUCOSAMINE-CHONDROITIN-MSM PO) Take 1 tablet by mouth daily.   ? levothyroxine (SYNTHROID) 88 mcg tablet TAKE 1 TABLET BY MOUTH EVERY DAY IN THE MORNING   ? losartan (COZAAR) 50 mg tablet TAKE 1 TABLET BY MOUTH TWICE A DAY   ? melatonin  10 mg cap Take 10 mg by mouth daily.   ? meloxicam (MOBIC) 15 mg tablet Take one tablet by mouth daily as needed for Pain.   ? metFORMIN (GLUCOPHAGE) 500 mg tablet TAKE 1 TAB BY MOUTH ONCE DAILY FOR 2 WEEKS THEN INCREASE TO TWICE DAILY AFTER MEALS   ? oxybutynin XL (DITROPAN XL) 10 mg tablet Take one tablet by mouth twice daily. Do not cut/ crush/ chew   ? pantoprazole DR (PROTONIX) 40 mg tablet TAKE 1 TABLET BY MOUTH TWICE A DAY   ? polycarbophil (FIBERCON) 625 mg tablet Take 625 mg by mouth twice daily.   ? Psyllium Husk 0.52 gram cap Take 1 capsule by mouth twice daily.   ? QUERCETIN DIHYDRATE (BULK) MISC Take 1 tablet by mouth daily.   ? torsemide (DEMADEX) 20 mg tablet Take one tablet by mouth daily. Indications: visible water retention   ? vit C,E-Zn-coppr-lutein-zeaxan (PRESERVISION AREDS-2) 250-200-40-1 mg-unit-mg-mg cap Take 1 capsule by mouth twice daily.   ? Zinc 50 mg tab Take 1 tablet by mouth daily.     Vitals:    12/20/20 1049   BP: 130/88   Pulse: 76   Temp: 36.7 ?C (98 ?F)   Resp: 18   PainSc: Zero   Weight: 109.5 kg (241 lb 8 oz)   Height: 157.5 cm (5' 2.01)     Vitals:    12/20/20 1049   BP: 130/88   Patient Position: Sitting   Pulse: 76         Body mass index is 44.16 kg/m?Marland Kitchen     Physical Exam  Vitals and nursing note reviewed.   Constitutional:       Appearance: Normal appearance.   HENT:      Head: Normocephalic and atraumatic.      Right Ear: Tympanic membrane, ear canal and external ear normal.      Left Ear: Tympanic membrane, ear canal and external ear normal.      Nose: Nose normal.      Mouth/Throat:      Mouth: Mucous membranes are moist.      Pharynx: Oropharynx is clear.      Comments: 2 middle lower teeth pushed back from arc of teeth  Eyes:      Extraocular Movements: Extraocular movements intact.      Pupils: Pupils are equal, round, and reactive to light.   Neck:      Vascular: No carotid bruit.      Comments: No thyromegaly  Cardiovascular:      Rate and Rhythm: Normal rate and regular rhythm.      Pulses: Normal pulses.      Heart sounds: Normal heart sounds.   Pulmonary:      Effort: Pulmonary effort is normal.      Breath sounds: Normal breath sounds.   Musculoskeletal:      Cervical back: Neck supple.      Right lower leg: Edema (1+) present.      Left lower leg: Edema (1+) present.   Lymphadenopathy:      Cervical: No cervical adenopathy.   Skin:     General: Skin is warm and dry.      Findings: No rash.   Neurological:      General: No focal deficit present.      Mental Status: She is alert and oriented to person, place, and time.   Psychiatric:         Mood and Affect: Mood normal.  Behavior: Behavior normal.         Health Maintenance   Topic Date Due   ? DTAP/TDAP VACCINES (2 - Td or Tdap) 12/26/2020   ? BREAST CANCER SCREENING  08/24/2021   ? PHYSICAL (COMPREHENSIVE) EXAM  12/20/2021   ? MEDICARE ANNUAL WELLNESS VISIT  12/20/2021   ? COLORECTAL CANCER SCREENING 09/30/2029   ? OSTEOPOROSIS SCREENING/MONITORING  Completed   ? COVID-19 VACCINE  Completed   ? SHINGLES RECOMBINANT VACCINE  Completed   ? INFLUENZA VACCINE  Completed   ? PNEUMOCOCCAL VACCINE  Completed   ? HEPATITIS C SCREENING  Completed   ? DEPRESSION SCREENING  Completed   ? ADVANCED CARE PLANNING DISCUSSION AND DOCUMENTATION  Completed        The 10-year ASCVD risk score Denman George DC Montez Hageman., et al., 2013) is: 13.9%    Values used to calculate the score:      Age: 17 years      Sex: Female      Is Non-Hispanic African American: No      Diabetic: No      Tobacco smoker: No      Systolic Blood Pressure: 130 mmHg      Is BP treated: Yes      HDL Cholesterol: 48 MG/DL      Total Cholesterol: 161 MG/DL        Assessment and Plan:    Alicia Fox was seen today for annual wellness visit, diabetes, cholesterol and hypertension.    Diagnoses and all orders for this visit:    Encounter for Medicare annual wellness exam    Mandible pain  -     AMB REFERRAL TO ENT    Multilevel degenerative disc disease    Stress incontinence of urine    Patient is up-to-date on age-appropriate screening.  Reviewed labs.  We will add lipid profile and A1c.  Encouraged weight loss with a goal BMI of less than 40.  I think this can also improve patient's swelling.    Referred to [facility] for mandible pain and changes with wearing CPAP    Patient with multilevel degenerative disc disease.  Refer for therapy.  Mended exercise and weight loss as well.  Patient has had a recent decrease in meloxicam.  Okay to use Tylenol as needed    Patient has followed with urology for incontinence.  Continue oxybutynin twice daily and refer for pelvic floor therapy    Patient Instructions     Health Maintenance   Topic Date Due   ? DTAP/TDAP VACCINES (2 - Td or Tdap) 12/26/2020   ? BREAST CANCER SCREENING  08/24/2021   ? PHYSICAL (COMPREHENSIVE) EXAM  12/20/2021   ? MEDICARE ANNUAL WELLNESS VISIT  12/20/2021   ? COLORECTAL CANCER SCREENING  09/30/2029   ? OSTEOPOROSIS SCREENING/MONITORING  Completed   ? COVID-19 VACCINE  Completed   ? SHINGLES RECOMBINANT VACCINE  Completed   ? INFLUENZA VACCINE  Completed   ? PNEUMOCOCCAL VACCINE  Completed   ? HEPATITIS C SCREENING  Completed   ? DEPRESSION SCREENING  Completed   ? ADVANCED CARE PLANNING DISCUSSION AND DOCUMENTATION  Completed        Visit Disposition     Dispositions    ? Return in about 1 year (around 12/20/2021) for Annual Physical.          Future Appointments   Date Time Provider Department Center   01/11/2021  9:00 AM Toula Moos, MD [facility]   03/14/2021  1:45 PM Sherron Monday, MD [facility]     Return in about 1 year (around 12/20/2021) for Annual Physical.    I reviewed with the patient their current medications and specifically any new medications prescribed at the time of this visit and we reviewed the expected benefits and potential side effects. All questions are answered to the patient's satisfaction.    ? Health maintenance gaps were reviewed with the patient at the time of this visit.    ? I emphasized the importance of medication adherence.   ? The medical problems/diagnoses listed under assessment and plan were addressed at this visit and unless otherwise stated are adequately controlled.        Problem   Stress Incontinence of Urine

## 2020-12-20 NOTE — Patient Instructions
Health Maintenance   Topic Date Due    DTAP/TDAP VACCINES (2 - Td or Tdap) 12/26/2020    BREAST CANCER SCREENING  08/24/2021    PHYSICAL (COMPREHENSIVE) EXAM  12/20/2021    MEDICARE ANNUAL WELLNESS VISIT  12/20/2021    COLORECTAL CANCER SCREENING  09/30/2029    OSTEOPOROSIS SCREENING/MONITORING  Completed    COVID-19 VACCINE  Completed    SHINGLES RECOMBINANT VACCINE  Completed    INFLUENZA VACCINE  Completed    PNEUMOCOCCAL VACCINE  Completed    HEPATITIS C SCREENING  Completed    DEPRESSION SCREENING  Completed    ADVANCED CARE PLANNING DISCUSSION AND DOCUMENTATION  Completed

## 2020-12-25 ENCOUNTER — Encounter: Admit: 2020-12-25 | Discharge: 2020-12-25 | Payer: MEDICARE

## 2020-12-25 NOTE — Telephone Encounter
Stress test results discussed with patient and all questions answered.

## 2020-12-25 NOTE — Telephone Encounter
-----   Message from Vertell Novak, MD sent at 12/24/2020  3:55 PM CDT -----  Please call this patient when you have a chance and inform her that the stress test did not show any blockages.  Thank you!    ----- Message -----  From: Cornelious Bryant, MD  Sent: 12/19/2020   9:42 AM CDT  To: Vertell Novak, MD

## 2021-01-11 ENCOUNTER — Encounter: Admit: 2021-01-11 | Discharge: 2021-01-11 | Payer: MEDICARE

## 2021-01-11 DIAGNOSIS — E119 Type 2 diabetes mellitus without complications: Secondary | ICD-10-CM

## 2021-01-11 DIAGNOSIS — K929 Disease of digestive system, unspecified: Secondary | ICD-10-CM

## 2021-01-11 DIAGNOSIS — F99 Mental disorder, not otherwise specified: Secondary | ICD-10-CM

## 2021-01-11 DIAGNOSIS — G4733 Obstructive sleep apnea (adult) (pediatric): Secondary | ICD-10-CM

## 2021-01-11 DIAGNOSIS — Z86010 Personal history of colonic polyps: Secondary | ICD-10-CM

## 2021-01-11 DIAGNOSIS — G5603 Carpal tunnel syndrome, bilateral upper limbs: Secondary | ICD-10-CM

## 2021-01-11 DIAGNOSIS — C801 Malignant (primary) neoplasm, unspecified: Secondary | ICD-10-CM

## 2021-01-11 DIAGNOSIS — E785 Hyperlipidemia, unspecified: Secondary | ICD-10-CM

## 2021-01-11 DIAGNOSIS — M1990 Unspecified osteoarthritis, unspecified site: Secondary | ICD-10-CM

## 2021-01-11 DIAGNOSIS — J189 Pneumonia, unspecified organism: Secondary | ICD-10-CM

## 2021-01-11 DIAGNOSIS — I10 Essential (primary) hypertension: Secondary | ICD-10-CM

## 2021-01-11 DIAGNOSIS — E079 Disorder of thyroid, unspecified: Secondary | ICD-10-CM

## 2021-01-11 DIAGNOSIS — E8881 Metabolic syndrome: Secondary | ICD-10-CM

## 2021-01-11 MED ORDER — MOUNJARO 5 MG/0.5 ML SC PNIJ
5 mg | SUBCUTANEOUS | 0 refills | Status: AC
Start: 2021-01-11 — End: ?

## 2021-01-11 MED ORDER — MOUNJARO 2.5 MG/0.5 ML SC PNIJ
2.5 mg | SUBCUTANEOUS | 0 refills | Status: AC
Start: 2021-01-11 — End: ?

## 2021-01-11 NOTE — Patient Instructions
Based on your weight and height measurements today, your Body Mass Index is Body mass index is 44.92 kg/m.Marland Kitchen The higher your BMI, the higher your risk for health problems such as heart disease, high blood pressure, diabetes, and some cancers. Below we have included tips to help you achieve a healthy weight.    Aim for at least 5 servings of fresh fruits and vegetables daily    Choose foods that are:    - Rich in vegetables, fruit, whole grains, seafood, legumes, and nuts  - Lower in red and processed meat  - Low in sugar, sweetened foods/drinks, and highly processed.    Aim for a goal of at least 150 minutes (30 minutes 5 days/week) of moderate intensity physical activity.      Nutrition Goals - Resume tracking on LoseIt or MyFitnessPal. Calorie goal of 1200.  Activity Goals - begin chair exercise      You can find more information at these websites:  MissingBag.si  www.myplate.gov

## 2021-01-11 NOTE — Progress Notes
[facility]  Initial Visit    Date of Service: 01/11/2021  PCP: Orson Gear A  DOB: 1950-03-14  MRN#: [id number]    Alicia Fox is a 70 y.o. female.        History of Present Illness  Alicia Fox is a 70 y.o. female with  has a past medical history of Arthritis, Cancer (HCC), Carpal tunnel syndrome on both sides (2018), Disorder of thyroid gland, Gastrointestinal disorder, HTN (hypertension), adenomatous colonic polyps, Hyperlipidemia, Pneumonia, and Psychiatric illness. presenting for an initial discussion regarding obesity and weight management.    Weight history/ previous weight loss attempts: The patient states her weight has been up and down all of her adult life. Over the past 5-10 years, she had been more consistently between 250-280# until the past year when she lost 60# to a nadir of 223# with intensive tracking and portion control. She is discouraged though because she has gained back 40# in the past few months.    Previously she lost weight with fen-phen. This past year she was started on metformin without additional weight loss. She has a + FH of weight issues.    Current nutrition: She follows a very strict (700mg ) low Na diet. She drinks coffee, tea, and water. She tries to make healthy protein choices. She does not eat a lot of vegetables.    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    Overall Obesity Management Goals: <200    Past Medical History -  has a past medical history of Arthritis, Cancer (HCC), Carpal tunnel syndrome on both sides (2018), Disorder of thyroid gland, Gastrointestinal disorder, HTN (hypertension), adenomatous colonic polyps, Hyperlipidemia, Pneumonia, and Psychiatric illness.     Past Surgical History -  has a past surgical history that includes Breast surgery (Bilateral, 07/1985); dilation and curettage (07/1994); hysterectomy (10/1994); abdominoplasty (10/1994); lipoma resection (11/1999); Anus surgery (01/2010); lipoma resection (02/2010); mastectomy (Bilateral, 1986); Mastectomy, modified radical (Bilateral, 1987); Spine surgery (1993 and 1996); breast biopsy (07/1984); Mastectomy, partial (Bilateral, 12/1984); hip surgery (02/1995); bronchoscopy (N/A, 01/13/2017); bronchoscopy (N/A, 01/13/2017); bronchoscopy (N/A, 01/13/2017); bronchoscopy (N/A, 01/13/2017); carpal tunnel release (Left, 10/17/2016); Colonoscopy (N/A, 10/01/2019); Upper gastrointestinal endoscopy (N/A, 10/01/2019); Upper gastrointestinal endoscopy (N/A, 10/01/2019); Upper gastrointestinal endoscopy (N/A, 10/01/2019); and Colonoscopy (10/01/2019).     Objective:     Physical Exam  Constitutional: Well developed, well nourished, no distress   HENT: Normocephalic, atraumatic  Cardiac: RRR, no murmur  Respiratory: Normal effort, clear to auscultation, no respiratory distress  Neurologic: Alert  Psych: mood /affect normal. Behavior normal. Thought content normal. Judgement normal.    Medications-  ? acetaminophen (TYLENOL) 325 mg tablet Take two tablets by mouth every 6 hours as needed. For fever or pain.   ? ascorbic acid (VITAMIN C) 500 mg tablet Take 500 mg by mouth daily.   ? atorvastatin (LIPITOR) 10 mg tablet TAKE 1 TABLET BY MOUTH EVERY DAY   ? carvediloL (COREG) 3.125 mg tablet Take one tablet by mouth twice daily with meals. Take with food.   ? cetirizine (ZYRTEC) 10 mg tablet Take 10 mg by mouth daily.   ? cholecalciferol (VITAMIN D-3) 1,000 units tablet Take 1,000 Units by mouth daily.   ? COLLAGEN MISC Use 1 tablet as directed daily.   ?  fexofenadine HCl (MUCINEX ALLERGY PO) Take 1 tablet by mouth daily.   ? gabapentin (NEURONTIN) 100 mg capsule TAKE 2 CAPSULES BY MOUTH TWICE A DAY   ? glucosamine/msm/chondrt/C/hyal (GLUCOSAMINE-CHONDROITIN-MSM PO) Take 1 tablet by mouth daily.   ? levothyroxine (SYNTHROID) 88 mcg tablet TAKE 1 TABLET BY MOUTH EVERY DAY IN THE MORNING   ? losartan (COZAAR) 50 mg tablet TAKE 1 TABLET BY MOUTH TWICE A DAY   ? melatonin 10 mg cap Take 10 mg by mouth daily.   ? meloxicam (MOBIC) 15 mg tablet Take one tablet by mouth daily as needed for Pain. (Patient taking differently: Take 7.5 mg by mouth daily as needed for Pain.)   ? metFORMIN (GLUCOPHAGE) 500 mg tablet TAKE 1 TAB BY MOUTH ONCE DAILY FOR 2 WEEKS THEN INCREASE TO TWICE DAILY AFTER MEALS   ? oxybutynin XL (DITROPAN XL) 10 mg tablet Take one tablet by mouth twice daily. Do not cut/ crush/ chew   ? pantoprazole DR (PROTONIX) 40 mg tablet TAKE 1 TABLET BY MOUTH TWICE A DAY   ? polycarbophil (FIBERCON) 625 mg tablet Take 625 mg by mouth twice daily.   ? polyethylene glycol 3350 (MIRALAX) 17 g packet Take 17 g by mouth daily as needed.   ? Psyllium Husk 0.52 gram cap Take 1 capsule by mouth twice daily.   ? QUERCETIN DIHYDRATE (BULK) MISC Take 1 tablet by mouth daily.   ? torsemide (DEMADEX) 20 mg tablet Take one tablet by mouth daily. Indications: visible water retention   ? vit C,E-Zn-coppr-lutein-zeaxan (PRESERVISION AREDS-2) 250-200-40-1 mg-unit-mg-mg cap Take 1 capsule by mouth twice daily.   ? Zinc 50 mg tab Take 1 tablet by mouth daily.     Recent Labs:  Basic Metabolic Profile    Lab Results   Component Value Date/Time    NA 142 11/08/2020 01:32 PM    K 4.7 11/08/2020 01:32 PM    CA 10.7 (H) 11/08/2020 01:32 PM    CL 106 11/08/2020 01:32 PM    CO2 27 11/08/2020 01:32 PM    GAP 9 11/08/2020 01:32 PM    Lab Results   Component Value Date/Time    BUN 11 11/08/2020 01:32 PM    CR 0.55 11/08/2020 01:32 PM    GLU 100 11/08/2020 01:32 PM        Hepatic Function    Lab Results   Component Value Date/Time    ALBUMIN 4.3 11/08/2020 01:32 PM    TOTPROT 6.7 11/08/2020 01:32 PM    ALKPHOS 117 (H) 11/08/2020 01:32 PM    Lab Results   Component Value Date/Time    AST 14 11/08/2020 01:32 PM    ALT 15 11/08/2020 01:32 PM    TOTBILI 0.5 11/08/2020 01:32 PM        TSH   Lab Results   Component Value Date/Time    TSH 1.10 05/25/2020 03:24 PM      Lab Draw:  Lab Results   Component Value Date/Time    HGBA1C 6.3 (H) 05/25/2020 03:24 PM    HGBA1C 6.5 (H) 09/02/2019 11:20 AM     POC:  No results found for: A1C   Lab Results   Component Value Date    CHOL 161 09/02/2019    TRIG 96 09/02/2019    HDL 48 09/02/2019    LDL 92 09/02/2019    VLDL 19 09/02/2019    NONHDLCHOL 113 09/02/2019    CHOLHDLC 3 02/23/2018      Wt Readings from Last  5 Encounters:   01/11/21 111.4 kg (245 lb 9.6 oz)   12/20/20 109.5 kg (241 lb 8 oz)   12/11/20 111 kg (244 lb 11.4 oz)   12/07/20 111.9 kg (246 lb 12.8 oz)   08/16/20 112.5 kg (248 lb)     BP Readings from Last 3 Encounters:   01/11/21 (!) 163/72   12/20/20 130/88   12/11/20 (!) 148/84     Vitals:    01/11/21 0851   BP: (!) 163/72   BP Source: Arm, Left Upper   Pulse: 71   Resp: 18   PainSc: Zero   Weight: 111.4 kg (245 lb 9.6 oz)   Height: 157.5 cm (5' 2)      Body mass index is 44.92 kg/m?Marland Kitchen       Assessment and Plan:  Obesity-   Initial weight & BMI:    Wt Readings from Last 1 Encounters:   01/11/21 111.4 kg (245 lb 9.6 oz)   Body mass index is 44.92 kg/m?.  01/11/21    Treatment:  Plan/ level of monitoring: Nutrition Counseling and Pharmacotherapy  Nutrition Goals - Resume tracking on LoseIt or MyFitnessPal. Calorie goal of 1200.  Activity Goals - begin chair exercise    Medication Recommendations- mounjaro 2.5mg   Discussion of safety/side effects     Obesity Management goals: <200  And BMI<40 for TKA    Further obesity management recommendations:   ? Annual screen for a assessment of Type II diabetes, HTN, Hyperlipidemia, Depression, OSA, NAFLD, Vitamin D deficiency and renal disease.   ? Given the association of obesity with cancer, all age appropriate cancer screenings should be UTD  ? Medication Review: If possible medication that promote weight gain should be avoided and medication that are weight-neutral or promote weight loss should be considered.     Comorbid Conditions:  ? T2DM - A1c 6.5 09/2019  ? Hypothyroidism  ? Metabolic Syndrome  ? HFpEF  ? OSA  ? OA  ? GERD    Total Time Today was 60 minutes in the following activities: Preparing to see the patient, Obtaining and/or reviewing separately obtained history, Performing a medically appropriate examination and/or evaluation, Counseling and educating the patient/family/caregiver, Ordering medications, tests, or procedures and Documenting clinical information in the electronic or other health record      Future Appointments   Date Time Provider Department Center   03/14/2021  1:45 PM Sherron Monday, MD [facility]

## 2021-01-12 ENCOUNTER — Encounter: Admit: 2021-01-12 | Discharge: 2021-01-12 | Payer: MEDICARE

## 2021-01-12 DIAGNOSIS — Z79899 Other long term (current) drug therapy: Secondary | ICD-10-CM

## 2021-01-14 ENCOUNTER — Encounter: Admit: 2021-01-14 | Discharge: 2021-01-14 | Payer: MEDICARE

## 2021-01-14 MED ORDER — MELOXICAM 15 MG PO TAB
ORAL_TABLET | Freq: Every day | 0 refills | PRN
Start: 2021-01-14 — End: ?

## 2021-01-14 MED ORDER — LEVOTHYROXINE 88 MCG PO TAB
ORAL_TABLET | Freq: Every day | 1 refills
Start: 2021-01-14 — End: ?

## 2021-01-15 ENCOUNTER — Encounter: Admit: 2021-01-15 | Discharge: 2021-01-15 | Payer: MEDICARE

## 2021-01-15 NOTE — Telephone Encounter
Attempted to contact Alicia Fox to schedule an appointment with the primary care pharmacist per their primary care provider's request. Left voicemail asking patient to contact us at [phone] to set up an appointment.    [place] Technician Coordinator   [phone]

## 2021-01-19 NOTE — Telephone Encounter
Patient called to say she would not be able to afford the mounjaro due to the deductable being $500.

## 2021-01-22 ENCOUNTER — Encounter: Admit: 2021-01-22 | Discharge: 2021-01-22 | Payer: MEDICARE

## 2021-01-23 ENCOUNTER — Encounter: Admit: 2021-01-23 | Discharge: 2021-01-23 | Payer: MEDICARE

## 2021-01-24 ENCOUNTER — Encounter: Admit: 2021-01-24 | Discharge: 2021-01-24 | Payer: MEDICARE

## 2021-01-30 ENCOUNTER — Encounter: Admit: 2021-01-30 | Discharge: 2021-01-30 | Payer: MEDICARE

## 2021-02-12 ENCOUNTER — Encounter: Admit: 2021-02-12 | Discharge: 2021-02-12 | Payer: MEDICARE

## 2021-02-19 ENCOUNTER — Encounter: Admit: 2021-02-19 | Discharge: 2021-02-19 | Payer: MEDICARE

## 2021-02-19 ENCOUNTER — Ambulatory Visit: Admit: 2021-02-19 | Discharge: 2021-02-19 | Payer: MEDICARE

## 2021-02-19 NOTE — Progress Notes
Primary goal of visit: Initial nutrition counseling visit for Intensive Behavioral Therapy for Weight Management    Nutrition Assessment of Patient:    Ht Readings from Last 1 Encounters:   01/11/21 157.5 cm (5' 2)     Wt Readings from Last 5 Encounters:   02/19/21 108.4 kg (239 lb)   01/11/21 111.4 kg (245 lb 9.6 oz)   12/20/20 109.5 kg (241 lb 8 oz)   12/11/20 111 kg (244 lb 11.4 oz)   12/07/20 111.9 kg (246 lb 12.8 oz)    *pt reported weight    BMI: Body mass index is 43.71 kg/m?Marland Kitchen  BMI Categories Adult: class III obese  Total Energy Expenditure (based on Mifflin equation x activity factor): 1555 x 1.3 = 2020 kcal  Total Calorie Prescription for weight loss: 1500 kcal  Needs to promote: weight loss and maintenance  6 months: 08/20/21    Obtained patient's verbal consent to treat them and their agreement to [facility] financial policy and NPP via this telehealth visit during the [employer] Emergency    Notes:     Met with RD for wt a few months ago but did not find it super helpful.   Recently saw SW for depression.     Has HF d/t covid - follows low na diet. She has had several family members pass over the past few years, including her husband, which caused her to gain some weight back. She really struggles during this time of the year. She is not in counseling - has declined this in the past.     Finds comfort with cooking, baking, eating.     Has lots of family members doing IF, keto, other diets, etc. She is turned off by this & has lots of questions about what she should do.     Loves eggs. Does deer/bison, salmon. She loves to cook for family but they like higher calorie higher fat foods. Has a huge craving for sugar right now - cookies, candies.     Today: asking about 1200 kcals, validity of timing of eating fruit    Topics to discuss in future:  *f/v  *recipes  *sleep/stress  *balanced meals  *HS snacking   *coffee choices    Diet:      Meal 1 Snack Meal 2 Snack Meal 3 Snack   Time          1-2 egg, 1 dry piece toast, coffee   Leftovers     Tuna packets  Son in law made her roast, potatoes, carrots Cookies     Drinks: water (drinks 5 of 15 oz bottles), coffee + SF powder cream + sugar (2 cups; calculated 135 kcal per cup on app, sometimes does decaf later in the day)  Snacks: cookies (6 shortbread) -- in evening, in front of tv, tries to keep healthy nuts around & popcorn; cherry tomatoes  Fruits/Vegetables: burnt out on salad; fruit 0-1/day, veggies 1/day  Meal Plan/Prep: uses [employer] pickup for groceries  Eating Out: 3x/month - fish, chicken, salad  Tracking: used to track, would limit Na to 700-800 mg, states Dr rec 1000 mg; Lose It    Activity:  Planned Exercise: none; R knee OA; no treadmills or walking, denies any other prescribed limitations  General Activity: ADLs, uses a cane, difficulty getting out of chairs/bed    Stress: see friends; baking, cooking, eating   Sleep: CPAP, goes to sleep with TV on, wakes 1-2x for bathroom, sometimes does not fall asleep  until 2-3 am, has tried white noise which helps; takes melatonin      Discussed benefit of tracking intake. Reviewed okay to eat fruit anytime.     Discussed benefits of achieving national physical activity recommendations of at least 150 minutes of moderate-vigorous physical activity weekly, while progressing to 225 minutes for added benefit in weight loss/maintenance.  Also discussed benefits of increasing general activity outside of planned exercise.      Nutrition Diagnosis:  Overweight/obesity  Related to: excessive energy intake, food- and nutrition-related knowledge deficit, physical inactivity, increased psychological/life stress  As evidenced by: Body mass index is 43.71 kg/m?., undesirable food choices,     Physical inactivity  Related to: injury/condition or limitation that reduces physical activity or activities of daily living  As evidenced by:  obesity (Body mass index is 43.71 kg/m?.), reports of low or infrequent physical activity, large amounts of sedentary time at home or at work, low level of NEAT (non-exercise activity thermogenesis) expended by physical activities other than formal exercise    Intervention/Plan:     1. Increase self-awareness and accountability through self-monitoring.    - follow up with RD for support   - track food/drink on Lose It app; okay to do 1500 calories, we can adjust as needed    2. Consume a minimum of 5 cups of fruits/vegetables daily.   Focus on getting at least half of foods as fruits and non-starchy vegetables. Whole grains, lean meats/eggs, some dairy, nuts and seeds made up the remainder of the diet. Foods both high in calories and low in nutritive quality are consumed rarely (baked goods, fried foods, high fat/processed meats, sweets).      3. Progress to meet recommendation for 150+ minutes of physical activity for general health, while increasing to 300+ minutes weekly for continued weight loss or increased success for weight maintenance.   - chair exercises (youtube videos) Monday, Wednesday, Friday for at least 10 minutes     4. Increase activities of daily living and reduce sedentary time.    Increase step count to active (>6,000 steps daily) category and avoid long periods (30+ minutes) of inactivity.  Step goal: * steps/day    5. Make modifications to environment to support healthy lifestyle changes.       Nutrition Monitoring and Evaluation:     Time spent with patient: 40 minutes    Follow up in 3 weeks  Contact information provided for questions     Thank you,    Claiborne Rigg, RD LD

## 2021-02-27 ENCOUNTER — Encounter: Admit: 2021-02-27 | Discharge: 2021-02-27 | Payer: MEDICARE

## 2021-03-06 ENCOUNTER — Encounter: Admit: 2021-03-06 | Discharge: 2021-03-06 | Payer: MEDICARE

## 2021-03-07 ENCOUNTER — Encounter: Admit: 2021-03-07 | Discharge: 2021-03-07 | Payer: MEDICARE

## 2021-03-09 ENCOUNTER — Encounter: Admit: 2021-03-09 | Discharge: 2021-03-09 | Payer: MEDICARE

## 2021-03-11 ENCOUNTER — Encounter: Admit: 2021-03-11 | Discharge: 2021-03-11 | Payer: MEDICARE

## 2021-03-11 MED ORDER — GABAPENTIN 100 MG PO CAP
ORAL_CAPSULE | Freq: Two times a day (BID) | 0 refills
Start: 2021-03-11 — End: ?

## 2021-03-14 ENCOUNTER — Encounter: Admit: 2021-03-14 | Discharge: 2021-03-14 | Payer: MEDICARE

## 2021-03-21 ENCOUNTER — Encounter: Admit: 2021-03-21 | Discharge: 2021-03-21 | Payer: MEDICARE

## 2021-03-26 ENCOUNTER — Encounter: Admit: 2021-03-26 | Discharge: 2021-03-26 | Payer: MEDICARE

## 2021-03-26 ENCOUNTER — Ambulatory Visit: Admit: 2021-03-26 | Discharge: 2021-03-26 | Payer: MEDICARE

## 2021-03-26 DIAGNOSIS — D3A090 Benign carcinoid tumor of the bronchus and lung: Secondary | ICD-10-CM

## 2021-03-26 LAB — POC CREATININE, RAD: CREATININE, POC: 0.5 mg/dL (ref 0.4–1.00)

## 2021-03-26 MED ORDER — IOHEXOL 350 MG IODINE/ML IV SOLN
70 mL | Freq: Once | INTRAVENOUS | 0 refills | Status: CP
Start: 2021-03-26 — End: ?
  Administered 2021-03-26: 17:00:00 70 mL via INTRAVENOUS

## 2021-03-26 MED ORDER — SODIUM CHLORIDE 0.9 % IJ SOLN
50 mL | Freq: Once | INTRAVENOUS | 0 refills | Status: CP
Start: 2021-03-26 — End: ?
  Administered 2021-03-26: 17:00:00 50 mL via INTRAVENOUS

## 2021-04-03 ENCOUNTER — Encounter: Admit: 2021-04-03 | Discharge: 2021-04-03 | Payer: MEDICARE

## 2021-04-03 NOTE — Telephone Encounter
RN prepped patient information for clinic with Cherly Hensen APRN NP on 04/16/2021 at [facility] via telehealth.    Here for 74mo f/u  Pt known to Sleep/Pulm Department by Dr. Gerilyn Nestle  Dx: OSA, Insomnia  Last SS: 2019 with AHI 105.6    LV: 08/16/2020 with Cherly Hensen  Meds: melatonin, PCP prescribes gabapentin and meloxicam    DME: [facility] [place]  RN obtained download from [place], BSN

## 2021-04-09 NOTE — Progress Notes
Date of Service: 04/10/2021       Subjective:             Alicia Fox is a 71 y.o. female.      History of Present Illness  Alicia Fox?presents to thoracic surgery clinic for follow up CT scan of?her?chest for 12 month cancer surveillance.?She presented to thoracic surgery for evaluation of?biopsy-proven left lower lobe carcinoid.??On 03/21/17 she underwent a?Robotic assisted left lower lobectomy under the direction of Dr. Particia Nearing. ?Pathology impression: ?Lung: Typical carcinoid tumor. ??Greatest dimension (centimeters): 3.0 cm.?Additional dimensions (centimeters): 2.5 x 2.5 cm.??Pathologic Stage Classification (pTNM, AJCC 8th Edition): pT1c N0?    03/26/21 CT chest   1. Previous left lower lobectomy without new or enlarging pulmonary nodule.   2. No thoracic adenopathy. ?     Today she states symptoms of chronic cough and occasionally some shortness of breath . Denies fevers, shortness of breath, pleuritic pain, bone pain, headaches, weight loss, hemoptysis. There has been no hospitalizations or changes in medical history since last visit.    No abdominal pain, no hot flashes or redness.          Review of Systems   Constitutional: Negative for chills, decreased appetite, fever and weight loss.   HENT: Negative for congestion.    Cardiovascular: Positive for dyspnea on exertion. Negative for chest pain.   Respiratory: Positive for cough and shortness of breath. Negative for hemoptysis.    Neurological: Negative for headaches.   Psychiatric/Behavioral: Negative.    Allergic/Immunologic: Negative.          Objective:         ? acetaminophen (TYLENOL) 325 mg tablet Take two tablets by mouth every 6 hours as needed. For fever or pain.   ? ascorbic acid (VITAMIN C) 500 mg tablet Take 500 mg by mouth daily.   ? atorvastatin (LIPITOR) 10 mg tablet TAKE 1 TABLET BY MOUTH EVERY DAY   ? carvediloL (COREG) 3.125 mg tablet Take one tablet by mouth twice daily with meals. Take with food.   ? cetirizine (ZYRTEC) 10 mg tablet Take 10 mg by mouth daily.   ? cholecalciferol (VITAMIN D-3) 1,000 units tablet Take 1,000 Units by mouth daily.   ? COLLAGEN MISC Use 1 tablet as directed daily.   ? fexofenadine HCl (MUCINEX ALLERGY PO) Take 1 tablet by mouth daily.   ? gabapentin (NEURONTIN) 100 mg capsule TAKE 2 CAPSULES BY MOUTH TWICE A DAY (Patient taking differently: Take 100 mg by mouth twice daily.)   ? glucosamine/msm/chondrt/C/hyal (GLUCOSAMINE-CHONDROITIN-MSM PO) Take 1 tablet by mouth daily.   ? levothyroxine (SYNTHROID) 88 mcg tablet TAKE 1 TABLET BY MOUTH EVERY DAY IN THE MORNING   ? losartan (COZAAR) 50 mg tablet TAKE 1 TABLET BY MOUTH TWICE A DAY   ? melatonin 10 mg cap Take 10 mg by mouth daily.   ? meloxicam (MOBIC) 15 mg tablet TAKE 1 TABLET BY MOUTH EVERY DAY AS NEEDED FOR PAIN   ? metFORMIN (GLUCOPHAGE) 500 mg tablet TAKE 1 TAB BY MOUTH ONCE DAILY FOR 2 WEEKS THEN INCREASE TO TWICE DAILY AFTER MEALS   ? oxybutynin XL (DITROPAN XL) 10 mg tablet Take one tablet by mouth twice daily. Do not cut/ crush/ chew   ? pantoprazole DR (PROTONIX) 40 mg tablet TAKE 1 TABLET BY MOUTH TWICE A DAY   ? polycarbophil (FIBERCON) 625 mg tablet Take 625 mg by mouth twice daily.   ? polyethylene glycol 3350 (MIRALAX) 17 g packet  Take 17 g by mouth daily as needed.   ? Psyllium Husk 0.52 gram cap Take 1 capsule by mouth twice daily.   ? QUERCETIN DIHYDRATE (BULK) MISC Take 1 tablet by mouth daily.   ? torsemide (DEMADEX) 20 mg tablet Take one tablet by mouth daily. Indications: visible water retention   ? vit C,E-Zn-coppr-lutein-zeaxan (PRESERVISION AREDS-2) 250-200-40-1 mg-unit-mg-mg cap Take 1 capsule by mouth twice daily.   ? Zinc 50 mg tab Take 1 tablet by mouth daily.     There were no vitals filed for this visit.  There is no height or weight on file to calculate BMI.     Physical Exam  Constitutional:       Appearance: Normal appearance.   HENT:      Head: Normocephalic.   Pulmonary:      Effort: Pulmonary effort is normal. No respiratory distress.   Neurological:      General: No focal deficit present.      Mental Status: She is alert.   Psychiatric:         Mood and Affect: Mood normal.         Behavior: Behavior normal.         Thought Content: Thought content normal.         Judgment: Judgment normal.              Assessment and Plan:  1. Carcinoid tumor of left lung           Ms. Schwertner presents for malignant carcinoid tumor follow up.   ?On 03/21/17 she underwent a?Robotic assisted left lower lobectomy under the direction of Dr. Particia Nearing. ?Pathology impression: ?Lung: Typical carcinoid tumor. ??Greatest dimension (centimeters): 3.0 cm.?Additional dimensions (centimeters): 2.5 x 2.5 cm.??Pathologic Stage Classification (pTNM, AJCC 8th Edition): pT1c N0?  She is doing well.  No recent hospitalization.  Ct scan from 03/2021 no evidence of recurrent carcinoid.  Plan for follow up in 12 months with a CT c/a/p w/o contrast.    NCCN guidelines for Lung neuroendocrine tumor surveillance: 12 wks-12 mo postresection CT chest with or without contrast and CT abdomen/pelvis for primary lung/thymus tumors,  H&P; >1 year postresection to 10 yrs: Every 12- 24 months CT chest, abdomen, pelvis with or without contrast for primary lung/thymus tumors, H&P.    Tad Moore, APRN-C  Thoracic Surgery  Lung Cancer Screening Program  [phone]

## 2021-04-10 ENCOUNTER — Encounter: Admit: 2021-04-10 | Discharge: 2021-04-10 | Payer: MEDICARE

## 2021-04-10 ENCOUNTER — Ambulatory Visit: Admit: 2021-04-10 | Discharge: 2021-04-11 | Payer: MEDICARE

## 2021-04-10 DIAGNOSIS — F99 Mental disorder, not otherwise specified: Secondary | ICD-10-CM

## 2021-04-10 DIAGNOSIS — C801 Malignant (primary) neoplasm, unspecified: Secondary | ICD-10-CM

## 2021-04-10 DIAGNOSIS — M1990 Unspecified osteoarthritis, unspecified site: Secondary | ICD-10-CM

## 2021-04-10 DIAGNOSIS — I10 Essential (primary) hypertension: Secondary | ICD-10-CM

## 2021-04-10 DIAGNOSIS — D3A090 Benign carcinoid tumor of the bronchus and lung: Secondary | ICD-10-CM

## 2021-04-10 DIAGNOSIS — Z86010 Personal history of colonic polyps: Secondary | ICD-10-CM

## 2021-04-10 DIAGNOSIS — E785 Hyperlipidemia, unspecified: Secondary | ICD-10-CM

## 2021-04-10 DIAGNOSIS — J189 Pneumonia, unspecified organism: Secondary | ICD-10-CM

## 2021-04-10 DIAGNOSIS — K929 Disease of digestive system, unspecified: Secondary | ICD-10-CM

## 2021-04-10 DIAGNOSIS — E079 Disorder of thyroid, unspecified: Secondary | ICD-10-CM

## 2021-04-10 DIAGNOSIS — G5603 Carpal tunnel syndrome, bilateral upper limbs: Secondary | ICD-10-CM

## 2021-04-16 ENCOUNTER — Ambulatory Visit: Admit: 2021-04-16 | Discharge: 2021-04-17 | Payer: MEDICARE

## 2021-04-16 DIAGNOSIS — R059 Cough, unspecified type: Secondary | ICD-10-CM

## 2021-04-16 DIAGNOSIS — G4733 Obstructive sleep apnea (adult) (pediatric): Secondary | ICD-10-CM

## 2021-04-16 NOTE — Patient Instructions
If you have questions or concerns, please call my nurse at [phone] or feel free to send me a mychart message

## 2021-04-16 NOTE — Progress Notes
Obtained patient's verbal consent to treat them and their agreement to [facility] financial policy and NPP via this telehealth visit during the [employer] Public Health Emergency

## 2021-04-16 NOTE — Progress Notes
Please answer thinking about these questions over the last 2 weeks.     Epworth Sleepiness Scale    How likely are you to fall asleep doing these following activities?  Please rate your answers based on the scale below:  0= Would never fall doze  1= Slight chance you would doze  2= Moderate chance you would doze  3= High chance you would doze    Sitting and reading:  0  Watching TV:  0  Sitting inactive in a public place:  0  Being a passenger in a motor vehicle for an hour or more:  0  Lying down in the afternoon:  0  Sitting and talking to someone:  0  Sitting quietly after lunch (no alcohol):  0  Stopped for a few minutes in traffic:  0  Total Score:  0

## 2021-04-16 NOTE — Assessment & Plan Note
-   Ongoing intermittent dry cough. Normal PFT's in 2018, chest CT 2023 with evidence of LLL lobectomy and tiny nodules, no emphysema noted.   - Continue Zyrtec, but could stop Mucinex  - She is on Protonix for reflux  - Encouraged her to discuss cough with PCP at upcoming appointment  - Will see her in person next visit

## 2021-04-16 NOTE — Progress Notes
Telehealth Visit Note    Date of Service: 04/16/2021    Subjective:           Alicia Fox is a 71 y.o. female.    History of Present Illness  I had the pleasure of seeing Mrs. Marhefka in clinic for OSA follow-up. She is known to Dr. Andria Meuse. 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 She is wearing a full face mask. Pressure is comfortable.   She has dry mouth some nights, humidifier seems to use- has tried to get this looked at and has tried different things  On Melatonin 10mg , relies on that    Sleeping better  Up one to use the restroom   Sometimes she has trouble falling back to sleep in the middle of the night     Wondering if she should be seen in person since she has a cough and wants to make sure everything is ok  Sees PCP regularly- has not discussed cough with her  Cough is not productive and intermittent  Chest CT recently for monitoring of history of carcinoid tumor s/p LLL lobectomy   Takes Zyrtec and mucinex daily     Epworth 0/24       Review of Systems   All other systems reviewed and are negative.      .  Objective:         ? acetaminophen (TYLENOL) 325 mg tablet Take two tablets by mouth every 6 hours as needed. For fever or pain.   ? ascorbic acid (VITAMIN C) 500 mg tablet Take 500 mg by mouth daily.   ? atorvastatin (LIPITOR) 10 mg tablet TAKE 1 TABLET BY MOUTH EVERY DAY   ? carvediloL (COREG) 3.125 mg tablet Take one tablet by mouth twice daily with meals. Take with food.   ? cetirizine (ZYRTEC) 10 mg tablet Take 10 mg by mouth daily.   ? cholecalciferol (VITAMIN D-3) 1,000 units tablet Take 1,000 Units by mouth daily.   ? COLLAGEN MISC Use 1 tablet as directed daily.   ? fexofenadine HCl (MUCINEX ALLERGY PO) Take 1 tablet by mouth daily.   ? gabapentin (NEURONTIN) 100 mg capsule TAKE 2 CAPSULES BY MOUTH TWICE A DAY (Patient taking differently: Take 100 mg by mouth twice daily.)   ? glucosamine/msm/chondrt/C/hyal (GLUCOSAMINE-CHONDROITIN-MSM PO) Take 1 tablet by mouth daily. ? levothyroxine (SYNTHROID) 88 mcg tablet TAKE 1 TABLET BY MOUTH EVERY DAY IN THE MORNING   ? losartan (COZAAR) 50 mg tablet TAKE 1 TABLET BY MOUTH TWICE A DAY   ? melatonin 10 mg cap Take 10 mg by mouth daily.   ? meloxicam (MOBIC) 15 mg tablet TAKE 1 TABLET BY MOUTH EVERY DAY AS NEEDED FOR PAIN   ? metFORMIN (GLUCOPHAGE) 500 mg tablet TAKE 1 TAB BY MOUTH ONCE DAILY FOR 2 WEEKS THEN INCREASE TO TWICE DAILY AFTER MEALS   ? oxybutynin XL (DITROPAN XL) 10 mg tablet Take one tablet by mouth twice daily. Do not cut/ crush/ chew   ? pantoprazole DR (PROTONIX) 40 mg tablet TAKE 1 TABLET BY MOUTH TWICE A DAY   ? polycarbophil (FIBERCON) 625 mg tablet Take 625 mg by mouth twice daily.   ? polyethylene glycol 3350 (MIRALAX) 17 g packet Take 17 g by mouth daily as needed.   ? Psyllium Husk 0.52 gram cap Take 1 capsule by mouth twice daily.   ? QUERCETIN DIHYDRATE (BULK) MISC Take 1 tablet by mouth daily.   ? torsemide (DEMADEX)  20 mg tablet Take one tablet by mouth daily. Indications: visible water retention   ? vit C,E-Zn-coppr-lutein-zeaxan (PRESERVISION AREDS-2) 250-200-40-1 mg-unit-mg-mg cap Take 1 capsule by mouth twice daily.   ? Zinc 50 mg tab Take 1 tablet by mouth daily.          Telehealth Patient Reported Vitals     Row Name 04/16/21 1120                BP: 137/75        Pulse: 88        Weight: 105.7 kg (233 lb)        Height: 162.6 cm (5' 4)        SpO2: 96 %        Pain Score: Zero                  Telehealth Body Mass Index: 39.99 at 04/16/2021 12:33 PM    Physical Exam  Constitutional:       Appearance: Normal appearance.   Pulmonary:      Effort: Pulmonary effort is normal. No respiratory distress.   Skin:     General: Skin is dry.   Neurological:      Mental Status: She is alert and oriented to person, place, and time.   Psychiatric:         Mood and Affect: Mood normal.         Behavior: Behavior normal.         Thought Content: Thought content normal.         Judgment: Judgment normal. Assessment and Plan:    Problem   Osa (Obstructive Sleep Apnea)    Split night Pap titration dated 10/21/2017 at the [place] sleep lab showed severe obstructive sleep apnea with AHI of 105.6 events per hour successful CPAP titration 12 cmH2O using full facemask elevated frequency of.  Limb movement with index of 30.8 events per hour and nocturnal hypoxemia with oxygen desaturation nadir of 77% oxygen saturation below 88% for about 99.1 minutes    Pt set up with CPAP @ 12cm H2O on 12/04/2017.  DME: [name]     Cough       OSA (obstructive sleep apnea)  Reviewed CPAP download. 30/30 days of usage.   Wearing 4 hours or greater 100% of nights. The average use on days used was 8 hours and 2 minutes.  Residual AHI 0.9.    The median leak was 0.2 L/min. She is tolerating and benefiting from therapy.     - Will continue current settings, CPAP 12. DME order placed to look at humidifier.    - Keep in contact with DME for routine supplies, need to contact us directly if unable to communicate with DME in a timely manner.     Cough  - Ongoing intermittent dry cough. Normal PFT's in 2018, chest CT 2023 with evidence of LLL lobectomy and tiny nodules, no emphysema noted.   - Continue Zyrtec, but could stop Mucinex  - She is on Protonix for reflux  - Encouraged her to discuss cough with PCP at upcoming appointment  - Will see her in person next visit    She has our contact information and was encouraged to call us with any questions or concerns.    RTC in 6 months in person    Orders Placed This Encounter   ? AMB REFERRAL TO HOME CARE  35 minutes spent on this patient's encounter with counseling and coordination of care taking >50% of the visit.

## 2021-04-17 ENCOUNTER — Encounter: Admit: 2021-04-17 | Discharge: 2021-04-17 | Payer: MEDICARE

## 2021-04-17 DIAGNOSIS — R6884 Jaw pain: Secondary | ICD-10-CM

## 2021-04-18 ENCOUNTER — Encounter: Admit: 2021-04-18 | Discharge: 2021-04-18 | Payer: MEDICARE

## 2021-04-18 NOTE — Telephone Encounter
Order for Humidifier check placed as discussed per office note on 04/16/21   DME: [facility]  Will continue to follow up.

## 2021-04-25 ENCOUNTER — Encounter: Admit: 2021-04-25 | Discharge: 2021-04-25 | Payer: MEDICARE

## 2021-04-25 MED ORDER — ATORVASTATIN 10 MG PO TAB
ORAL_TABLET | 3 refills | Status: AC
Start: 2021-04-25 — End: ?

## 2021-05-05 ENCOUNTER — Encounter: Admit: 2021-05-05 | Discharge: 2021-05-05 | Payer: MEDICARE

## 2021-05-05 MED ORDER — LOSARTAN 50 MG PO TAB
ORAL_TABLET | 3 refills
Start: 2021-05-05 — End: ?

## 2021-05-05 MED ORDER — MELOXICAM 15 MG PO TAB
ORAL_TABLET | 0 refills
Start: 2021-05-05 — End: ?

## 2021-05-05 MED ORDER — CARVEDILOL 3.125 MG PO TAB
3.125 mg | ORAL_TABLET | Freq: Two times a day (BID) | ORAL | 3 refills
Start: 2021-05-05 — End: ?

## 2021-05-09 ENCOUNTER — Encounter: Admit: 2021-05-09 | Discharge: 2021-05-09 | Payer: MEDICARE

## 2021-06-04 ENCOUNTER — Encounter: Admit: 2021-06-04 | Discharge: 2021-06-04 | Payer: MEDICARE

## 2021-06-07 ENCOUNTER — Encounter: Admit: 2021-06-07 | Discharge: 2021-06-07 | Payer: MEDICARE

## 2021-06-07 MED ORDER — GABAPENTIN 100 MG PO CAP
ORAL_CAPSULE | 3 refills | Status: AC
Start: 2021-06-07 — End: ?

## 2021-06-19 ENCOUNTER — Encounter: Admit: 2021-06-19 | Discharge: 2021-06-19 | Payer: MEDICARE

## 2021-06-27 ENCOUNTER — Encounter: Admit: 2021-06-27 | Discharge: 2021-06-27 | Payer: MEDICARE

## 2021-06-30 ENCOUNTER — Encounter: Admit: 2021-06-30 | Discharge: 2021-06-30 | Payer: MEDICARE

## 2021-06-30 MED ORDER — PANTOPRAZOLE 40 MG PO TBEC
ORAL_TABLET | 1 refills
Start: 2021-06-30 — End: ?

## 2021-07-11 ENCOUNTER — Encounter: Admit: 2021-07-11 | Discharge: 2021-07-11 | Payer: MEDICARE

## 2021-07-11 MED ORDER — OXYBUTYNIN CHLORIDE 10 MG PO TR24
10 mg | ORAL_TABLET | Freq: Two times a day (BID) | ORAL | 1 refills
Start: 2021-07-11 — End: ?

## 2021-07-12 ENCOUNTER — Encounter: Admit: 2021-07-12 | Discharge: 2021-07-12 | Payer: MEDICARE

## 2021-07-12 MED ORDER — OXYBUTYNIN CHLORIDE 10 MG PO TR24
10 mg | ORAL_TABLET | Freq: Two times a day (BID) | ORAL | 1 refills | 12.00000 days | Status: AC
Start: 2021-07-12 — End: ?

## 2021-07-12 MED ORDER — LEVOTHYROXINE 88 MCG PO TAB
ORAL_TABLET | 1 refills
Start: 2021-07-12 — End: ?

## 2021-07-12 MED ORDER — METFORMIN 500 MG PO TAB
ORAL_TABLET | 3 refills
Start: 2021-07-12 — End: ?

## 2021-07-12 NOTE — Telephone Encounter
Encounter created for scheduling purposes.

## 2021-07-31 ENCOUNTER — Encounter: Admit: 2021-07-31 | Discharge: 2021-07-31 | Payer: MEDICARE

## 2021-07-31 DIAGNOSIS — L989 Disorder of the skin and subcutaneous tissue, unspecified: Secondary | ICD-10-CM

## 2021-08-01 ENCOUNTER — Encounter: Admit: 2021-08-01 | Discharge: 2021-08-01 | Payer: MEDICARE

## 2021-08-02 ENCOUNTER — Encounter: Admit: 2021-08-02 | Discharge: 2021-08-02 | Payer: MEDICARE

## 2021-08-02 DIAGNOSIS — J189 Pneumonia, unspecified organism: Secondary | ICD-10-CM

## 2021-08-02 DIAGNOSIS — Z86010 Personal history of colonic polyps: Secondary | ICD-10-CM

## 2021-08-02 DIAGNOSIS — K929 Disease of digestive system, unspecified: Secondary | ICD-10-CM

## 2021-08-02 DIAGNOSIS — G5603 Carpal tunnel syndrome, bilateral upper limbs: Secondary | ICD-10-CM

## 2021-08-02 DIAGNOSIS — Z1211 Encounter for screening for malignant neoplasm of colon: Secondary | ICD-10-CM

## 2021-08-02 DIAGNOSIS — I10 Essential (primary) hypertension: Secondary | ICD-10-CM

## 2021-08-02 DIAGNOSIS — C801 Malignant (primary) neoplasm, unspecified: Secondary | ICD-10-CM

## 2021-08-02 DIAGNOSIS — Z1231 Encounter for screening mammogram for malignant neoplasm of breast: Secondary | ICD-10-CM

## 2021-08-02 DIAGNOSIS — E782 Mixed hyperlipidemia: Secondary | ICD-10-CM

## 2021-08-02 DIAGNOSIS — K635 Polyp of colon: Secondary | ICD-10-CM

## 2021-08-02 DIAGNOSIS — Z136 Encounter for screening for cardiovascular disorders: Secondary | ICD-10-CM

## 2021-08-02 DIAGNOSIS — Z9229 Personal history of other drug therapy: Secondary | ICD-10-CM

## 2021-08-02 DIAGNOSIS — F99 Mental disorder, not otherwise specified: Secondary | ICD-10-CM

## 2021-08-02 DIAGNOSIS — Z8616 History of COVID-19: Secondary | ICD-10-CM

## 2021-08-02 DIAGNOSIS — M1990 Unspecified osteoarthritis, unspecified site: Secondary | ICD-10-CM

## 2021-08-02 DIAGNOSIS — I5032 Chronic diastolic (congestive) heart failure: Secondary | ICD-10-CM

## 2021-08-02 DIAGNOSIS — E079 Disorder of thyroid, unspecified: Secondary | ICD-10-CM

## 2021-08-02 DIAGNOSIS — I3139 Pericardial effusion: Secondary | ICD-10-CM

## 2021-08-02 DIAGNOSIS — E785 Hyperlipidemia, unspecified: Secondary | ICD-10-CM

## 2021-08-02 MED ORDER — PEG-ELECTROLYTE SOLN 420 GRAM PO SOLR
0 refills | Status: AC
Start: 2021-08-02 — End: ?

## 2021-08-02 MED ORDER — PEG-ELECTROLYTE SOLN 420 GRAM PO SOLR
0 refills | Status: DC
Start: 2021-08-02 — End: 2021-08-02

## 2021-08-02 NOTE — Progress Notes
Date of Service: 08/02/2021    Alicia Fox is a 71 y.o. female.       HPI      Alicia Fox is a 71 y.o. 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    Patient spent the past few months in [place], [place] taking care of her sister which was recovering after surgery.  She was very busy and was not be able to keep up with her diet.  Patient reports that few months ago her weight decreased to 220 pounds.  Currently she weighs 233 pounds.    She is overall doing well without any chest pain and no heart palpitations.  Today in our office the blood pressure and heart rate were under reasonable control.    In October 2022 patient was evaluated with the following: Congestive heart failure.    ? Regadenoson MPI-no ischemia, LVEF 62%  ? 2D echo Doppler study- global hypokinesis, LVEF 45%, no significant valvular abnormalities.  Patient does not have any symptoms compatible with         Vitals:    08/02/21 1324   BP: 132/88   BP Source: Arm, Left Upper   Pulse: 78   SpO2: 99%   O2 Device: None (Room air)   PainSc: Zero   Weight: 105.8 kg (233 lb 3.2 oz)   Height: 162 cm (5' 3.78)     Body mass index is 40.31 kg/m?Marland Kitchen     Past Medical History  Patient Active Problem List    Diagnosis Date Noted   ? History of COVID-19 05/13/2019     Priority: Medium     She developed COVID in 12/20 with week long hospitalization and need for oxygen/steroids but not sent home on oxygen.  She apparently had pericardial effusion also as complication but no recurrence since single pericardiocentesis in March 2021.      ? Stress incontinence of urine 12/20/2020   ? 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 Other insomnia 08/16/2020   ? Elevated hemoglobin A1c 06/01/2020   ? 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 Hooded upper eyelid 08/04/2019   ? COVID-19 vaccine administered 07/15/2019   ? Other headache syndrome 05/28/2019   ? COVID-19 vaccine administered 05/28/2019   ? Pericardial effusion 05/11/2019   ? Shortness of breath with exposure to severe acute respiratory syndrome coronavirus 2 (SARS-CoV-2) 04/05/2019   ? Morbid obesity (HCC) 02/18/2019   ? Chest wall pain 02/18/2019   ? Pneumonia due to COVID-19 virus 02/15/2019   ? COVID-19 02/05/2019   ? OSA (obstructive sleep apnea) 02/17/2018     Split night Pap titration dated 10/21/2017 at the [place] sleep lab showed severe obstructive sleep apnea with AHI of 105.6 events per hour successful CPAP titration 12 cmH2O using full facemask elevated frequency of.  Limb movement with index of 30.8 events per hour and nocturnal hypoxemia with oxygen desaturation nadir of 77% oxygen saturation below 88% for about 99.1 minutes    Pt set up with CPAP @ 12cm H2O on 12/04/2017.  DME: [name]     ? 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 Particia Nearing. ?Pathology impression: ?Lung: Typical carcinoid tumor. ??Greatest dimension (centimeters): 3.0 cm.?Additional dimensions (centimeters): 2.5 x 2.5 cm.??Pathologic Stage Classification (pTNM, AJCC 8th Edition): pT1c N0?     ? Carpal tunnel syndrome of right wrist 10/22/2016   ? Carpal tunnel syndrome of left wrist 10/09/2016     Added automatically from request for surgery [id number]     ? 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 Hypercalcemia 08/09/2016 ? Carpal tunnel syndrome, bilateral 08/06/2016   ? Abnormal CT of the head 08/06/2016   ? Cough 08/09/2014   ? Framingham cardiac risk <10% in next 10 years 08/09/2014   ? Medication side effects 05/18/2014   ? Bilateral edema of lower extremity 04/28/2014   ? Diastolic heart failure (HCC) 04/28/2014   ? Hyperlipemia 01/18/2010   ? Hypertension 07/21/2008   ? 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 Obesity 07/21/2008         Review of Systems   Constitutional: Negative.   HENT: Negative.    Eyes: Negative.    Cardiovascular: Positive for leg swelling.   Respiratory: Negative.    Endocrine: Negative.    Hematologic/Lymphatic: Negative.    Skin: Negative.    Musculoskeletal: Negative.    Gastrointestinal: Negative.    Genitourinary: Negative.    Neurological: Negative.    Psychiatric/Behavioral: Negative.    Allergic/Immunologic: Negative.        Physical Exam  General Appearance: normal in appearance  Skin: warm, moist, no ulcers or xanthomas  Eyes: conjunctivae and lids normal, pupils are equal and round  Lips & Oral Mucosa: no pallor or cyanosis  Neck Veins: neck veins are flat, neck veins are not distended  Chest Inspection: chest is normal in appearance  Respiratory Effort: breathing comfortably, no respiratory distress  Auscultation/Percussion: lungs clear to auscultation, no rales or rhonchi, no wheezing  Cardiac Rhythm: regular rhythm and normal rate  Cardiac Auscultation: S1, S2 normal, no rub, no gallop  Murmurs: no murmur  Carotid Arteries: normal carotid upstroke bilaterally, no bruit  Lower Extremity Edema: no lower extremity edema  Abdominal Exam: soft, non-tender, no masses, bowel sounds normal  Liver & Spleen: no organomegaly  Language and Memory: patient responsive and seems to comprehend information  Neurologic Exam: neurological assessment grossly intact      Cardiovascular Studies      Cardiovascular Health Factors  Vitals BP Readings from Last 3 Encounters:   08/02/21 132/88   01/11/21 (!) 163/72 12/20/20 130/88     Wt Readings from Last 3 Encounters:   08/02/21 105.8 kg (233 lb 3.2 oz)   02/19/21 108.4 kg (239 lb)   01/11/21 111.4 kg (245 lb 9.6 oz)     BMI Readings from Last 3 Encounters:   08/02/21 40.31 kg/m?   02/19/21 43.71 kg/m?   01/11/21 44.92 kg/m?      Smoking Social History     Tobacco Use   Smoking Status Former   ? Types: Cigarettes   ? Quit date: 03/04/1989   ? Years since quitting: 32.4   Smokeless Tobacco Never   Tobacco Comments    quit in 1991      Lipid Profile Cholesterol   Date Value Ref Range Status   09/02/2019 161 <200 MG/DL Final     HDL  Date Value Ref Range Status   09/02/2019 48 >40 MG/DL Final     LDL   Date Value Ref Range Status   09/02/2019 92 <100 mg/dL Final     Triglycerides   Date Value Ref Range Status   09/02/2019 96 <150 MG/DL Final      Blood Sugar Hemoglobin A1C   Date Value Ref Range Status   05/25/2020 6.3 (H) 4.0 - 6.0 % Final     Comment:     The ADA recommends that most patients with type 1 and type 2 diabetes maintain   an A1c level <7%.       Glucose   Date Value Ref Range Status   11/08/2020 100 70 - 100 MG/DL Final   16/12/9602 540 (H) 70 - 100 MG/DL Final   98/01/9146 829 (H) 70 - 100 MG/DL Final     Glucose, POC   Date Value Ref Range Status   01/03/2017 138 (H) 70 - 100 MG/DL Final          Problems Addressed Today  Encounter Diagnoses   Name Primary?   ? Primary hypertension Yes   ? Mixed hyperlipidemia    ? Chronic diastolic heart failure (HCC)    ? Pericardial effusion    ? Hypercalcemia    ? History of COVID-19    ? Fen-phen history    ? Screening for heart disease        Assessment and Plan     Assessment:    1.  HFpEF- no significant symptoms of shortness of breath  2.  Chronic bilateral lower extremity edema due to #1 and also due to noncompliance with a strict low-sodium diet  3.  Primary hypertension  4.  History of COVID-19 pneumonia-recovered  5.  History of lung cancer-status post surgical intervention, incomplete remission  6. Obesity-current BMI 40.31 kg/m? but losing weight  7.  History of pericardial effusion, status post pericardiocentesis  8.  Family history of premature CAD    Plan:    1.  Continue current medications  2.  Follow was strict low-sodium diet  3.  Continue risk factors modifications  4.  Follow-up office visit in 1 year    Total Time Today was 40 minutes in the following activities: Preparing to see the patient, Obtaining and/or reviewing separately obtained history, Performing a medically appropriate examination and/or evaluation, Counseling and educating the patient/family/caregiver, Ordering medications, tests, or procedures, Referring and communication with other health care professionals (when not separately reported), Documenting clinical information in the electronic or other health record, Independently interpreting results (not separately reported) and communicating results to the patient/family/caregiver and Care coordination (not separately reported)         Current Medications (including today's revisions)  ? acetaminophen (TYLENOL) 325 mg tablet Take two tablets by mouth every 6 hours as needed. For fever or pain.   ? ascorbic acid (VITAMIN C) 500 mg tablet Take one tablet by mouth daily.   ? atorvastatin (LIPITOR) 10 mg tablet TAKE 1 TABLET BY MOUTH EVERY DAY   ? carvediloL (COREG) 3.125 mg tablet TAKE ONE TABLET BY MOUTH TWICE DAILY WITH MEALS. TAKE WITH FOOD.   ? cetirizine (ZYRTEC) 10 mg tablet Take one tablet by mouth daily.   ? cholecalciferol (VITAMIN D-3) 1,000 units tablet Take one tablet by mouth daily.   ? COLLAGEN MISC Use 1 tablet as directed daily.   ? fexofenadine HCl (MUCINEX ALLERGY PO) Take 1 tablet by mouth daily.   ?  gabapentin (NEURONTIN) 100 mg capsule TAKE 2 CAPSULES BY MOUTH TWICE A DAY   ? glucosamine/msm/chondrt/C/hyal (GLUCOSAMINE-CHONDROITIN-MSM PO) Take 1 tablet by mouth daily.   ? levothyroxine (SYNTHROID) 88 mcg tablet TAKE 1 TABLET BY MOUTH EVERY DAY IN THE MORNING   ? losartan (COZAAR) 50 mg tablet TAKE 1 TABLET BY MOUTH TWICE A DAY   ? melatonin 10 mg cap Take one capsule by mouth daily.   ? meloxicam (MOBIC) 15 mg tablet TAKE 1 TABLET BY MOUTH EVERY DAY AS NEEDED FOR PAIN   ? metFORMIN (GLUCOPHAGE) 500 mg tablet TAKE 1 TAB BY MOUTH ONCE DAILY FOR 2 WEEKS THEN INCREASE TO TWICE DAILY AFTER MEALS   ? oxybutynin XL (DITROPAN XL) 10 mg tablet Take one tablet by mouth twice daily. Do not cut/ crush/ chew   ? pantoprazole DR (PROTONIX) 40 mg tablet TAKE 1 TABLET BY MOUTH TWICE A DAY   ? peg-electrolyte solution (NULYTELY LEMON-LIME) 420 gram oral solution Split dose by mouth as directed by GI office   ? polycarbophil (FIBERCON) 625 mg tablet Take one tablet by mouth twice daily.   ? polyethylene glycol 3350 (MIRALAX) 17 g packet Take one packet by mouth daily as needed.   ? Psyllium Husk 0.52 gram cap Take one capsule by mouth twice daily.   ? QUERCETIN DIHYDRATE (BULK) MISC Take 1 tablet by mouth daily.   ? torsemide (DEMADEX) 20 mg tablet Take one tablet by mouth daily. Indications: visible water retention   ? vit C,E-Zn-coppr-lutein-zeaxan (PRESERVISION AREDS-2) 250-200-40-1 mg-unit-mg-mg cap Take 1 capsule by mouth twice daily.   ? Zinc 50 mg tab Take one tablet by mouth daily.

## 2021-08-02 NOTE — Progress Notes
Colonoscopy ordered along with 2 day prep per Dr. Clois Comber. Sent instructions to patient.

## 2021-08-10 ENCOUNTER — Ambulatory Visit: Admit: 2021-08-10 | Discharge: 2021-08-10 | Payer: MEDICARE

## 2021-08-10 DIAGNOSIS — K635 Polyp of colon: Secondary | ICD-10-CM

## 2021-08-10 DIAGNOSIS — Z1211 Encounter for screening for malignant neoplasm of colon: Secondary | ICD-10-CM

## 2021-08-16 ENCOUNTER — Ambulatory Visit: Admit: 2021-08-16 | Discharge: 2021-08-16 | Payer: MEDICARE

## 2021-08-17 ENCOUNTER — Encounter: Admit: 2021-08-17 | Discharge: 2021-08-17 | Payer: MEDICARE

## 2021-08-17 DIAGNOSIS — E785 Hyperlipidemia, unspecified: Secondary | ICD-10-CM

## 2021-08-17 DIAGNOSIS — G5603 Carpal tunnel syndrome, bilateral upper limbs: Secondary | ICD-10-CM

## 2021-08-17 DIAGNOSIS — F99 Mental disorder, not otherwise specified: Secondary | ICD-10-CM

## 2021-08-17 DIAGNOSIS — J189 Pneumonia, unspecified organism: Secondary | ICD-10-CM

## 2021-08-17 DIAGNOSIS — M1990 Unspecified osteoarthritis, unspecified site: Secondary | ICD-10-CM

## 2021-08-17 DIAGNOSIS — E079 Disorder of thyroid, unspecified: Secondary | ICD-10-CM

## 2021-08-17 DIAGNOSIS — K929 Disease of digestive system, unspecified: Secondary | ICD-10-CM

## 2021-08-17 DIAGNOSIS — Z86010 Personal history of colonic polyps: Secondary | ICD-10-CM

## 2021-08-17 DIAGNOSIS — I10 Essential (primary) hypertension: Secondary | ICD-10-CM

## 2021-08-17 DIAGNOSIS — C801 Malignant (primary) neoplasm, unspecified: Secondary | ICD-10-CM

## 2021-08-27 ENCOUNTER — Ambulatory Visit: Admit: 2021-08-27 | Discharge: 2021-08-27 | Payer: MEDICARE

## 2021-08-27 ENCOUNTER — Encounter: Admit: 2021-08-27 | Discharge: 2021-08-27 | Payer: MEDICARE

## 2021-08-27 DIAGNOSIS — Z1231 Encounter for screening mammogram for malignant neoplasm of breast: Secondary | ICD-10-CM

## 2021-08-31 ENCOUNTER — Encounter: Admit: 2021-08-31 | Discharge: 2021-08-31 | Payer: MEDICARE

## 2021-08-31 NOTE — Telephone Encounter
Patient states she has had 3 episodes of red urine since taking a gummy weight loss supplement (supplement contains red food dye - patient has sent pictures of supplement and ingredients list in MyChart). Urine was red when symptoms started yesterday, now has lightened to a pinkish color. Patient has no other symptoms - denies fever, pain with urination, frequency, chills, flank pain, and urgency. Upon calling the manufacturer of the gummies, patient found that manufacturer seemed very untrustworthy. Patient agreeable to providing urine sample, if indicated.    This RN advised patient to drink water and continue to monitor her symptoms. Advised patient that if she develops a fever or experiences pain, to go to the ED immediately. Patient verbalized understanding. Patient will reach back out on Monday if symptoms have not completely resolved.    Avelina Laine, RN        Sherrica Ann Schley Marge  P [facility] Triage Nurse (supporting Hiram Gash, MD) 10 minutes ago (3:07 PM)       Bought into this on a whim. I took for first time yesterday and my urine turned a color of light red. Possibly a natural reaction to one of the components.  I contacted the distributor for info of side effects and no one could explain it. Just if I want to send back for refund, a doctor had to say this was not medically approved for me and these gummies may be detrimental for my health. I don't know if they are or not. Yes, I fell to the hype all over the internet with Keto gummies. And if you feel they maybe of some worth, I will continue.  Thanks for your time and assistance.  Hope you and all in your office, have a safe holiday weekend  -Marge      Reason for Disposition  ? All other urine symptoms    Answer Assessment - Initial Assessment Questions  1. SYMPTOM: What's the main symptom you're concerned about? (e.g., frequency, incontinence)      Red urine after taking questionable supplement    2. ONSET: When did the red urine  start?      Yesterday after taking 3 tablets of supplement    3. PAIN: Is there any pain? If Yes, ask: How bad is it? (Scale: 1-10; mild, moderate, severe)      No pain whatsoever    4. CAUSE: What do you think is causing the symptoms?      Supplement    5. OTHER SYMPTOMS: Do you have any other symptoms? (e.g., fever, flank pain, blood in urine, pain with urination)      No other symptoms whatsoever    6. PREGNANCY: Is there any chance you are pregnant? When was your last menstrual period?      n/a    Protocols used: URINARY SYMPTOMS-A-[place]

## 2021-08-31 NOTE — Telephone Encounter
See triage call for additional information.     , RN

## 2021-09-17 ENCOUNTER — Encounter: Admit: 2021-09-17 | Discharge: 2021-09-17 | Payer: MEDICARE

## 2021-09-17 NOTE — Telephone Encounter
An encounter has been created for documentation only (often for preparation of an upcoming appointment or for follow up on orders/imaging) and patient does not need contact RN and did not miss a phone call or appointment.     RN prepped patient information for clinic with Cherly Hensen APRN NP on 10/18/21 at 1030 via in person.     Here for 6 mo follow up  Dx:OSA  Pt known to sleep department by Dr. Gerilyn Nestle    Split night PAP titration 10/2017 with AHI 105.6  Previous cpap 12    Normal PFT's in 2018, chest CT 2023 with evidence of LLL lobectomy and tiny nodules, no emphysema noted.     LV: 04/16/21 with Cherly Hensen   Meds:Melatonin, Zyrtec and daily     DHW:[id number] - Annette St[place], [place]  RN obtained download from AV

## 2021-09-25 ENCOUNTER — Encounter: Admit: 2021-09-25 | Discharge: 2021-09-25 | Payer: MEDICARE

## 2021-09-25 DIAGNOSIS — J189 Pneumonia, unspecified organism: Secondary | ICD-10-CM

## 2021-09-25 DIAGNOSIS — I10 Essential (primary) hypertension: Secondary | ICD-10-CM

## 2021-09-25 DIAGNOSIS — K929 Disease of digestive system, unspecified: Secondary | ICD-10-CM

## 2021-09-25 DIAGNOSIS — G4733 Obstructive sleep apnea (adult) (pediatric): Secondary | ICD-10-CM

## 2021-09-25 DIAGNOSIS — Z86010 Personal history of colonic polyps: Secondary | ICD-10-CM

## 2021-09-25 DIAGNOSIS — E039 Hypothyroidism, unspecified: Secondary | ICD-10-CM

## 2021-09-25 DIAGNOSIS — M25569 Pain in unspecified knee: Secondary | ICD-10-CM

## 2021-09-25 DIAGNOSIS — G5603 Carpal tunnel syndrome, bilateral upper limbs: Secondary | ICD-10-CM

## 2021-09-25 DIAGNOSIS — E079 Disorder of thyroid, unspecified: Secondary | ICD-10-CM

## 2021-09-25 DIAGNOSIS — C801 Malignant (primary) neoplasm, unspecified: Secondary | ICD-10-CM

## 2021-09-25 DIAGNOSIS — F99 Mental disorder, not otherwise specified: Secondary | ICD-10-CM

## 2021-09-25 DIAGNOSIS — E119 Type 2 diabetes mellitus without complications: Secondary | ICD-10-CM

## 2021-09-25 DIAGNOSIS — M545 Low back pain: Secondary | ICD-10-CM

## 2021-09-25 DIAGNOSIS — E785 Hyperlipidemia, unspecified: Secondary | ICD-10-CM

## 2021-09-25 DIAGNOSIS — M1990 Unspecified osteoarthritis, unspecified site: Secondary | ICD-10-CM

## 2021-10-02 ENCOUNTER — Encounter: Admit: 2021-10-02 | Discharge: 2021-10-02 | Payer: MEDICARE

## 2021-10-02 ENCOUNTER — Ambulatory Visit: Admit: 2021-10-02 | Discharge: 2021-10-02 | Payer: MEDICARE

## 2021-10-02 DIAGNOSIS — E119 Type 2 diabetes mellitus without complications: Secondary | ICD-10-CM

## 2021-10-02 DIAGNOSIS — M25569 Pain in unspecified knee: Secondary | ICD-10-CM

## 2021-10-02 DIAGNOSIS — G5603 Carpal tunnel syndrome, bilateral upper limbs: Secondary | ICD-10-CM

## 2021-10-02 DIAGNOSIS — G4733 Obstructive sleep apnea (adult) (pediatric): Secondary | ICD-10-CM

## 2021-10-02 DIAGNOSIS — E039 Hypothyroidism, unspecified: Secondary | ICD-10-CM

## 2021-10-02 DIAGNOSIS — F99 Mental disorder, not otherwise specified: Secondary | ICD-10-CM

## 2021-10-02 DIAGNOSIS — J189 Pneumonia, unspecified organism: Secondary | ICD-10-CM

## 2021-10-02 DIAGNOSIS — C801 Malignant (primary) neoplasm, unspecified: Secondary | ICD-10-CM

## 2021-10-02 DIAGNOSIS — Z86010 Personal history of colonic polyps: Secondary | ICD-10-CM

## 2021-10-02 DIAGNOSIS — I10 Essential (primary) hypertension: Secondary | ICD-10-CM

## 2021-10-02 DIAGNOSIS — M545 Low back pain: Secondary | ICD-10-CM

## 2021-10-02 DIAGNOSIS — M1990 Unspecified osteoarthritis, unspecified site: Secondary | ICD-10-CM

## 2021-10-02 DIAGNOSIS — E079 Disorder of thyroid, unspecified: Secondary | ICD-10-CM

## 2021-10-02 DIAGNOSIS — K929 Disease of digestive system, unspecified: Secondary | ICD-10-CM

## 2021-10-02 DIAGNOSIS — E785 Hyperlipidemia, unspecified: Secondary | ICD-10-CM

## 2021-10-02 MED ORDER — PROPOFOL 10 MG/ML IV EMUL 50 ML (INFUSION)(AM)(OR)
INTRAVENOUS | 0 refills | Status: DC
Start: 2021-10-02 — End: 2021-10-02

## 2021-10-03 ENCOUNTER — Encounter: Admit: 2021-10-03 | Discharge: 2021-10-03 | Payer: MEDICARE

## 2021-10-03 DIAGNOSIS — E079 Disorder of thyroid, unspecified: Secondary | ICD-10-CM

## 2021-10-03 DIAGNOSIS — G5603 Carpal tunnel syndrome, bilateral upper limbs: Secondary | ICD-10-CM

## 2021-10-03 DIAGNOSIS — M25569 Pain in unspecified knee: Secondary | ICD-10-CM

## 2021-10-03 DIAGNOSIS — F99 Mental disorder, not otherwise specified: Secondary | ICD-10-CM

## 2021-10-03 DIAGNOSIS — I10 Essential (primary) hypertension: Secondary | ICD-10-CM

## 2021-10-03 DIAGNOSIS — Z86010 Personal history of colonic polyps: Secondary | ICD-10-CM

## 2021-10-03 DIAGNOSIS — E785 Hyperlipidemia, unspecified: Secondary | ICD-10-CM

## 2021-10-03 DIAGNOSIS — E119 Type 2 diabetes mellitus without complications: Secondary | ICD-10-CM

## 2021-10-03 DIAGNOSIS — E039 Hypothyroidism, unspecified: Secondary | ICD-10-CM

## 2021-10-03 DIAGNOSIS — M1990 Unspecified osteoarthritis, unspecified site: Secondary | ICD-10-CM

## 2021-10-03 DIAGNOSIS — C801 Malignant (primary) neoplasm, unspecified: Secondary | ICD-10-CM

## 2021-10-03 DIAGNOSIS — J189 Pneumonia, unspecified organism: Secondary | ICD-10-CM

## 2021-10-03 DIAGNOSIS — K929 Disease of digestive system, unspecified: Secondary | ICD-10-CM

## 2021-10-03 DIAGNOSIS — G4733 Obstructive sleep apnea (adult) (pediatric): Secondary | ICD-10-CM

## 2021-10-03 DIAGNOSIS — M545 Low back pain: Secondary | ICD-10-CM

## 2021-10-06 ENCOUNTER — Encounter: Admit: 2021-10-06 | Discharge: 2021-10-06 | Payer: MEDICARE

## 2021-10-06 MED ORDER — OXYBUTYNIN CHLORIDE 10 MG PO TR24
10 mg | ORAL_TABLET | Freq: Two times a day (BID) | ORAL | 0 refills
Start: 2021-10-06 — End: ?

## 2021-10-15 NOTE — Progress Notes
Date of Service: 10/18/2021    Subjective:             Alicia Fox is a 71 y.o. female.    History of Present Illness  I had the pleasure of seeing Alicia Fox in clinic for OSA follow-up. She is known to Dr. Andria Meuse. She has severe OSA (AHI 105) and is on CPAP 12.?She is wearing a full face mask. Pressure is comfortable.   CPAP has been going well. Does not think she could sleep without it  Lower teeth are going inward, working with orthodontist  Tried a nasal pillow mask but did not work for her   Getting supplies ok from DME   Denies shortness of breath and chest pain at night     Taking melatonin 10mg  nightly, no side effects  TV on at bedtime helps     Has URI symptoms in early August. Fever and fatigue, cough, COVID negative     Still has daily cough, takes mucinex and zyrtec. Cough is dry   Voice is more hoarse   Shortness of breath with significant exertion   Using can for balance     Quit smoking >30 years     Epworth 3/24  Does mention she will fall asleep in the afternoon while watching tv, will sleep for 15-20 minutes.   Does a lot of driving, does not get sleepy        Review of Systems   Constitutional: Positive for appetite change.   HENT: Positive for dental problem.    Eyes: Positive for photophobia.   Respiratory: Positive for choking.    Cardiovascular: Positive for leg swelling.   Musculoskeletal: Positive for back pain and joint swelling.   All other systems reviewed and are negative.        Objective:         ? acetaminophen (TYLENOL) 325 mg tablet Take two tablets by mouth every 6 hours as needed. For fever or pain.   ? ascorbic acid (VITAMIN C) 500 mg tablet Take one tablet by mouth daily.   ? atorvastatin (LIPITOR) 10 mg tablet TAKE 1 TABLET BY MOUTH EVERY DAY   ? carvediloL (COREG) 3.125 mg tablet TAKE ONE TABLET BY MOUTH TWICE DAILY WITH MEALS. TAKE WITH FOOD.   ? cetirizine (ZYRTEC) 10 mg tablet Take one tablet by mouth daily.   ? cholecalciferol (VITAMIN D-3) 1,000 units tablet Take one tablet by mouth daily.   ? COLLAGEN MISC Use 1 tablet as directed daily.   ? fexofenadine HCl (MUCINEX ALLERGY PO) Take 1 tablet by mouth daily.   ? gabapentin (NEURONTIN) 100 mg capsule TAKE 2 CAPSULES BY MOUTH TWICE A DAY   ? glucosamine/msm/chondrt/C/hyal (GLUCOSAMINE-CHONDROITIN-MSM PO) Take 1 tablet by mouth daily.   ? levothyroxine (SYNTHROID) 88 mcg tablet TAKE 1 TABLET BY MOUTH EVERY DAY IN THE MORNING   ? losartan (COZAAR) 50 mg tablet TAKE 1 TABLET BY MOUTH TWICE A DAY   ? melatonin 10 mg cap Take one capsule by mouth daily.   ? meloxicam (MOBIC) 15 mg tablet TAKE 1 TABLET BY MOUTH EVERY DAY AS NEEDED FOR PAIN   ? metFORMIN (GLUCOPHAGE) 500 mg tablet TAKE 1 TAB BY MOUTH ONCE DAILY FOR 2 WEEKS THEN INCREASE TO TWICE DAILY AFTER MEALS   ? oxybutynin XL (DITROPAN XL) 10 mg tablet TAKE ONE TABLET BY MOUTH TWICE DAILY. DO NOT CUT/ CRUSH/ CHEW   ? pantoprazole DR (PROTONIX) 40 mg tablet TAKE 1 TABLET BY  MOUTH TWICE A DAY   ? polycarbophil (FIBERCON) 625 mg tablet Take one tablet by mouth twice daily.   ? polyethylene glycol 3350 (MIRALAX) 17 g packet Take one packet by mouth daily as needed.   ? Psyllium Husk 0.52 gram cap Take one capsule by mouth twice daily.   ? QUERCETIN DIHYDRATE (BULK) MISC Take 1 tablet by mouth daily.   ? torsemide (DEMADEX) 20 mg tablet Take one tablet by mouth daily. Indications: visible water retention   ? vit C,E-Zn-coppr-lutein-zeaxan (PRESERVISION AREDS-2) 250-200-40-1 mg-unit-mg-mg cap Take 1 capsule by mouth twice daily.   ? Zinc 50 mg tab Take one tablet by mouth daily.     Vitals:    10/18/21 1027   BP: 133/66   Pulse: 70   Temp: 36.7 ?C (98.1 ?F)   SpO2: 95%   TempSrc: Skin   Weight: 110.7 kg (244 lb)   Height: 157.5 cm (5' 2)  Comment: per pt     Body mass index is 44.63 kg/m?Marland Kitchen     Physical Exam  Vitals reviewed.   Constitutional:       Appearance: Normal appearance.   Cardiovascular:      Rate and Rhythm: Normal rate and regular rhythm.   Pulmonary: Effort: Pulmonary effort is normal. No respiratory distress.      Breath sounds: Normal breath sounds.   Skin:     General: Skin is warm and dry.   Neurological:      Mental Status: She is alert and oriented to person, place, and time.   Psychiatric:         Mood and Affect: Mood normal.         Behavior: Behavior normal.         Thought Content: Thought content normal.         Judgment: Judgment normal.                       Assessment and Plan:    Problem   Osa (Obstructive Sleep Apnea)    Split night Pap titration dated 10/21/2017 at the [place] sleep lab showed severe obstructive sleep apnea with AHI of 105.6 events per hour successful CPAP titration 12 cmH2O using full facemask elevated frequency of.  Limb movement with index of 30.8 events per hour and nocturnal hypoxemia with oxygen desaturation nadir of 77% oxygen saturation below 88% for about 99.1 minutes    Pt set up with CPAP @ 12cm H2O on 12/04/2017.  DME: [name]     Cough        OSA (obstructive sleep apnea)  Reviewed CPAP download. 30/30 days of usage.   Wearing 4 hours or greater 100% of nights. The average use on days used was 8 hours and 2 minutes.  Residual AHI 1.5.    The median leak was 0.0 L/min. She is tolerating and benefiting from therapy.     - Will continue current settings, CPAP 12.     - Keep in contact with DME for routine supplies, need to contact us directly if unable to communicate with DME in a timely manner.     Cough  PFT's ordered today with ongoing cough symptoms and exertional dyspnea.   Continue allergy regimen   Lungs clear on exam, normal wob, speaking in full sentences, cough not observed today    She has our contact information and was encouraged to call us with any questions or concerns.    RTC annually  Orders Placed This Encounter   ? COMPLETE PULMONARY FUNCTIONS     Future Appointments   Date Time Provider Department Center   10/25/2021  4:00 PM Sherron Monday, MD [facility]   10/26/2021  1:00 PM PF LAB SCHEDULE B [id number] None   12/26/2021  1:30 PM Hiram Gash, MD [email or web address]   02/19/2022 11:00 AM Dorris Fetch, MD [facility] Exam     Total time- 33 minutes

## 2021-10-18 ENCOUNTER — Encounter: Admit: 2021-10-18 | Discharge: 2021-10-18 | Payer: MEDICARE

## 2021-10-18 ENCOUNTER — Ambulatory Visit: Admit: 2021-10-18 | Discharge: 2021-10-19 | Payer: MEDICARE

## 2021-10-18 VITALS — BP 133/66 | HR 70 | Temp 98.10000°F | Ht 62.0 in | Wt 244.0 lb

## 2021-10-18 DIAGNOSIS — M25569 Pain in unspecified knee: Secondary | ICD-10-CM

## 2021-10-18 DIAGNOSIS — M545 Low back pain: Secondary | ICD-10-CM

## 2021-10-18 DIAGNOSIS — I10 Essential (primary) hypertension: Secondary | ICD-10-CM

## 2021-10-18 DIAGNOSIS — M1990 Unspecified osteoarthritis, unspecified site: Secondary | ICD-10-CM

## 2021-10-18 DIAGNOSIS — E785 Hyperlipidemia, unspecified: Secondary | ICD-10-CM

## 2021-10-18 DIAGNOSIS — G5603 Carpal tunnel syndrome, bilateral upper limbs: Secondary | ICD-10-CM

## 2021-10-18 DIAGNOSIS — J189 Pneumonia, unspecified organism: Secondary | ICD-10-CM

## 2021-10-18 DIAGNOSIS — G4733 Obstructive sleep apnea (adult) (pediatric): Secondary | ICD-10-CM

## 2021-10-18 DIAGNOSIS — C801 Malignant (primary) neoplasm, unspecified: Secondary | ICD-10-CM

## 2021-10-18 DIAGNOSIS — F99 Mental disorder, not otherwise specified: Secondary | ICD-10-CM

## 2021-10-18 DIAGNOSIS — R059 Cough, unspecified type: Secondary | ICD-10-CM

## 2021-10-18 DIAGNOSIS — E119 Type 2 diabetes mellitus without complications: Secondary | ICD-10-CM

## 2021-10-18 DIAGNOSIS — Z86010 Personal history of colonic polyps: Secondary | ICD-10-CM

## 2021-10-18 DIAGNOSIS — E039 Hypothyroidism, unspecified: Secondary | ICD-10-CM

## 2021-10-18 DIAGNOSIS — K929 Disease of digestive system, unspecified: Secondary | ICD-10-CM

## 2021-10-18 DIAGNOSIS — E079 Disorder of thyroid, unspecified: Secondary | ICD-10-CM

## 2021-10-18 NOTE — Assessment & Plan Note
PFT's ordered today with ongoing cough symptoms and exertional dyspnea.   Continue allergy regimen   Lungs clear on exam, normal wob, speaking in full sentences, cough not observed today

## 2021-10-18 NOTE — Patient Instructions
If you have questions or concerns, please call my nurse at [phone] or feel free to send me a mychart message

## 2021-10-18 NOTE — Assessment & Plan Note
Reviewed CPAP download. 30/30 days of usage.   Wearing 4 hours or greater 100% of nights. The average use on days used was 8 hours and 2 minutes.  Residual AHI 1.5.    The median leak was 0.0 L/min. She is tolerating and benefiting from therapy.     - Will continue current settings, CPAP 12.     - Keep in contact with DME for routine supplies, need to contact us directly if unable to communicate with DME in a timely manner.

## 2021-10-19 DIAGNOSIS — G4733 Obstructive sleep apnea (adult) (pediatric): Principal | ICD-10-CM

## 2021-10-25 ENCOUNTER — Encounter: Admit: 2021-10-25 | Discharge: 2021-10-25 | Payer: MEDICARE

## 2021-10-25 ENCOUNTER — Ambulatory Visit: Admit: 2021-10-25 | Discharge: 2021-10-26 | Payer: MEDICARE

## 2021-10-25 DIAGNOSIS — R350 Frequency of micturition: Secondary | ICD-10-CM

## 2021-10-25 DIAGNOSIS — N3941 Urge incontinence: Secondary | ICD-10-CM

## 2021-10-25 DIAGNOSIS — E119 Type 2 diabetes mellitus without complications: Secondary | ICD-10-CM

## 2021-10-25 DIAGNOSIS — I10 Essential (primary) hypertension: Secondary | ICD-10-CM

## 2021-10-25 DIAGNOSIS — C801 Malignant (primary) neoplasm, unspecified: Secondary | ICD-10-CM

## 2021-10-25 DIAGNOSIS — E039 Hypothyroidism, unspecified: Secondary | ICD-10-CM

## 2021-10-25 DIAGNOSIS — M1990 Unspecified osteoarthritis, unspecified site: Secondary | ICD-10-CM

## 2021-10-25 DIAGNOSIS — R3915 Urgency of urination: Secondary | ICD-10-CM

## 2021-10-25 DIAGNOSIS — G5603 Carpal tunnel syndrome, bilateral upper limbs: Secondary | ICD-10-CM

## 2021-10-25 DIAGNOSIS — E785 Hyperlipidemia, unspecified: Secondary | ICD-10-CM

## 2021-10-25 DIAGNOSIS — F99 Mental disorder, not otherwise specified: Secondary | ICD-10-CM

## 2021-10-25 DIAGNOSIS — Z86010 Personal history of colonic polyps: Secondary | ICD-10-CM

## 2021-10-25 DIAGNOSIS — J189 Pneumonia, unspecified organism: Secondary | ICD-10-CM

## 2021-10-25 DIAGNOSIS — M25569 Pain in unspecified knee: Secondary | ICD-10-CM

## 2021-10-25 DIAGNOSIS — M545 Low back pain: Secondary | ICD-10-CM

## 2021-10-25 DIAGNOSIS — E079 Disorder of thyroid, unspecified: Secondary | ICD-10-CM

## 2021-10-25 DIAGNOSIS — G4733 Obstructive sleep apnea (adult) (pediatric): Secondary | ICD-10-CM

## 2021-10-25 DIAGNOSIS — K929 Disease of digestive system, unspecified: Secondary | ICD-10-CM

## 2021-10-25 MED ORDER — OXYBUTYNIN CHLORIDE 10 MG PO TR24
10 mg | ORAL_TABLET | Freq: Two times a day (BID) | ORAL | 3 refills | 12.00000 days | Status: AC
Start: 2021-10-25 — End: ?

## 2021-10-25 NOTE — Progress Notes
Obtained patient's verbal consent to treat them and their agreement to [facility] financial policy and NPP via this telehealth visit during the [facility] Emergency      Date of Service: 10/25/2021    Subjective:             Synthia Fairbank is a 71 y.o. female.    History of Present Illness  Ms. Lippold presents to clinic via telemedicine for followup of her urinary symptoms including urgency, frequency and urge incontinence  She was seen once previously on 12/07/2020  At that time, she was having urination and leaking using 4-5 pads each day, and nocturia at least 2x per night  She had been on Oxybutynin and we had recommended increasing her dosing to BID to see if that would help with her symptoms  We also recommend trying pelvic floor physical therapy with biofeedback to see if that would help with urge suppression and reduction of leaking    Today, the patient reports that she is stable overall  Her daytime and nighttime symptoms are both improved on her current medications    She is taking the Oxybutynin twice daily (10 mg XL BID) and this is helping significantly with symptoms  Having some dry mouth, but otherwise feels good about improvement  She is having less nocturia and only getting up typically once per night - which we reviewed is considered normal  Was nocturia about every 1-2 hours, and now it is about 6-7 hours between voids    She did work with pelvic floor physical therapy in [place] but felt it was a long distance for her and she had minimal improvement  We reviewed that continuing to do urge suppression can be helpful      Medical History:   Diagnosis Date   ? Arthritis     lumbar, knees   ? 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 Carpal tunnel syndrome on both sides 2018    L > R   ? Disorder of thyroid gland     hypothyroid   ? DM (diabetes mellitus) (HCC)    ? Gastrointestinal disorder     gerd at times-under control with meds   ? HTN (hypertension)    ? Hx of adenomatous colonic polyps    ? Hyperlipidemia    ? Hypothyroid    ? Knee pain     right   ? Low back pain    ? OSA on CPAP    ? Pneumonia     bilateral bacterial   ? Psychiatric illness     depression     Surgical History:   Procedure Laterality Date   ? HX MASTECTOMY Bilateral 1986    1986-stomberg mastectomy   ? BREAST BIOPSY  07/1984   ? MASTECTOMY, PARTIAL Bilateral 12/1984   ? MASTECTOMY, MODIFIED RADICAL Bilateral 1987    1987-full modified   ? BREAST SURGERY Bilateral 07/1985    implants   ? HX DILATION AND CURETTAGE  07/1994   ? HX HYSTERECTOMY  10/1994    complete   ? HX ABDOMINOPLASTY  10/1994   ? HIP SURGERY  02/1995   ? HX LIPOMA RESECTION  11/1999    outer upper left arm   ? ANUS SURGERY  01/2010    anal fissure   ? HX LIPOMA RESECTION  02/2010    inside upper right arm   ? LEFT DECOMPRESSION NERVE MEDIAN WITH CARPAL TUNNEL RELEASE Left  10/17/2016    Performed by Delice Lesch, MD at [facility] OR   ? BRONCHOSCOPY N/A 01/13/2017    Performed by Audie Box, MD at [facility] OR   ? BRONCHOSCOPY WITH TRANSBRONCHIAL BIOPSY N/A 01/13/2017    Performed by Audie Box, MD at [facility] OR   ? BRONCHOSCOPY WITH ENDOBRONCHIAL ULTRASOUND GUIDED TRANSTRACHEAL/ TRANSBRONCHIAL SAMPLING - 3 OR MORE MEDIASTINAL/ HILAR LYMPH NODE STATIONS/ STRUCTURE - RIGID N/A 01/13/2017    Performed by Audie Box, MD at [facility] OR   ? BRONCHOSCOPY WITH IMAGE - GUIDED NAVIGATION - FLEXIBLE N/A 01/13/2017    Performed by Audie Box, MD at [facility] OR   ? ROBOTIC LEFT LOWER LOBECTOMY Left 03/21/2017    Performed by Particia Nearing, MD at [facility] CVOR   ? Colonoscopy N/A 10/01/2019    Performed by Meyer Cory, MD at [facility] ENDO   ? ESOPHAGOGASTRODUODENOSCOPY WITH SPECIMEN COLLECTION BY BRUSHING/ WASHING N/A 10/01/2019    Performed by Meyer Cory, MD at [facility] ENDO   ? ESOPHAGOGASTRODUODENOSCOPY WITH SNARE REMOVAL TUMOR/ POLYP/ OTHER LESION - FLEXIBLE N/A 10/01/2019    Performed by Meyer Cory, MD at [facility] ENDO   ? ESOPHAGOGASTRODUODENOSCOPY WITH BIOPSY - FLEXIBLE N/A 10/01/2019    Performed by Meyer Cory, MD at [facility] ENDO   ? COLONOSCOPY WITH SNARE REMOVAL TUMOR/ POLYP/ OTHER LESION  10/01/2019    Performed by Meyer Cory, MD at [facility] ENDO   ? Colonoscopy N/A 10/02/2021    Performed by Meyer Cory, MD at [facility] OR   ? SPINE SURGERY  1993 and 1996    cervical fusion     Social History     Socioeconomic History   ? Marital status: Widowed   Occupational History   ? Occupation: retired   Tobacco Use   ? Smoking status: Former     Types: Cigarettes     Quit date: 03/04/1989     Years since quitting: 32.6   ? Smokeless tobacco: Never   ? Tobacco comments:     quit in 1991   Substance and Sexual Activity   ? Alcohol use: Not Currently   ? Drug use: No     Family History   Problem Relation Age of Onset   ? Complete Hysterectomy Mother 78        uterine prolaps   ? Hypertension Mother    ? Atrial Fibrillation Mother    ? Hypertension Father    ? COPD Father    ? Heart Failure Father    ? Kidney Cancer Brother 7   ? Cancer-Prostate Brother 60   ? Intellectual Disability Brother    ? Heart Attack Paternal Uncle    ? Cancer-Hematologic Maternal Grandfather         leukemia   ? Cancer-Breast Paternal Grandmother 31   ? Cancer-Breast Maternal Aunt 84     Allergies   Allergen Reactions   ? Codeine HEADACHE and ITCHING                  Review of Systems      Objective:         ? acetaminophen (TYLENOL) 325 mg tablet Take two tablets by mouth every 6 hours as needed. For fever or pain.   ? ascorbic acid (VITAMIN C) 500 mg tablet Take one tablet by mouth daily.   ? atorvastatin (LIPITOR) 10 mg tablet TAKE 1 TABLET BY MOUTH EVERY DAY   ? carvediloL (COREG) 3.125 mg tablet TAKE ONE TABLET BY MOUTH  TWICE DAILY WITH MEALS. TAKE WITH FOOD.   ? cetirizine (ZYRTEC) 10 mg tablet Take one tablet by mouth daily.   ? cholecalciferol (VITAMIN D-3) 1,000 units tablet Take one tablet by mouth daily.   ? COLLAGEN MISC Use 1 tablet as directed daily.   ? fexofenadine HCl (MUCINEX ALLERGY PO) Take 1 tablet by mouth daily.   ? gabapentin (NEURONTIN) 100 mg capsule TAKE 2 CAPSULES BY MOUTH TWICE A DAY (Patient taking differently: one capsule twice daily.)   ? glucosamine/msm/chondrt/C/hyal (GLUCOSAMINE-CHONDROITIN-MSM PO) Take 1 tablet by mouth daily.   ? levothyroxine (SYNTHROID) 88 mcg tablet TAKE 1 TABLET BY MOUTH EVERY DAY IN THE MORNING   ? losartan (COZAAR) 50 mg tablet TAKE 1 TABLET BY MOUTH TWICE A DAY   ? melatonin 10 mg cap Take one capsule by mouth daily.   ? meloxicam (MOBIC) 15 mg tablet TAKE 1 TABLET BY MOUTH EVERY DAY AS NEEDED FOR PAIN (Patient taking differently: 1/2 pill per day)   ? metFORMIN (GLUCOPHAGE) 500 mg tablet TAKE 1 TAB BY MOUTH ONCE DAILY FOR 2 WEEKS THEN INCREASE TO TWICE DAILY AFTER MEALS   ? oxybutynin XL (DITROPAN XL) 10 mg tablet Take one tablet by mouth twice daily. Do not cut/ crush/ chew   ? pantoprazole DR (PROTONIX) 40 mg tablet TAKE 1 TABLET BY MOUTH TWICE A DAY   ? polycarbophil (FIBERCON) 625 mg tablet Take one tablet by mouth twice daily.   ? polyethylene glycol 3350 (MIRALAX) 17 g packet Take one packet by mouth daily as needed.   ? Psyllium Husk 0.52 gram cap Take one capsule by mouth twice daily.   ? QUERCETIN DIHYDRATE (BULK) MISC Take 1 tablet by mouth daily.   ? torsemide (DEMADEX) 20 mg tablet Take one tablet by mouth daily. Indications: visible water retention   ? vit C,E-Zn-coppr-lutein-zeaxan (PRESERVISION AREDS-2) 250-200-40-1 mg-unit-mg-mg cap Take 1 capsule by mouth twice daily.   ? Zinc 50 mg tab Take one tablet by mouth daily.     Vitals:    10/25/21 1507   PainSc: Zero     There is no height or weight on file to calculate BMI.     Physical Exam  Video not working on her camera       Assessment and Plan:  Patient with urinary urgency, frequency and urge incontinence symptoms - doing well on current reigmen  She indicates that her oxybutynin BID is working well, but is expensive for her  We discussed how cost is influenced by contracting between her insurance and drug companies  She will be given a list of all OAB medication options to review to see if there is something with lower cost - can change if she would like to try something else - advised to let our office know    We reviewed behavioral treatments in detail including options such as pelvic floor exercise, urge suppression methods, timed voiding, diet modification, hydration, and prevention and treatment of constipation.  Provided written instruction materials.    RTC in 1 year via telemedicine or sooner for problems      History and examination reviewed and key elements confirmed.  Discussed with the patient in detail and questions answered.    I personally saw and evaluated the patient and determined the plan of care.    Galit Urich L. Littie Deeds, MD, MPH    Total visit time > 30 minutes including review of records, history, exam, counseling, coordination of care, and documentation.  Coding also based  on visit complexity.

## 2021-11-02 ENCOUNTER — Encounter: Admit: 2021-11-02 | Discharge: 2021-11-02 | Payer: MEDICARE

## 2021-11-02 ENCOUNTER — Ambulatory Visit: Admit: 2021-11-02 | Discharge: 2021-11-02 | Payer: MEDICARE

## 2021-11-02 DIAGNOSIS — G4733 Obstructive sleep apnea (adult) (pediatric): Secondary | ICD-10-CM

## 2021-11-19 ENCOUNTER — Encounter: Admit: 2021-11-19 | Discharge: 2021-11-19 | Payer: MEDICARE

## 2021-11-19 MED ORDER — METFORMIN 500 MG PO TAB
ORAL_TABLET | 3 refills | Status: AC
Start: 2021-11-19 — End: ?

## 2021-11-19 NOTE — Telephone Encounter
Refill request received for Metformin  Last OV 12/20/2020- yearly physical scheduled for 12/26/2021  Last fill 5.12.2023  Last labs 9.7.2022    Routing to Dr. Joaquim Lai for approval/refusal

## 2021-12-11 ENCOUNTER — Encounter: Admit: 2021-12-11 | Discharge: 2021-12-11 | Payer: MEDICARE

## 2021-12-11 MED ORDER — PANTOPRAZOLE 40 MG PO TBEC
ORAL_TABLET | ORAL | 1 refills | 90.00000 days | Status: AC
Start: 2021-12-11 — End: ?

## 2021-12-11 MED ORDER — LEVOTHYROXINE 88 MCG PO TAB
ORAL_TABLET | ORAL | 1 refills | 30.00000 days | Status: AC
Start: 2021-12-11 — End: ?

## 2021-12-11 NOTE — Telephone Encounter
Refill request received for Levothyroxine and pantoprazole  Last OV 12/20/2020  Last fill 07/02/20  Last labs 11/08/21    Routing to Dr. Joaquim Lai for approval/refusal

## 2021-12-20 ENCOUNTER — Encounter: Admit: 2021-12-20 | Discharge: 2021-12-20 | Payer: MEDICARE

## 2021-12-20 DIAGNOSIS — R32 Unspecified urinary incontinence: Secondary | ICD-10-CM

## 2021-12-20 DIAGNOSIS — Z0000 Encounter for general adult medical examination without abnormal findings: Secondary | ICD-10-CM

## 2021-12-20 DIAGNOSIS — E782 Mixed hyperlipidemia: Secondary | ICD-10-CM

## 2021-12-20 DIAGNOSIS — R739 Hyperglycemia, unspecified: Secondary | ICD-10-CM

## 2021-12-20 DIAGNOSIS — Z9189 Other specified personal risk factors, not elsewhere classified: Secondary | ICD-10-CM

## 2021-12-20 DIAGNOSIS — R7309 Other abnormal glucose: Secondary | ICD-10-CM

## 2021-12-26 ENCOUNTER — Ambulatory Visit: Admit: 2021-12-26 | Discharge: 2021-12-26 | Payer: MEDICARE

## 2021-12-26 ENCOUNTER — Encounter: Admit: 2021-12-26 | Discharge: 2021-12-26 | Payer: MEDICARE

## 2021-12-26 DIAGNOSIS — G5603 Carpal tunnel syndrome, bilateral upper limbs: Secondary | ICD-10-CM

## 2021-12-26 DIAGNOSIS — M1990 Unspecified osteoarthritis, unspecified site: Secondary | ICD-10-CM

## 2021-12-26 DIAGNOSIS — G4733 Obstructive sleep apnea (adult) (pediatric): Secondary | ICD-10-CM

## 2021-12-26 DIAGNOSIS — J189 Pneumonia, unspecified organism: Secondary | ICD-10-CM

## 2021-12-26 DIAGNOSIS — E785 Hyperlipidemia, unspecified: Secondary | ICD-10-CM

## 2021-12-26 DIAGNOSIS — E782 Mixed hyperlipidemia: Secondary | ICD-10-CM

## 2021-12-26 DIAGNOSIS — E119 Type 2 diabetes mellitus without complications: Secondary | ICD-10-CM

## 2021-12-26 DIAGNOSIS — K929 Disease of digestive system, unspecified: Secondary | ICD-10-CM

## 2021-12-26 DIAGNOSIS — E079 Disorder of thyroid, unspecified: Secondary | ICD-10-CM

## 2021-12-26 DIAGNOSIS — E039 Hypothyroidism, unspecified: Secondary | ICD-10-CM

## 2021-12-26 DIAGNOSIS — M25569 Pain in unspecified knee: Secondary | ICD-10-CM

## 2021-12-26 DIAGNOSIS — Z0000 Encounter for general adult medical examination without abnormal findings: Secondary | ICD-10-CM

## 2021-12-26 DIAGNOSIS — R739 Hyperglycemia, unspecified: Secondary | ICD-10-CM

## 2021-12-26 DIAGNOSIS — C801 Malignant (primary) neoplasm, unspecified: Secondary | ICD-10-CM

## 2021-12-26 DIAGNOSIS — C7A090 Malignant carcinoid tumor of the bronchus and lung: Secondary | ICD-10-CM

## 2021-12-26 DIAGNOSIS — F99 Mental disorder, not otherwise specified: Secondary | ICD-10-CM

## 2021-12-26 DIAGNOSIS — Z780 Asymptomatic menopausal state: Secondary | ICD-10-CM

## 2021-12-26 DIAGNOSIS — M545 Low back pain: Secondary | ICD-10-CM

## 2021-12-26 DIAGNOSIS — I10 Essential (primary) hypertension: Secondary | ICD-10-CM

## 2021-12-26 DIAGNOSIS — R32 Unspecified urinary incontinence: Secondary | ICD-10-CM

## 2021-12-26 DIAGNOSIS — Z9189 Other specified personal risk factors, not elsewhere classified: Secondary | ICD-10-CM

## 2021-12-26 DIAGNOSIS — R7309 Other abnormal glucose: Secondary | ICD-10-CM

## 2021-12-26 DIAGNOSIS — Z86010 Personal history of colonic polyps: Secondary | ICD-10-CM

## 2021-12-26 DIAGNOSIS — F331 Major depressive disorder, recurrent, moderate: Secondary | ICD-10-CM

## 2021-12-26 LAB — COMPREHENSIVE METABOLIC PANEL
CHLORIDE: 106 MMOL/L (ref 98–110)
POTASSIUM: 4.2 MMOL/L (ref 3.5–5.1)
SODIUM: 142 MMOL/L (ref 137–147)

## 2021-12-26 LAB — CBC AND DIFF
ABSOLUTE BASO COUNT: 0 K/UL (ref 0–0.20)
ABSOLUTE EOS COUNT: 0.2 K/UL (ref 0–0.45)
ABSOLUTE LYMPH COUNT: 1.4 K/UL (ref 1.0–4.8)
ABSOLUTE MONO COUNT: 0.4 K/UL (ref 0–0.80)
ABSOLUTE NEUTROPHIL: 4 K/UL (ref 1.8–7.0)
BASOPHILS %: 1 % (ref 0–2)
EOSINOPHILS %: 4 % (ref 0–5)
HEMATOCRIT: 35 % — ABNORMAL LOW (ref 36–45)
HEMOGLOBIN: 11 g/dL — ABNORMAL LOW (ref 12.0–15.0)
MCH: 22 pg — ABNORMAL LOW (ref 26–34)
MONOCYTES %: 8 % (ref >60)
RBC COUNT: 5 M/UL — ABNORMAL HIGH (ref 4.0–5.0)
WBC COUNT: 6.2 K/UL — ABNORMAL HIGH (ref 4.5–11.0)

## 2021-12-26 LAB — URINALYSIS DIPSTICK
GLUCOSE,UA: NEGATIVE
NITRITE: POSITIVE — AB
PROTEIN,UA: NEGATIVE
URINE ASCORBIC ACID, UA: NEGATIVE
URINE BILE: NEGATIVE
URINE BLOOD: NEGATIVE
URINE KETONE: NEGATIVE
URINE PH: 8 (ref 5.0–8.0)
URINE SPEC GRAVITY: 1 (ref 1.005–1.030)

## 2021-12-26 LAB — LIPID PROFILE
CHOLESTEROL: 161 mg/dL — ABNORMAL LOW (ref <200)
HDL: 71 mg/dL (ref >40)
LDL: 87 mg/dL (ref <100)
NON HDL CHOLESTEROL: 90 mg/dL — ABNORMAL LOW (ref 24–44)
TRIGLYCERIDES: 79 mg/dL — ABNORMAL HIGH (ref <150)
VLDL: 16 mg/dL (ref 41–77)

## 2021-12-26 LAB — URINALYSIS, MICROSCOPIC

## 2021-12-26 LAB — TSH WITH FREE T4 REFLEX: TSH: 1 uU/mL — ABNORMAL LOW (ref 0.35–5.00)

## 2021-12-26 NOTE — Patient Instructions
Health Maintenance   Topic Date Due    DTAP/TDAP VACCINES (2 - Td or Tdap) 12/26/2020    ADVANCE CARE PLANNING DISCUSSION AND DOCUMENTATION  03/04/2021    INFLUENZA VACCINE (1) 10/02/2021    COVID-19 VACCINE (7 - 2023-24 season) 11/02/2021    BREAST CANCER SCREENING  08/28/2022    PHYSICAL (COMPREHENSIVE) EXAM  12/27/2022    MEDICARE ANNUAL WELLNESS VISIT  12/27/2022    COLORECTAL CANCER SCREENING  10/02/2024    OSTEOPOROSIS SCREENING/MONITORING  Completed    SHINGLES RECOMBINANT VACCINE  Completed    PNEUMOCOCCAL VACCINE 65+ YRS  Completed    HEPATITIS C SCREENING  Completed

## 2021-12-28 ENCOUNTER — Encounter: Admit: 2021-12-28 | Discharge: 2021-12-28 | Payer: MEDICARE

## 2021-12-28 MED ORDER — SULFAMETHOXAZOLE-TRIMETHOPRIM 800-160 MG PO TAB
1 | ORAL_TABLET | Freq: Two times a day (BID) | ORAL | 0 refills | Status: AC
Start: 2021-12-28 — End: ?

## 2022-01-03 ENCOUNTER — Encounter: Admit: 2022-01-03 | Discharge: 2022-01-03 | Payer: MEDICARE

## 2022-01-03 DIAGNOSIS — R300 Dysuria: Secondary | ICD-10-CM

## 2022-01-03 MED ORDER — SULFAMETHOXAZOLE-TRIMETHOPRIM 800-160 MG PO TAB
1 | ORAL_TABLET | Freq: Two times a day (BID) | ORAL | 0 refills | Status: AC
Start: 2022-01-03 — End: ?

## 2022-01-03 NOTE — Telephone Encounter
Requested dm eye exam from [place] office fx# [phone]

## 2022-01-07 ENCOUNTER — Encounter: Admit: 2022-01-07 | Discharge: 2022-01-07 | Payer: MEDICARE

## 2022-01-07 MED ORDER — MELOXICAM 15 MG PO TAB
ORAL_TABLET | ORAL | 1 refills | 30.00000 days | Status: AC
Start: 2022-01-07 — End: ?

## 2022-01-11 ENCOUNTER — Encounter: Admit: 2022-01-11 | Discharge: 2022-01-11 | Payer: MEDICARE

## 2022-02-11 ENCOUNTER — Encounter: Admit: 2022-02-11 | Discharge: 2022-02-11 | Payer: MEDICARE

## 2022-02-11 ENCOUNTER — Ambulatory Visit: Admit: 2022-02-11 | Discharge: 2022-02-11 | Payer: MEDICARE

## 2022-02-11 DIAGNOSIS — R300 Dysuria: Secondary | ICD-10-CM

## 2022-02-11 DIAGNOSIS — Z780 Asymptomatic menopausal state: Secondary | ICD-10-CM

## 2022-02-11 LAB — URINALYSIS DIPSTICK
GLUCOSE,UA: NEGATIVE
NITRITE: NEGATIVE
PROTEIN,UA: NEGATIVE
URINE ASCORBIC ACID, UA: POSITIVE — AB
URINE BILE: NEGATIVE
URINE BLOOD: NEGATIVE
URINE KETONE: NEGATIVE
URINE PH: 7 (ref 5.0–8.0)
URINE SPEC GRAVITY: 1 (ref 1.005–1.030)

## 2022-02-11 LAB — URINALYSIS, MICROSCOPIC

## 2022-02-13 ENCOUNTER — Encounter: Admit: 2022-02-13 | Discharge: 2022-02-13 | Payer: MEDICARE

## 2022-02-13 MED ORDER — CIPROFLOXACIN HCL 250 MG PO TAB
250 mg | ORAL_TABLET | Freq: Two times a day (BID) | ORAL | 0 refills | 10.00000 days | Status: AC
Start: 2022-02-13 — End: ?

## 2022-02-19 ENCOUNTER — Encounter: Admit: 2022-02-19 | Discharge: 2022-02-19 | Payer: MEDICARE

## 2022-02-19 DIAGNOSIS — F99 Mental disorder, not otherwise specified: Secondary | ICD-10-CM

## 2022-02-19 DIAGNOSIS — Z8616 History of COVID-19: Secondary | ICD-10-CM

## 2022-02-19 DIAGNOSIS — I429 Cardiomyopathy, unspecified: Secondary | ICD-10-CM

## 2022-02-19 DIAGNOSIS — J189 Pneumonia, unspecified organism: Secondary | ICD-10-CM

## 2022-02-19 DIAGNOSIS — D539 Nutritional anemia, unspecified: Secondary | ICD-10-CM

## 2022-02-19 DIAGNOSIS — I10 Essential (primary) hypertension: Secondary | ICD-10-CM

## 2022-02-19 DIAGNOSIS — Z86010 Personal history of colonic polyps: Secondary | ICD-10-CM

## 2022-02-19 DIAGNOSIS — K929 Disease of digestive system, unspecified: Secondary | ICD-10-CM

## 2022-02-19 DIAGNOSIS — G4733 Obstructive sleep apnea (adult) (pediatric): Secondary | ICD-10-CM

## 2022-02-19 DIAGNOSIS — G5603 Carpal tunnel syndrome, bilateral upper limbs: Secondary | ICD-10-CM

## 2022-02-19 DIAGNOSIS — E782 Mixed hyperlipidemia: Secondary | ICD-10-CM

## 2022-02-19 DIAGNOSIS — M1990 Unspecified osteoarthritis, unspecified site: Secondary | ICD-10-CM

## 2022-02-19 DIAGNOSIS — E785 Hyperlipidemia, unspecified: Secondary | ICD-10-CM

## 2022-02-19 DIAGNOSIS — E039 Hypothyroidism, unspecified: Secondary | ICD-10-CM

## 2022-02-19 DIAGNOSIS — C801 Malignant (primary) neoplasm, unspecified: Secondary | ICD-10-CM

## 2022-02-19 DIAGNOSIS — I3139 Pericardial effusion: Secondary | ICD-10-CM

## 2022-02-19 DIAGNOSIS — Z9229 Personal history of other drug therapy: Secondary | ICD-10-CM

## 2022-02-19 DIAGNOSIS — I5032 Chronic diastolic (congestive) heart failure: Secondary | ICD-10-CM

## 2022-02-19 DIAGNOSIS — E119 Type 2 diabetes mellitus without complications: Secondary | ICD-10-CM

## 2022-02-19 DIAGNOSIS — E7800 Pure hypercholesterolemia, unspecified: Secondary | ICD-10-CM

## 2022-02-19 DIAGNOSIS — M545 Low back pain: Secondary | ICD-10-CM

## 2022-02-19 DIAGNOSIS — K219 Gastro-esophageal reflux disease without esophagitis: Secondary | ICD-10-CM

## 2022-02-19 DIAGNOSIS — E669 Obesity, unspecified: Secondary | ICD-10-CM

## 2022-02-19 DIAGNOSIS — E079 Disorder of thyroid, unspecified: Secondary | ICD-10-CM

## 2022-02-19 DIAGNOSIS — M25569 Pain in unspecified knee: Secondary | ICD-10-CM

## 2022-02-19 DIAGNOSIS — R600 Localized edema: Secondary | ICD-10-CM

## 2022-02-19 MED ORDER — POTASSIUM CHLORIDE 10 MEQ PO CPER
10 meq | ORAL_CAPSULE | Freq: Two times a day (BID) | ORAL | 3 refills | 30.00000 days | Status: AC
Start: 2022-02-19 — End: ?

## 2022-02-19 MED ORDER — TORSEMIDE 20 MG PO TAB
20 mg | ORAL_TABLET | Freq: Every day | ORAL | 3 refills | 67.50000 days | Status: AC
Start: 2022-02-19 — End: ?

## 2022-02-19 NOTE — Progress Notes
Date of Service: 02/19/2022    Alicia Fox is a 71 y.o. female.       HPI       Alicia Fox is a 71 y.o. 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    Patient reports increased lower extremity edema, this is obvious in the lower, tibial third bilaterally.  She states being compliant with a strict low-sodium diet.    Patient is on torsemide, however she takes this probably 4 days a week, it does promote diuresis and she does not like to take this medication if she has to go outside the house.    She has not had chest pain and no heart palpitations.    In October 2022 patient was evaluated with the following:  2D echo Doppler study-LVEF = 45% with global hypokinesis, mild LA dilatation, no significant valvular abnormalities  Adenosine MPI-no evidence of ischemia, LVEF 62%.         Vitals:    02/19/22 1055   BP: (!) 158/84   BP Source: Arm, Left Upper   Pulse: 81   SpO2: 99%   O2 Device: None (Room air)   PainSc: Zero   Weight: 116.3 kg (256 lb 6.4 oz)   Height: 162 cm (5' 3.78)     Body mass index is 44.32 kg/m?Marland Kitchen     Past Medical History  Patient Active Problem List    Diagnosis Date Noted    History of COVID-19 05/13/2019     Priority: Medium     She developed COVID in 12/20 with week long hospitalization and need for oxygen/steroids but not sent home on oxygen.  She apparently had pericardial effusion also as complication but no recurrence since single pericardiocentesis in March 2021.       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    Type 2 diabetes mellitus without complication, without long-term current use of insulin (HCC) 12/26/2021    Stress incontinence of urine 12/20/2020    Multilevel degenerative disc disease 11/21/2020    Other insomnia 08/16/2020    Elevated hemoglobin A1c 06/01/2020    Bilateral primary osteoarthritis of knee 08/10/2019    Hooded upper eyelid 08/04/2019    COVID-19 vaccine administered 07/15/2019    Other headache syndrome 05/28/2019    COVID-19 vaccine administered 05/28/2019    Shortness of breath with exposure to severe acute respiratory syndrome coronavirus 2 (SARS-CoV-2) 04/05/2019    Morbid obesity (HCC) 02/18/2019    Chest wall pain 02/18/2019    Pneumonia due to COVID-19 virus 02/15/2019    COVID-19 02/05/2019    OSA (obstructive sleep apnea) 02/17/2018     Split night Pap titration dated 10/21/2017 at the [facility] showed severe obstructive sleep apnea with AHI of 105.6 events per hour successful CPAP titration 12 cmH2O using full facemask elevated frequency of.  Limb movement with index of 30.8 events per hour and nocturnal hypoxemia with oxygen  desaturation nadir of 77% oxygen saturation below 88% for about 99.1 minutes    Pt set up with CPAP @ 12cm H2O on 12/04/2017.  DME: [name]      Carcinoid tumor of left lung 12/17/2016     On 03/21/17 she underwent a Robotic assisted left lower lobectomy under the direction of Dr. Particia Nearing.  Pathology impression:  Lung: Typical carcinoid tumor.   Greatest dimension (centimeters): 3.0 cm. Additional dimensions (centimeters): 2.5 x 2.5 cm.  Pathologic Stage Classification (pTNM, AJCC 8th Edition): pT1c N0       Carpal tunnel syndrome of right wrist 10/22/2016    Carpal tunnel syndrome of left wrist 10/09/2016     Added automatically from request for surgery [id number]      Spondylosis of cervical region without myelopathy or radiculopathy 09/24/2016    Hypercalcemia 08/09/2016    Carpal tunnel syndrome, bilateral 08/06/2016    Abnormal CT of the head 08/06/2016    Cough 08/09/2014    [place] cardiac risk <10% in next 10 years 08/09/2014    Bilateral edema of lower extremity 04/28/2014    Diastolic heart failure (HCC) 04/28/2014    Hyperlipemia 01/18/2010    Hypertension 07/21/2008    Fen-phen history 07/21/2008         Review of Systems   Constitutional: Positive for malaise/fatigue and weight gain.   HENT: Negative.     Eyes: Negative.    Cardiovascular:  Positive for dyspnea on exertion and leg swelling.   Respiratory: Negative.     Endocrine: Positive for polyuria.   Hematologic/Lymphatic: Negative.    Skin: Negative.    Musculoskeletal:  Positive for back pain, muscle weakness and myalgias.   Gastrointestinal: Negative.    Genitourinary:  Positive for dysuria, flank pain, frequency, hesitancy, incomplete emptying, pelvic pain and urgency.   Neurological:  Positive for excessive daytime sleepiness, headaches, numbness, paresthesias and weakness.   Psychiatric/Behavioral: Negative.     Allergic/Immunologic: Negative.        Physical Exam  General Appearance: obese, elevated BMI = 44.32 kg/m?  Skin: warm, moist, no ulcers or xanthomas  Eyes: conjunctivae and lids normal, pupils are equal and round  Lips & Oral Mucosa: no pallor or cyanosis  Neck Veins: neck veins are flat, neck veins are not distended  Chest Inspection: chest is normal in appearance  Respiratory Effort: breathing comfortably, no respiratory distress  Auscultation/Percussion: lungs clear to auscultation, no rales or rhonchi, no wheezing  Cardiac Rhythm: regular rhythm and normal rate  Cardiac Auscultation: S1, S2 normal, no rub, no gallop  Murmurs: no murmur  Carotid Arteries: normal carotid upstroke bilaterally, no bruit  Abdominal aorta: could not be examined due to obese adomen  Lower Extremity Edema: no lower extremity edema  Abdominal Exam: soft, non-tender, no masses, bowel sounds normal  Liver & Spleen: no organomegaly  Language and Memory: patient responsive and seems to comprehend information  Neurologic Exam: neurological assessment grossly intact        Cardiovascular Studies      Cardiovascular Health Factors  Vitals BP Readings from Last 3 Encounters:   02/19/22 (!) 158/84   12/26/21 132/84   10/18/21 133/66     Wt Readings from Last 3 Encounters:   02/19/22 116.3 kg (256 lb 6.4 oz)   12/26/21 113.4 kg (250 lb)   10/18/21 110.7 kg (244 lb)     BMI Readings from Last 3 Encounters:   02/19/22 44.32 kg/m?   12/26/21 45.42 kg/m?  10/18/21 44.63 kg/m?      Smoking Social History     Tobacco Use   Smoking Status Former    Packs/day: 0.50    Years: 15.00    Additional pack years: 0.00    Total pack years: 7.50    Types: Cigarettes    Quit date: 03/04/1989    Years since quitting: 32.9   Smokeless Tobacco Never   Tobacco Comments    quit in 1991      Lipid Profile Cholesterol   Date Value Ref Range Status   12/26/2021 161 <200 MG/DL Final     HDL   Date Value Ref Range Status   12/26/2021 71 >40 MG/DL Final     LDL   Date Value Ref Range Status   12/26/2021 87 <100 mg/dL Final     Triglycerides   Date Value Ref Range Status   12/26/2021 79 <150 MG/DL Final      Blood Sugar Hemoglobin A1C   Date Value Ref Range Status   12/26/2021 5.9 (H) 4.0 - 5.7 % Final     Comment:     The ADA recommends that most patients with type 1 and type 2 diabetes maintain   an A1c level <7%.       Glucose   Date Value Ref Range Status   12/26/2021 109 (H) 70 - 100 MG/DL Final   45/40/9811 914 70 - 100 MG/DL Final   78/29/5621 308 (H) 70 - 100 MG/DL Final     Glucose, POC   Date Value Ref Range Status   01/03/2017 138 (H) 70 - 100 MG/DL Final     Glucose POC   Date Value Ref Range Status   10/02/2021 112 (A) 65 - 110 mg/dL Final   65/78/4696 93 65 - 110 mg/dL Final          Problems Addressed Today  Encounter Diagnoses   Name Primary?    Pure hypercholesterolemia Yes    Primary hypertension     Mixed hyperlipidemia     Chronic diastolic heart failure (HCC)     Pericardial effusion     Hypercalcemia     History of COVID-19     Fen-phen history     Class 3 obesity (HCC)     Hypertension, unspecified type     Bilateral edema of lower extremity     Cardiomyopathy, unspecified type (HCC)        Assessment and Plan     Assessment:    1.  Combined systolic/diastolic heart failure  The 2D echo Doppler study performed in October 2022 demonstrated LVEF = 45%  2.  Bilateral lower extremity edema due to #1-patient is on torsemide, she takes it 4 out of 7 days a week  3.  Morbid obesity, elevated BMI = 44.32 kg/m?  4.  History of pericardial effusion-resolved, patient did undergo pericardiocentesis  5.  Abnormal echocardiogram-this was performed in October 2022 demonstrated LVEF = 45%, global hypokinesis  6.  Primary hypertension-suboptimally controlled, this also could have a contributing factor to patient's diastolic dysfunction  7.  History of lung cancer, status postsurgical intervention, in remission  8.  Family history of premature CAD  9.  History of fen-phen use    Plan:    1.  I recommend to take torsemide 20 mg p.o. daily  2.  I prescribed potassium supplementation-Micro-K 20 mEq p.o. daily or 10 mEq p.o. twice daily  3.  I recommended a strict low-sodium diet  4.  2D echo Doppler study-we will follow-up on the results and will call the patient with further recommendations  5.  Follow-up office visit in approximately 8 to 10 months.    Total Time Today was 40 minutes in the following activities: Preparing to see the patient, Obtaining and/or reviewing separately obtained history, Performing a medically appropriate examination and/or evaluation, Counseling and educating the patient/family/caregiver, Ordering medications, tests, or procedures, Referring and communication with other health care professionals (when not separately reported), Documenting clinical information in the electronic or other health record, Independently interpreting results (not separately reported) and communicating results to the patient/family/caregiver, and Care coordination (not separately reported)          Current Medications (including today's revisions)   acetaminophen (TYLENOL) 325 mg tablet Take two tablets by mouth every 6 hours as needed. For fever or pain.    ascorbic acid (VITAMIN C) 500 mg tablet Take one tablet by mouth daily.    atorvastatin (LIPITOR) 10 mg tablet TAKE 1 TABLET BY MOUTH EVERY DAY    carvediloL (COREG) 3.125 mg tablet TAKE ONE TABLET BY MOUTH TWICE DAILY WITH MEALS. TAKE WITH FOOD.    cetirizine (ZYRTEC) 10 mg tablet Take one tablet by mouth daily.    cholecalciferol (VITAMIN D-3) 1,000 units tablet Take one tablet by mouth daily.    ciprofloxacin (CIPRO) 250 mg tablet Take one tablet by mouth twice daily.    cranberry conc-ascorbic acid 4,200-20 mg cap Take 2 capsules by mouth daily.    d-mannose (AZO D-MANNOSE) 500 mg cap Take 1,000 mg by mouth daily.    fexofenadine HCl (MUCINEX ALLERGY PO) Take 1 tablet by mouth daily.    gabapentin (NEURONTIN) 100 mg capsule TAKE 2 CAPSULES BY MOUTH TWICE A DAY (Patient taking differently: one capsule twice daily.)    glucosamine/msm/chondrt/C/hyal (GLUCOSAMINE-CHONDROITIN-MSM PO) Take 1 tablet by mouth daily.    levothyroxine (SYNTHROID) 88 mcg tablet TAKE 1 TABLET BY MOUTH EVERY DAY IN THE MORNING    losartan (COZAAR) 50 mg tablet TAKE 1 TABLET BY MOUTH TWICE A DAY    melatonin 10 mg cap Take one capsule by mouth daily.    meloxicam (MOBIC) 15 mg tablet TAKE 1 TABLET BY MOUTH EVERY DAY AS NEEDED FOR PAIN    metFORMIN (GLUCOPHAGE) 500 mg tablet TAKE 1 TAB BY MOUTH ONCE DAILY FOR 2 WEEKS THEN INCREASE TO TWICE DAILY AFTER MEALS    oxybutynin XL (DITROPAN XL) 10 mg tablet Take one tablet by mouth twice daily. Do not cut/ crush/ chew    pantoprazole DR (PROTONIX) 40 mg tablet TAKE 1 TABLET BY MOUTH TWICE A DAY    polycarbophil (FIBERCON) 625 mg tablet Take one tablet by mouth twice daily.    polyethylene glycol 3350 (MIRALAX) 17 g packet Take one packet by mouth daily as needed.    Psyllium Husk 0.52 gram cap Take one capsule by mouth twice daily.    torsemide (DEMADEX) 20 mg tablet Take one tablet by mouth daily. Indications: visible water retention    vit C,E-Zn-coppr-lutein-zeaxan (PRESERVISION AREDS-2) 250-200-40-1 mg-unit-mg-mg cap Take 1 capsule by mouth twice daily.    Zinc 50 mg tab Take one tablet by mouth daily.

## 2022-02-19 NOTE — Patient Instructions
Thank you for visiting our office today.    We would like to make the following medication adjustments:    Take torsemide daily with potassium 10 mEq twice daily      Otherwise continue the same medications as you have been doing.          We will be pursuing the following tests after your appointment today:       Orders Placed This Encounter    2D + DOPPLER ECHO    potassium chloride (KLOR-CON SPRINKLE) 10 mEq capsule          Please call us in the meantime with any questions or concerns.        Please allow 5-7 business days for our providers to review your results. All normal results will go to MyChart. If you do not have Mychart, it is strongly recommended to get this so you can easily view all your results. If you do not have mychart, we will attempt to call you once with normal lab and testing results. If we cannot reach you by phone with normal results, we will send you a letter.  If you have not heard the results of your testing after one week please give [place] a call.       Your Cardiovascular Medicine [place] Team Richardson Landry, Rene Kocher, Threasa Beards, and [place])  phone number is [phone].

## 2022-03-13 ENCOUNTER — Encounter: Admit: 2022-03-13 | Discharge: 2022-03-13 | Payer: MEDICARE

## 2022-03-20 ENCOUNTER — Encounter: Admit: 2022-03-20 | Discharge: 2022-03-20 | Payer: MEDICARE

## 2022-03-20 ENCOUNTER — Ambulatory Visit: Admit: 2022-03-20 | Discharge: 2022-03-20 | Payer: MEDICARE

## 2022-03-20 DIAGNOSIS — I10 Essential (primary) hypertension: Secondary | ICD-10-CM

## 2022-03-20 DIAGNOSIS — Z8616 History of COVID-19: Secondary | ICD-10-CM

## 2022-03-20 DIAGNOSIS — E7800 Pure hypercholesterolemia, unspecified: Secondary | ICD-10-CM

## 2022-03-20 DIAGNOSIS — I429 Cardiomyopathy, unspecified: Secondary | ICD-10-CM

## 2022-03-20 DIAGNOSIS — R600 Localized edema: Secondary | ICD-10-CM

## 2022-03-20 DIAGNOSIS — I5032 Chronic diastolic (congestive) heart failure: Secondary | ICD-10-CM

## 2022-03-20 DIAGNOSIS — I3139 Pericardial effusion: Secondary | ICD-10-CM

## 2022-03-20 DIAGNOSIS — Z9229 Personal history of other drug therapy: Secondary | ICD-10-CM

## 2022-03-20 DIAGNOSIS — E782 Mixed hyperlipidemia: Secondary | ICD-10-CM

## 2022-03-20 MED ORDER — PERFLUTREN LIPID MICROSPHERES 1.1 MG/ML IV SUSP
1-10 mL | Freq: Once | INTRAVENOUS | 0 refills | Status: CP | PRN
Start: 2022-03-20 — End: ?

## 2022-03-20 MED ORDER — SODIUM CHLORIDE 0.9 % IJ SOLN
10 mL | Freq: Once | INTRAVENOUS | 0 refills | Status: CP
Start: 2022-03-20 — End: ?

## 2022-03-21 ENCOUNTER — Encounter: Admit: 2022-03-21 | Discharge: 2022-03-21 | Payer: MEDICARE

## 2022-03-21 NOTE — Telephone Encounter
Left message with results and recommendations.  Left callback number for any questions or concerns.

## 2022-03-21 NOTE — Telephone Encounter
-----  Message from Vertell Novak, MD sent at 03/21/2022 12:50 PM CST -----  You can call the patient to let her know that the echocardiogram showed mildly reduced LVEF = 45% is just like before.  Will continue the same medications.  I think she can follow-up with me in another 6 months to 1 year.    Thank you      ----- Message -----  From: Jeanette Caprice, MD  Sent: 03/20/2022   4:48 PM CST  To: Vertell Novak, MD

## 2022-04-07 ENCOUNTER — Encounter: Admit: 2022-04-07 | Discharge: 2022-04-07 | Payer: MEDICARE

## 2022-04-07 MED ORDER — ATORVASTATIN 10 MG PO TAB
ORAL_TABLET | 3 refills | Status: AC
Start: 2022-04-07 — End: ?

## 2022-05-16 ENCOUNTER — Encounter: Admit: 2022-05-16 | Discharge: 2022-05-16 | Payer: MEDICARE

## 2022-05-16 MED ORDER — CARVEDILOL 3.125 MG PO TAB
3.125 mg | ORAL_TABLET | Freq: Two times a day (BID) | ORAL | 2 refills | 90.00000 days | Status: AC
Start: 2022-05-16 — End: ?

## 2022-05-17 ENCOUNTER — Encounter: Admit: 2022-05-17 | Discharge: 2022-05-17 | Payer: MEDICARE

## 2022-05-17 MED ORDER — GABAPENTIN 100 MG PO CAP
100 mg | ORAL_CAPSULE | Freq: Two times a day (BID) | ORAL | 3 refills | Status: AC
Start: 2022-05-17 — End: ?

## 2022-05-17 MED ORDER — LOSARTAN 50 MG PO TAB
ORAL_TABLET | ORAL | 2 refills | 90.00000 days | Status: AC
Start: 2022-05-17 — End: ?

## 2022-05-29 ENCOUNTER — Encounter: Admit: 2022-05-29 | Discharge: 2022-05-29 | Payer: MEDICARE

## 2022-05-29 ENCOUNTER — Ambulatory Visit: Admit: 2022-05-29 | Discharge: 2022-05-30 | Payer: MEDICARE

## 2022-05-29 ENCOUNTER — Ambulatory Visit: Admit: 2022-05-29 | Discharge: 2022-05-29 | Payer: MEDICARE

## 2022-05-29 DIAGNOSIS — E669 Obesity, unspecified: Secondary | ICD-10-CM

## 2022-05-29 DIAGNOSIS — M25569 Pain in unspecified knee: Secondary | ICD-10-CM

## 2022-05-29 DIAGNOSIS — F331 Major depressive disorder, recurrent, moderate: Secondary | ICD-10-CM

## 2022-05-29 DIAGNOSIS — E785 Hyperlipidemia, unspecified: Secondary | ICD-10-CM

## 2022-05-29 DIAGNOSIS — M1990 Unspecified osteoarthritis, unspecified site: Secondary | ICD-10-CM

## 2022-05-29 DIAGNOSIS — E119 Type 2 diabetes mellitus without complications: Secondary | ICD-10-CM

## 2022-05-29 DIAGNOSIS — I10 Essential (primary) hypertension: Secondary | ICD-10-CM

## 2022-05-29 DIAGNOSIS — I5032 Chronic diastolic (congestive) heart failure: Secondary | ICD-10-CM

## 2022-05-29 DIAGNOSIS — E079 Disorder of thyroid, unspecified: Secondary | ICD-10-CM

## 2022-05-29 DIAGNOSIS — D649 Anemia, unspecified: Secondary | ICD-10-CM

## 2022-05-29 DIAGNOSIS — M545 Low back pain: Secondary | ICD-10-CM

## 2022-05-29 DIAGNOSIS — C7A090 Malignant carcinoid tumor of the bronchus and lung: Secondary | ICD-10-CM

## 2022-05-29 DIAGNOSIS — E039 Hypothyroidism, unspecified: Secondary | ICD-10-CM

## 2022-05-29 DIAGNOSIS — D539 Nutritional anemia, unspecified: Secondary | ICD-10-CM

## 2022-05-29 DIAGNOSIS — G4733 Obstructive sleep apnea (adult) (pediatric): Secondary | ICD-10-CM

## 2022-05-29 DIAGNOSIS — R600 Localized edema: Secondary | ICD-10-CM

## 2022-05-29 DIAGNOSIS — J189 Pneumonia, unspecified organism: Secondary | ICD-10-CM

## 2022-05-29 DIAGNOSIS — Z86010 Personal history of colonic polyps: Secondary | ICD-10-CM

## 2022-05-29 DIAGNOSIS — K929 Disease of digestive system, unspecified: Secondary | ICD-10-CM

## 2022-05-29 DIAGNOSIS — N751 Abscess of Bartholin's gland: Secondary | ICD-10-CM

## 2022-05-29 DIAGNOSIS — K219 Gastro-esophageal reflux disease without esophagitis: Secondary | ICD-10-CM

## 2022-05-29 DIAGNOSIS — G5603 Carpal tunnel syndrome, bilateral upper limbs: Secondary | ICD-10-CM

## 2022-05-29 DIAGNOSIS — C801 Malignant (primary) neoplasm, unspecified: Secondary | ICD-10-CM

## 2022-05-29 DIAGNOSIS — F99 Mental disorder, not otherwise specified: Secondary | ICD-10-CM

## 2022-05-29 LAB — IRON + BINDING CAPACITY + %SAT+ FERRITIN
% SATURATION: 26 % — ABNORMAL LOW (ref 28–42)
FERRITIN: 41 ng/mL (ref 10–200)
IRON BINDING: 258 ug/dL — ABNORMAL LOW (ref 270–380)
IRON: 66 ug/dL (ref 50–160)

## 2022-05-29 LAB — BASIC METABOLIC PANEL
POTASSIUM: 4 MMOL/L (ref 3.5–5.1)
SODIUM: 139 MMOL/L (ref 137–147)

## 2022-05-29 LAB — CBC
HEMATOCRIT: 33 % — ABNORMAL LOW (ref 36–45)
HEMOGLOBIN: 11 g/dL — ABNORMAL LOW (ref 12.0–15.0)
MCH: 23 pg — ABNORMAL LOW (ref 26–34)
MCHC: 33 g/dL — ABNORMAL HIGH (ref 32.0–36.0)
MCV: 69 FL — ABNORMAL LOW (ref 80–100)
MPV: 8.8 FL (ref 7–11)
PLATELET COUNT: 249 K/UL (ref 150–400)
RBC COUNT: 4.8 M/UL (ref 4.0–5.0)
RDW: 15 % — ABNORMAL HIGH (ref >60)
WBC COUNT: 9 K/UL (ref 4.5–11.0)

## 2022-05-29 LAB — MICROALB/CR RATIO-URINE RANDOM: UR CREATININE, RAN: 27 mg/dL (ref 7–56)

## 2022-05-29 LAB — MAGNESIUM: MAGNESIUM: 1.9 mg/dL (ref 1.6–2.6)

## 2022-05-29 MED ORDER — OZEMPIC 0.25 MG OR 0.5 MG (2 MG/3 ML) SC PNIJ
.25 mg | SUBCUTANEOUS | 0 refills | Status: AC
Start: 2022-05-29 — End: ?

## 2022-05-30 ENCOUNTER — Encounter: Admit: 2022-05-30 | Discharge: 2022-05-30 | Payer: MEDICARE

## 2022-05-30 DIAGNOSIS — D649 Anemia, unspecified: Secondary | ICD-10-CM

## 2022-05-30 DIAGNOSIS — D3A090 Benign carcinoid tumor of the bronchus and lung: Secondary | ICD-10-CM

## 2022-05-30 DIAGNOSIS — E119 Type 2 diabetes mellitus without complications: Secondary | ICD-10-CM

## 2022-05-30 DIAGNOSIS — I5032 Chronic diastolic (congestive) heart failure: Secondary | ICD-10-CM

## 2022-05-30 NOTE — Progress Notes
Telehealth Visit:  Date of Service: 06/03/2022       Subjective:             Alicia Fox is a 72 y.o. female.      History of Present Illness  Alicia Fox presents to thoracic surgery clinic for follow up CT scan of her chest for 12 month cancer surveillance. She presented to thoracic surgery for evaluation of biopsy-proven left lower lobe carcinoid.  On 03/21/17 she underwent a Robotic assisted left lower lobectomy under the direction of Dr. Particia Nearing.  Pathology impression:  Lung: Typical carcinoid tumor.   Greatest dimension (centimeters): 3.0 cm. Additional dimensions (centimeters): 2.5 x 2.5 cm.  Pathologic Stage Classification (pTNM, AJCC 8th Edition): pT1c N0     05/30/22 ct c a p   CHEST:   1. Left lower lobectomy without recurrent mass or metastatic disease.      ABDOMEN AND PELVIS:   1.  No abdominopelvic metastatic disease.     Breathing is good, no cough, shortness of breath, or wheezing. No issues with diarrhea. She has 2 grandchildren weddings this summer. No hospitalizations since last visit. She has mac degenteration so following retina specialist. Back issues, uses cane at times. Helping care for other family/friends recently that have been hospitalized.           ROS  Constitutional: Negative.   HENT: Negative.     Eyes: Negative.    Cardiovascular: Negative.    Respiratory: Negative.     Endocrine: Negative.    Hematologic/Lymphatic: Negative.    Skin: Negative.    Musculoskeletal: Negative.    Gastrointestinal: Negative.    Genitourinary: Negative.    Neurological: Negative.    Psychiatric/Behavioral: Negative.     Allergic/Immunologic: Negative.       Medical History:   Diagnosis Date    Arthritis     lumbar, knees    Back pain 1965    Cancer (HCC)     breast    Cancer of lung (HCC) 2018    Carpal tunnel syndrome on both sides 2018    L > R    Depression     Seasonal and close family and friends deaths    Disorder of thyroid gland     hypothyroid    Diverticulitis DM (diabetes mellitus) (HCC)     Gastrointestinal disorder     gerd at times-under control with meds    GERD (gastroesophageal reflux disease)     HTN (hypertension)     Hx of adenomatous colonic polyps     Hyperlipidemia     Hypothyroid     Knee pain     right    Low back pain     Obesity     OSA on CPAP     Osteoporosis 2021    Osteopenia    Pneumonia     bilateral bacterial    Psychiatric illness     depression    Stomach disorder     Unspecified deficiency anemia 2023    Vision decreased     AMD & Cataracts       Objective:          acetaminophen (TYLENOL) 325 mg tablet Take two tablets by mouth every 6 hours as needed. For fever or pain.    ascorbic acid (VITAMIN C) 500 mg tablet Take one tablet by mouth daily.    atorvastatin (LIPITOR) 10 mg tablet TAKE 1 TABLET BY MOUTH EVERY DAY  carvediloL (COREG) 3.125 mg tablet TAKE ONE TABLET BY MOUTH TWICE DAILY WITH MEALS. TAKE WITH FOOD.    cetirizine (ZYRTEC) 10 mg tablet Take one tablet by mouth daily.    cholecalciferol (VITAMIN D-3) 1,000 units tablet Take one tablet by mouth daily.    COLLAGEN MISC Take 1 tablet by mouth daily.    cranberry conc-ascorbic acid 4,200-20 mg cap Take 2 capsules by mouth daily.    d-mannose (AZO D-MANNOSE) 500 mg cap Take 1,000 mg by mouth daily.    DIETARY SUPPLEMENT PO Take 1 tablet by mouth daily. Lymph system    fexofenadine HCl (MUCINEX ALLERGY PO) Take 1 tablet by mouth daily.    gabapentin (NEURONTIN) 100 mg capsule Take one capsule by mouth twice daily.    levothyroxine (SYNTHROID) 88 mcg tablet TAKE 1 TABLET BY MOUTH EVERY DAY IN THE MORNING    losartan (COZAAR) 50 mg tablet TAKE 1 TABLET BY MOUTH TWICE A DAY    melatonin 10 mg cap Take one capsule by mouth daily.    meloxicam (MOBIC) 15 mg tablet TAKE 1 TABLET BY MOUTH EVERY DAY AS NEEDED FOR PAIN    metFORMIN (GLUCOPHAGE) 500 mg tablet TAKE 1 TAB BY MOUTH ONCE DAILY FOR 2 WEEKS THEN INCREASE TO TWICE DAILY AFTER MEALS    multivit,iron,min/green tea xt (DAILY MULTIPLE WEIGHT LOSS PO) Take 1 tablet by mouth daily. FitSpresso    oxybutynin XL (DITROPAN XL) 10 mg tablet Take one tablet by mouth twice daily. Do not cut/ crush/ chew    pantoprazole DR (PROTONIX) 40 mg tablet TAKE 1 TABLET BY MOUTH TWICE A DAY    polycarbophil (FIBERCON) 625 mg tablet Take one tablet by mouth twice daily.    polyethylene glycol 3350 (MIRALAX) 17 g packet Take one packet by mouth daily as needed.    potassium chloride (KLOR-CON SPRINKLE) 10 mEq capsule Take one capsule by mouth twice daily.    Psyllium Husk 0.52 gram cap Take one capsule by mouth twice daily.    semaglutide (OZEMPIC) 0.25 mg or 0.5 mg (2 mg/3 mL) injection PEN Inject one-quarter mg under the skin every 7 days. Indications: type 2 diabetes mellitus    torsemide (DEMADEX) 20 mg tablet TAKE ONE TABLET BY MOUTH DAILY. INDICATIONS: VISIBLE WATER RETENTION    vit C,E-Zn-coppr-lutein-zeaxan (PRESERVISION AREDS-2) 250-200-40-1 mg-unit-mg-mg cap Take 1 capsule by mouth twice daily.    Zinc 50 mg tab Take one tablet by mouth daily.     Vitals:    06/03/22 1005   O2 Device: None (Room air)   PainSc: Zero   Weight: 110.7 kg (244 lb)   Height: 160 cm (5' 3)     Body mass index is 43.22 kg/m?Marland Kitchen     Physical Exam  Constitutional:       Appearance: Normal appearance.   HENT:      Head: Normocephalic.      Right Ear: External ear normal.      Left Ear: External ear normal.      Nose: Nose normal.   Eyes:      Conjunctiva/sclera: Conjunctivae normal.   Pulmonary:      Effort: Pulmonary effort is normal.   Musculoskeletal:      Cervical back: Neck supple.   Neurological:      General: No focal deficit present.      Mental Status: She is alert and oriented to person, place, and time.   Psychiatric:         Mood and Affect:  Mood normal.         Behavior: Behavior normal.         Thought Content: Thought content normal.         Judgment: Judgment normal.              Assessment and Plan:  1. Carcinoid tumor of left lung  CT CHEST WO CONTRAST      2. Encounter for follow-up surveillance of malignant carcinoid tumor  CT CHEST WO CONTRAST          No evidence of carcinoid recurrence. Thoroughly review scan and report. We will plan for a 1 year follow up with a CT chest for surveillance.     Micheal Likens, APRN  Cardiothoracic Surgery

## 2022-05-31 ENCOUNTER — Encounter: Admit: 2022-05-31 | Discharge: 2022-05-31 | Payer: MEDICARE

## 2022-05-31 NOTE — Telephone Encounter
Called pt to verify Telehealth appt for 06/03/22. Pt has done a TH before and is familiar with the process. Does have Mychart and Zoom downloaded on device.

## 2022-06-03 ENCOUNTER — Encounter: Admit: 2022-06-03 | Discharge: 2022-06-03 | Payer: MEDICARE

## 2022-06-03 ENCOUNTER — Ambulatory Visit: Admit: 2022-06-03 | Discharge: 2022-06-04 | Payer: MEDICARE

## 2022-06-03 DIAGNOSIS — Z08 Encounter for follow-up examination after completed treatment for malignant neoplasm: Secondary | ICD-10-CM

## 2022-06-03 DIAGNOSIS — D3A090 Benign carcinoid tumor of the bronchus and lung: Secondary | ICD-10-CM

## 2022-06-03 DIAGNOSIS — M25569 Pain in unspecified knee: Secondary | ICD-10-CM

## 2022-06-03 DIAGNOSIS — M1990 Unspecified osteoarthritis, unspecified site: Secondary | ICD-10-CM

## 2022-06-03 DIAGNOSIS — K5792 Diverticulitis of intestine, part unspecified, without perforation or abscess without bleeding: Secondary | ICD-10-CM

## 2022-06-03 DIAGNOSIS — C3490 Malignant neoplasm of unspecified part of unspecified bronchus or lung: Secondary | ICD-10-CM

## 2022-06-03 DIAGNOSIS — K319 Disease of stomach and duodenum, unspecified: Secondary | ICD-10-CM

## 2022-06-03 DIAGNOSIS — G5603 Carpal tunnel syndrome, bilateral upper limbs: Secondary | ICD-10-CM

## 2022-06-03 DIAGNOSIS — M545 Low back pain: Secondary | ICD-10-CM

## 2022-06-03 DIAGNOSIS — K219 Gastro-esophageal reflux disease without esophagitis: Secondary | ICD-10-CM

## 2022-06-03 DIAGNOSIS — K929 Disease of digestive system, unspecified: Secondary | ICD-10-CM

## 2022-06-03 DIAGNOSIS — E119 Type 2 diabetes mellitus without complications: Secondary | ICD-10-CM

## 2022-06-03 DIAGNOSIS — F32A Depression: Secondary | ICD-10-CM

## 2022-06-03 DIAGNOSIS — E785 Hyperlipidemia, unspecified: Secondary | ICD-10-CM

## 2022-06-03 DIAGNOSIS — I10 Essential (primary) hypertension: Secondary | ICD-10-CM

## 2022-06-03 DIAGNOSIS — Z86010 Personal history of colonic polyps: Secondary | ICD-10-CM

## 2022-06-03 DIAGNOSIS — C801 Malignant (primary) neoplasm, unspecified: Secondary | ICD-10-CM

## 2022-06-03 DIAGNOSIS — E039 Hypothyroidism, unspecified: Secondary | ICD-10-CM

## 2022-06-03 DIAGNOSIS — F99 Mental disorder, not otherwise specified: Secondary | ICD-10-CM

## 2022-06-03 DIAGNOSIS — G4733 Obstructive sleep apnea (adult) (pediatric): Secondary | ICD-10-CM

## 2022-06-03 DIAGNOSIS — E669 Obesity, unspecified: Secondary | ICD-10-CM

## 2022-06-03 DIAGNOSIS — D539 Nutritional anemia, unspecified: Secondary | ICD-10-CM

## 2022-06-03 DIAGNOSIS — M549 Dorsalgia, unspecified: Secondary | ICD-10-CM

## 2022-06-03 DIAGNOSIS — J189 Pneumonia, unspecified organism: Secondary | ICD-10-CM

## 2022-06-03 DIAGNOSIS — H547 Unspecified visual loss: Secondary | ICD-10-CM

## 2022-06-03 DIAGNOSIS — M810 Age-related osteoporosis without current pathological fracture: Secondary | ICD-10-CM

## 2022-06-03 DIAGNOSIS — E079 Disorder of thyroid, unspecified: Secondary | ICD-10-CM

## 2022-06-04 ENCOUNTER — Encounter: Admit: 2022-06-04 | Discharge: 2022-06-04 | Payer: MEDICARE

## 2022-06-04 DIAGNOSIS — Z135 Encounter for screening for eye and ear disorders: Secondary | ICD-10-CM

## 2022-06-05 ENCOUNTER — Encounter: Admit: 2022-06-05 | Discharge: 2022-06-05 | Payer: MEDICARE

## 2022-06-09 ENCOUNTER — Encounter: Admit: 2022-06-09 | Discharge: 2022-06-09 | Payer: MEDICARE

## 2022-06-09 MED ORDER — PANTOPRAZOLE 40 MG PO TBEC
ORAL_TABLET | ORAL | 3 refills | 90.00000 days | Status: AC
Start: 2022-06-09 — End: ?

## 2022-06-09 MED ORDER — LEVOTHYROXINE 88 MCG PO TAB
ORAL_TABLET | ORAL | 3 refills | 30.00000 days | Status: AC
Start: 2022-06-09 — End: ?

## 2022-06-15 ENCOUNTER — Encounter: Admit: 2022-06-15 | Discharge: 2022-06-15 | Payer: MEDICARE

## 2022-06-15 MED ORDER — POTASSIUM CHLORIDE 10 MEQ PO CPER
10 meq | ORAL_CAPSULE | Freq: Two times a day (BID) | ORAL | 1 refills
Start: 2022-06-15 — End: ?

## 2022-06-25 ENCOUNTER — Encounter: Admit: 2022-06-25 | Discharge: 2022-06-25 | Payer: MEDICARE

## 2022-06-25 NOTE — Telephone Encounter
Called [facility] in [place], [place]. Spoke to MR and they will re-send records and images via fax.  Will watch for.

## 2022-07-05 ENCOUNTER — Encounter: Admit: 2022-07-05 | Discharge: 2022-07-05 | Payer: MEDICARE

## 2022-07-05 MED ORDER — MELOXICAM 15 MG PO TAB
ORAL_TABLET | ORAL | 1 refills | 30.00000 days | Status: AC
Start: 2022-07-05 — End: ?

## 2022-07-05 NOTE — Telephone Encounter
Refill request for: Meloxicam  Last Office Visit: 05/29/2022  Next Office Visit: 12/31/2022  Last Labs: 05/29/2022    Routing to Dr. Larina Bras for approval/denial.

## 2022-09-04 ENCOUNTER — Encounter: Admit: 2022-09-04 | Discharge: 2022-09-04 | Payer: MEDICARE

## 2022-09-10 ENCOUNTER — Encounter: Admit: 2022-09-10 | Discharge: 2022-09-10 | Payer: MEDICARE

## 2022-09-10 DIAGNOSIS — H547 Unspecified visual loss: Secondary | ICD-10-CM

## 2022-09-10 DIAGNOSIS — K5792 Diverticulitis of intestine, part unspecified, without perforation or abscess without bleeding: Secondary | ICD-10-CM

## 2022-09-10 DIAGNOSIS — F32A Depression: Secondary | ICD-10-CM

## 2022-09-10 DIAGNOSIS — M25569 Pain in unspecified knee: Secondary | ICD-10-CM

## 2022-09-10 DIAGNOSIS — G5603 Carpal tunnel syndrome, bilateral upper limbs: Secondary | ICD-10-CM

## 2022-09-10 DIAGNOSIS — I10 Essential (primary) hypertension: Secondary | ICD-10-CM

## 2022-09-10 DIAGNOSIS — G4733 Obstructive sleep apnea (adult) (pediatric): Secondary | ICD-10-CM

## 2022-09-10 DIAGNOSIS — M545 Low back pain: Secondary | ICD-10-CM

## 2022-09-10 DIAGNOSIS — Z136 Encounter for screening for cardiovascular disorders: Secondary | ICD-10-CM

## 2022-09-10 DIAGNOSIS — U071 Pneumonia due to COVID-19 virus: Secondary | ICD-10-CM

## 2022-09-10 DIAGNOSIS — D539 Nutritional anemia, unspecified: Secondary | ICD-10-CM

## 2022-09-10 DIAGNOSIS — M810 Age-related osteoporosis without current pathological fracture: Secondary | ICD-10-CM

## 2022-09-10 DIAGNOSIS — Z23 Encounter for immunization: Secondary | ICD-10-CM

## 2022-09-10 DIAGNOSIS — Z86010 Personal history of colonic polyps: Secondary | ICD-10-CM

## 2022-09-10 DIAGNOSIS — F99 Mental disorder, not otherwise specified: Secondary | ICD-10-CM

## 2022-09-10 DIAGNOSIS — C3490 Malignant neoplasm of unspecified part of unspecified bronchus or lung: Secondary | ICD-10-CM

## 2022-09-10 DIAGNOSIS — K219 Gastro-esophageal reflux disease without esophagitis: Secondary | ICD-10-CM

## 2022-09-10 DIAGNOSIS — E669 Obesity, unspecified: Secondary | ICD-10-CM

## 2022-09-10 DIAGNOSIS — M549 Dorsalgia, unspecified: Secondary | ICD-10-CM

## 2022-09-10 DIAGNOSIS — E079 Disorder of thyroid, unspecified: Secondary | ICD-10-CM

## 2022-09-10 DIAGNOSIS — R7309 Other abnormal glucose: Secondary | ICD-10-CM

## 2022-09-10 DIAGNOSIS — R0602 Shortness of breath: Secondary | ICD-10-CM

## 2022-09-10 DIAGNOSIS — K319 Disease of stomach and duodenum, unspecified: Secondary | ICD-10-CM

## 2022-09-10 DIAGNOSIS — J189 Pneumonia, unspecified organism: Secondary | ICD-10-CM

## 2022-09-10 DIAGNOSIS — I5032 Chronic diastolic (congestive) heart failure: Secondary | ICD-10-CM

## 2022-09-10 DIAGNOSIS — E119 Type 2 diabetes mellitus without complications: Secondary | ICD-10-CM

## 2022-09-10 DIAGNOSIS — C7A090 Malignant carcinoid tumor of the bronchus and lung: Secondary | ICD-10-CM

## 2022-09-10 DIAGNOSIS — M1990 Unspecified osteoarthritis, unspecified site: Secondary | ICD-10-CM

## 2022-09-10 DIAGNOSIS — E039 Hypothyroidism, unspecified: Secondary | ICD-10-CM

## 2022-09-10 DIAGNOSIS — E785 Hyperlipidemia, unspecified: Secondary | ICD-10-CM

## 2022-09-10 DIAGNOSIS — E782 Mixed hyperlipidemia: Secondary | ICD-10-CM

## 2022-09-10 DIAGNOSIS — C801 Malignant (primary) neoplasm, unspecified: Secondary | ICD-10-CM

## 2022-09-10 DIAGNOSIS — K929 Disease of digestive system, unspecified: Secondary | ICD-10-CM

## 2022-09-10 DIAGNOSIS — Z8616 History of COVID-19: Secondary | ICD-10-CM

## 2022-09-10 NOTE — Progress Notes
Date of Service: 09/10/2022    Alicia Fox Alicia Fox is a 72 y.o. female.       HPI      Alicia Fox is a 72 y.o. female HFrEF, mildly decreased LVEF = 45% by echocardiogram imaging study (normal by nuclear stress test performed in October 2022), primary hypertension, hyperlipidemia, obesity, BMI = 44.29 kg/m?, history of left lower lobe carcinoid tumor, status post robotic surgery in January 2019 and currently in complete remission, history of pericardial effusion detected on an echocardiogram performed in May 2021, status post pericardiocentesis (the follow-up studies did not demonstrate any recurrent effusion), history of COVID-19 pneumonia (recovered), history of diabetes mellitus type 2 (on metformin).    Patient is doing well from a cardiac standpoint.  Her weight is stagnant/mildly increased.  She has not experienced any symptoms of chest pain and no heart palpitations.    She was initiated on semaglutide for diabetes treatment and also weight reduction.  Patient could not afford this medication due to cost.  She continues to work on caloric intake and tries to lose weight on her own.         Vitals:    09/10/22 1108   BP: (!) 150/95   BP Source: Arm, Left Upper   Pulse: 84   SpO2: 96%   O2 Device: None (Room air)   PainSc: Zero   Weight: 113.4 kg (250 lb)   Height: 160 cm (5' 3)     Body mass index is 44.29 kg/m?Marland Kitchen     Past Medical History  Patient Active Problem List    Diagnosis Date Noted    History of COVID-19 05/13/2019     Priority: Medium     She developed COVID in 12/20 with week long hospitalization and need for oxygen/steroids but not sent home on oxygen.  She apparently had pericardial effusion also as complication but no recurrence since single pericardiocentesis in March 2021.       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    Type 2 diabetes mellitus without complication, without long-term current use of insulin (HCC) 12/26/2021    Stress incontinence of urine 12/20/2020    Multilevel degenerative disc disease 11/21/2020    Other insomnia 08/16/2020    Elevated hemoglobin A1c 06/01/2020    Bilateral primary osteoarthritis of knee 08/10/2019    Hooded upper eyelid 08/04/2019    Other headache syndrome 05/28/2019    COVID-19 vaccine administered 05/28/2019    Shortness of breath with exposure to severe acute respiratory syndrome coronavirus 2 (SARS-CoV-2) 04/05/2019    Morbid obesity (HCC) 02/18/2019    Chest wall pain 02/18/2019    Pneumonia due to COVID-19 virus 02/15/2019    COVID-19 02/05/2019    OSA (obstructive sleep apnea) 02/17/2018     Split night Pap titration dated 10/21/2017 at the [place] sleep lab showed severe obstructive sleep apnea with AHI of 105.6 events per hour successful CPAP titration 12 cmH2O using full facemask elevated frequency of.  Limb movement with index of 30.8 events per hour and nocturnal hypoxemia with oxygen desaturation nadir of 77% oxygen saturation below 88% for about 99.1 minutes    Pt set up with CPAP @ 12cm H2O on 12/04/2017.  DME: [name]      Carcinoid tumor of left lung 12/17/2016     On 03/21/17 she underwent a Robotic assisted left lower lobectomy under the direction of  Dr. Particia Nearing.  Pathology impression:  Lung: Typical carcinoid tumor.   Greatest dimension (centimeters): 3.0 cm. Additional dimensions (centimeters): 2.5 x 2.5 cm.  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   HENT: Negative.     Eyes: Negative.    Cardiovascular:  Positive for dyspnea on exertion and leg swelling.   Respiratory: Negative.     Endocrine: Negative.    Hematologic/Lymphatic: Negative.    Skin: Negative.    Musculoskeletal:  Positive for back pain and falls.   Gastrointestinal: Negative.    Genitourinary: Negative.    Neurological: Negative.    Psychiatric/Behavioral: Negative.     Allergic/Immunologic: Negative.        Physical Exam  General Appearance: obese, elevated BMI = 44.29  kg/m?  Skin: warm, moist, no ulcers or xanthomas  Eyes: conjunctivae and lids normal, pupils are equal and round  Lips & Oral Mucosa: no pallor or cyanosis  Neck Veins: neck veins are flat, neck veins are not distended  Chest Inspection: chest is normal in appearance  Respiratory Effort: breathing comfortably, no respiratory distress  Auscultation/Percussion: lungs clear to auscultation, no rales or rhonchi, no wheezing  Cardiac Rhythm: regular rhythm and normal rate  Cardiac Auscultation: S1, S2 normal, no rub, no gallop  Murmurs: no murmur  Carotid Arteries: normal carotid upstroke bilaterally, no bruit  Abdominal aorta: could not be examined due to obese adomen  Lower Extremity Edema: no lower extremity edema  Abdominal Exam: soft, non-tender, no masses, bowel sounds normal  Liver & Spleen: no organomegaly  Language and Memory: patient responsive and seems to comprehend information  Neurologic Exam: neurological assessment grossly intact    Cardiovascular Studies  Twelve-lead EKG demonstrates normal sinus rhythm, nonspecific T wave abnormality, no axis deviation, ventricular rate 80 bpm    Cardiovascular Health Factors  Vitals BP Readings from Last 3 Encounters:   09/10/22 (!) 150/95   05/29/22 136/84   03/20/22 (!) 155/82     Wt Readings from Last 3 Encounters:   09/10/22 113.4 kg (250 lb)   06/03/22 110.7 kg (244 lb)   05/29/22 114.1 kg (251 lb 8 oz)     BMI Readings from Last 3 Encounters:   09/10/22 44.29 kg/m?   06/03/22 43.22 kg/m?   05/29/22 43.47 kg/m?      Smoking Social History     Tobacco Use   Smoking Status Former    Current packs/day: 0.00    Average packs/day: 0.5 packs/day for 15.0 years (7.5 ttl pk-yrs)    Types: Cigarettes    Start date: 03/04/1974    Quit date: 05/02/1989    Years since quitting: 33.3   Smokeless Tobacco Never   Tobacco Comments  quit in 1991      Lipid Profile Cholesterol   Date Value Ref Range Status   12/26/2021 161 <200 MG/DL Final     HDL   Date Value Ref Range Status   12/26/2021 71 >40 MG/DL Final     LDL   Date Value Ref Range Status   12/26/2021 87 <100 mg/dL Final     Triglycerides   Date Value Ref Range Status   12/26/2021 79 <150 MG/DL Final      Blood Sugar Hemoglobin A1C   Date Value Ref Range Status   05/29/2022 6.3 (H) 4.0 - 5.7 % Final     Comment:     The ADA recommends that most patients with type 1 and type 2 diabetes maintain   an A1c level <7%.       Glucose   Date Value Ref Range Status   05/29/2022 103 (H) 70 - 100 MG/DL Final   16/12/9602 540 (H) 70 - 100 MG/DL Final   98/01/9146 829 70 - 100 MG/DL Final     Glucose, POC   Date Value Ref Range Status   01/03/2017 138 (H) 70 - 100 MG/DL Final     Glucose POC   Date Value Ref Range Status   10/02/2021 112 (A) 65 - 110 mg/dL Final   56/21/3086 93 65 - 110 mg/dL Final          Problems Addressed Today  Encounter Diagnoses   Name Primary?    Primary hypertension Yes    Mixed hyperlipidemia     Chronic diastolic heart failure (HCC)     Type 2 diabetes mellitus without complication, without long-term current use of insulin (HCC)     Shortness of breath with exposure to severe acute respiratory syndrome coronavirus 2 (SARS-CoV-2)     Pneumonia due to COVID-19 virus     OSA (obstructive sleep apnea)     Morbid obesity (HCC) Malignant carcinoid tumor of lung (HCC)     History of COVID-19     Elevated hemoglobin A1c     COVID-19 vaccine administered     Screening for heart disease        Assessment and Plan      1.  Combined systolic/diastolic heart failure  The 2D echo Doppler study performed in October 2022 demonstrated LVEF = 45%  A repeat echocardiogram in June 2024-LVEF 45%,  Regadenoson MPI performed in October 2022-no ischemia, LVEF = 62%  2.  History of bilateral lower extremity edema due to #1-patient is on torsemide, she takes it 4 out of 7 days a week  3.  Morbid obesity, elevated BMI = 44.32 kg/m?  4.  History of pericardial effusion-resolved, patient did undergo pericardiocentesis  5.  Primary hypertension-suboptimally controlled, this also could have a contributing factor to patient's diastolic dysfunction  7.  History of lung cancer, status postsurgical intervention -- in remission  8.  Family history of premature CAD  9.  History of fen-phen use  10.  Hypothyroidism-on thyroid replacement therapy  11.  History of COVID-19 viral syndrome-recovered  12.  History of COVID-19 injection    Plan:    1.  Continue all current medications  2.  Continue risk factors modifications and weight reduction  3.  Follow-up office visit in March - April 2025.    Total Time Today was 40 minutes in the following activities: Preparing to see the patient, Obtaining and/or reviewing separately obtained history, Performing a medically appropriate examination and/or evaluation, Counseling  and educating the patient/family/caregiver, Ordering medications, tests, or procedures, Referring and communication with other health care professionals (when not separately reported), Documenting clinical information in the electronic or other health record, Independently interpreting results (not separately reported) and communicating results to the patient/family/caregiver, and Care coordination (not separately reported)               Current Medications (including today's revisions)   acetaminophen (TYLENOL) 325 mg tablet Take two tablets by mouth every 6 hours as needed. For fever or pain.    ascorbic acid (VITAMIN C) 500 mg tablet Take one tablet by mouth daily.    atorvastatin (LIPITOR) 10 mg tablet TAKE 1 TABLET BY MOUTH EVERY DAY    carvediloL (COREG) 3.125 mg tablet TAKE ONE TABLET BY MOUTH TWICE DAILY WITH MEALS. TAKE WITH FOOD.    cetirizine (ZYRTEC) 10 mg tablet Take one tablet by mouth daily.    cholecalciferol (VITAMIN D-3) 1,000 units tablet Take one tablet by mouth daily.    cranberry conc-ascorbic acid 4,200-20 mg cap Take 2 capsules by mouth daily.    d-mannose (AZO D-MANNOSE) 500 mg cap Take 1,000 mg by mouth daily.    fexofenadine HCl (MUCINEX ALLERGY PO) Take 1 tablet by mouth daily.    gabapentin (NEURONTIN) 100 mg capsule Take one capsule by mouth twice daily.    levothyroxine (SYNTHROID) 88 mcg tablet TAKE 1 TABLET BY MOUTH EVERY DAY IN THE MORNING    losartan (COZAAR) 50 mg tablet TAKE 1 TABLET BY MOUTH TWICE A DAY    melatonin 10 mg cap Take one capsule by mouth daily.    meloxicam (MOBIC) 15 mg tablet TAKE 1 TABLET BY MOUTH EVERY DAY AS NEEDED FOR PAIN    metFORMIN (GLUCOPHAGE) 500 mg tablet TAKE 1 TAB BY MOUTH ONCE DAILY FOR 2 WEEKS THEN INCREASE TO TWICE DAILY AFTER MEALS    oxybutynin XL (DITROPAN XL) 10 mg tablet Take one tablet by mouth twice daily. Do not cut/ crush/ chew    pantoprazole DR (PROTONIX) 40 mg tablet TAKE 1 TABLET BY MOUTH TWICE A DAY    polycarbophil (FIBERCON) 625 mg tablet Take one tablet by mouth twice daily.    polyethylene glycol 3350 (MIRALAX) 17 g packet Take one packet by mouth daily as needed.    potassium chloride (KLOR-CON SPRINKLE) 10 mEq capsule TAKE 1 CAPSULE BY MOUTH TWICE A DAY (Patient taking differently: Take one capsule by mouth daily.)    Psyllium Husk 0.52 gram cap Take one capsule by mouth twice daily.    semaglutide (OZEMPIC) 0.25 mg or 0.5 mg (2 mg/3 mL) injection PEN Inject one-quarter mg under the skin every 7 days. Indications: type 2 diabetes mellitus (Patient taking differently: Inject one-quarter mg under the skin every 7 days. ON HOLD for financial reasons  Indications: type 2 diabetes mellitus)    torsemide (DEMADEX) 20 mg tablet TAKE ONE TABLET BY MOUTH DAILY. INDICATIONS: VISIBLE WATER RETENTION    vit C,E-Zn-coppr-lutein-zeaxan (PRESERVISION AREDS-2) 250-200-40-1 mg-unit-mg-mg cap Take 1 capsule by mouth twice daily.    Zinc 50 mg tab Take one tablet by mouth daily.

## 2022-10-03 ENCOUNTER — Encounter: Admit: 2022-10-03 | Discharge: 2022-10-03 | Payer: MEDICARE

## 2022-10-03 ENCOUNTER — Ambulatory Visit: Admit: 2022-10-03 | Discharge: 2022-10-03 | Payer: MEDICARE

## 2022-10-03 DIAGNOSIS — K929 Disease of digestive system, unspecified: Secondary | ICD-10-CM

## 2022-10-03 DIAGNOSIS — E079 Disorder of thyroid, unspecified: Secondary | ICD-10-CM

## 2022-10-03 DIAGNOSIS — E669 Obesity, unspecified: Secondary | ICD-10-CM

## 2022-10-03 DIAGNOSIS — C3490 Malignant neoplasm of unspecified part of unspecified bronchus or lung: Secondary | ICD-10-CM

## 2022-10-03 DIAGNOSIS — F32A Depression: Secondary | ICD-10-CM

## 2022-10-03 DIAGNOSIS — E785 Hyperlipidemia, unspecified: Secondary | ICD-10-CM

## 2022-10-03 DIAGNOSIS — Z1231 Encounter for screening mammogram for malignant neoplasm of breast: Secondary | ICD-10-CM

## 2022-10-03 DIAGNOSIS — M1990 Unspecified osteoarthritis, unspecified site: Secondary | ICD-10-CM

## 2022-10-03 DIAGNOSIS — K5792 Diverticulitis of intestine, part unspecified, without perforation or abscess without bleeding: Secondary | ICD-10-CM

## 2022-10-03 DIAGNOSIS — H547 Unspecified visual loss: Secondary | ICD-10-CM

## 2022-10-03 DIAGNOSIS — K319 Disease of stomach and duodenum, unspecified: Secondary | ICD-10-CM

## 2022-10-03 DIAGNOSIS — D539 Nutritional anemia, unspecified: Secondary | ICD-10-CM

## 2022-10-03 DIAGNOSIS — G4733 Obstructive sleep apnea (adult) (pediatric): Secondary | ICD-10-CM

## 2022-10-03 DIAGNOSIS — M810 Age-related osteoporosis without current pathological fracture: Secondary | ICD-10-CM

## 2022-10-03 DIAGNOSIS — M549 Dorsalgia, unspecified: Secondary | ICD-10-CM

## 2022-10-03 DIAGNOSIS — Z1371 Encounter for nonprocreative screening for genetic disease carrier status: Secondary | ICD-10-CM

## 2022-10-03 DIAGNOSIS — E039 Hypothyroidism, unspecified: Secondary | ICD-10-CM

## 2022-10-03 DIAGNOSIS — F99 Mental disorder, not otherwise specified: Secondary | ICD-10-CM

## 2022-10-03 DIAGNOSIS — I10 Essential (primary) hypertension: Secondary | ICD-10-CM

## 2022-10-03 DIAGNOSIS — E119 Type 2 diabetes mellitus without complications: Secondary | ICD-10-CM

## 2022-10-03 DIAGNOSIS — C801 Malignant (primary) neoplasm, unspecified: Secondary | ICD-10-CM

## 2022-10-03 DIAGNOSIS — G5603 Carpal tunnel syndrome, bilateral upper limbs: Secondary | ICD-10-CM

## 2022-10-03 DIAGNOSIS — Z86010 Personal history of colonic polyps: Secondary | ICD-10-CM

## 2022-10-03 DIAGNOSIS — R300 Dysuria: Secondary | ICD-10-CM

## 2022-10-03 DIAGNOSIS — K219 Gastro-esophageal reflux disease without esophagitis: Secondary | ICD-10-CM

## 2022-10-03 DIAGNOSIS — J189 Pneumonia, unspecified organism: Secondary | ICD-10-CM

## 2022-10-03 DIAGNOSIS — M25569 Pain in unspecified knee: Secondary | ICD-10-CM

## 2022-10-03 DIAGNOSIS — M545 Low back pain: Secondary | ICD-10-CM

## 2022-10-03 LAB — URINALYSIS MICROSCOPIC REFLEX TO CULTURE

## 2022-10-03 LAB — URINALYSIS DIPSTICK REFLEX TO CULTURE
GLUCOSE,UA: NEGATIVE
NITRITE: NEGATIVE
PROTEIN,UA: NEGATIVE
URINE ASCORBIC ACID, UA: POSITIVE — AB
URINE BILE: NEGATIVE
URINE BLOOD: NEGATIVE
URINE KETONE: NEGATIVE
URINE PH: 7 (ref 5.0–8.0)
URINE SPEC GRAVITY: 1 (ref 1.005–1.030)

## 2022-10-05 ENCOUNTER — Encounter: Admit: 2022-10-05 | Discharge: 2022-10-05 | Payer: MEDICARE

## 2022-10-05 MED ORDER — AMOXICILLIN 500 MG PO CAP
500 mg | ORAL_CAPSULE | Freq: Two times a day (BID) | ORAL | 0 refills | 7.00000 days | Status: AC
Start: 2022-10-05 — End: ?

## 2022-10-15 ENCOUNTER — Encounter: Admit: 2022-10-15 | Discharge: 2022-10-15 | Payer: MEDICARE

## 2022-10-15 DIAGNOSIS — 1 ERRONEOUS ENCOUNTER--DISREGARD: Secondary | ICD-10-CM

## 2022-10-15 MED ORDER — OXYBUTYNIN CHLORIDE 10 MG PO TR24
10 mg | ORAL_TABLET | Freq: Two times a day (BID) | ORAL | 0 refills | 12.00000 days | Status: AC
Start: 2022-10-15 — End: ?

## 2022-10-15 NOTE — Progress Notes
This encounter was created in error. Please disregard. 

## 2022-10-17 ENCOUNTER — Encounter: Admit: 2022-10-17 | Discharge: 2022-10-17 | Payer: MEDICARE

## 2022-10-18 ENCOUNTER — Encounter: Admit: 2022-10-18 | Discharge: 2022-10-18 | Payer: MEDICARE

## 2022-10-18 NOTE — Progress Notes
Appointment Notes: extra-axial mass, neurosurgical consultation   Future Appointments   Date Time Provider Department Center   10/24/2022 10:45 AM Colin Benton, MD [facility] NeuroSurg   11/15/2022 11:30 AM Erlene Quan, APRN-NP [facility] IM   12/31/2022 10:00 AM Hiram Gash, MD [facility]     Referring Provider:  Dominica Severin, MD     Location of Films: IMAGE VIEWER     Location of Pathology Tissue:  N/a       Contact Summary:  Miosha Behe is a 72 year old female referred by her ophthalmologist for consultation regarding a recently identified extra-axial mass. 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 An MRI head was subsequently ordered that revealed an enhancing mass along the medial convexity of the left temporal lobe. Alicia Fox reports some recent headaches, but states those could be due to stress, she denies any noticeable vision changes.     Imaging:      10/10/2022 MRI head w/wo contrast: 1. A 1.8 x 2.1 x 1.5 cm (ap x lat x cc) homogenously enhancing mass along the medial convexity of the left temporal lobe with mass effect on the adjacent temporal love and associated vasogenic edema. This appears to be extra-axial and extends to the region of the left orbital apex and appears to extend into the supraorbital fissure. 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 However, metastatic lesion is a differential consideration given the assocaited vasogenic edema. 2. Moderate amount of foci of T2 and FLAIR hyperintense signal in the white matter supratentorial brain are nonspecific, but attributable to sequela of chronic microvascular ischemic white matter disease. 3. Paranasal sinus inflammatory disease. 4. Predominantly fluid-filled sella turcica (partially empty sella) is nonspecific.     Allergies reviewed and verified with the patient, and documented in Epic:  Yes    Comments: Pt reports she was scheduled to see Dr. Melene Muller this week, however, she accidentally cancelled that visit. RN instructed patient to r/s that visit.      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    Type 2 diabetes mellitus without complication, without long-term current use of insulin (HCC) 12/26/2021    Stress incontinence of urine 12/20/2020    Multilevel degenerative disc disease 11/21/2020    Other insomnia 08/16/2020    Elevated hemoglobin A1c 06/01/2020    Bilateral primary osteoarthritis of knee 08/10/2019    Hooded upper eyelid 08/04/2019    Other headache syndrome 05/28/2019    COVID-19 vaccine administered 05/28/2019    Shortness of breath with exposure to severe acute respiratory syndrome coronavirus 2 (SARS-CoV-2) 04/05/2019    Morbid obesity (HCC) 02/18/2019    Chest wall pain 02/18/2019    Pneumonia due to COVID-19 virus 02/15/2019    COVID-19 02/05/2019    OSA (obstructive sleep apnea) 02/17/2018    Carcinoid tumor of left lung 12/17/2016    Carpal tunnel syndrome of right wrist 10/22/2016    Carpal tunnel syndrome of left wrist 10/09/2016    Spondylosis of cervical region without myelopathy or radiculopathy 09/24/2016    Hypercalcemia 08/09/2016    Carpal tunnel syndrome, bilateral 08/06/2016    Abnormal CT of the head 08/06/2016    Cough 08/09/2014    [place] cardiac  risk <10% in next 10 years 08/09/2014    Bilateral edema of lower extremity 04/28/2014    Diastolic heart failure (HCC) 04/28/2014    Hyperlipemia 01/18/2010    Hypertension 07/21/2008    Fen-phen history 07/21/2008     History:  Family History   Problem Relation Name Age of Onset    Complete Hysterectomy Mother Thayer Headings 13        uterine prolaps    Hypertension Mother Thayer Headings         Covid-19 fatality Feb 2021    Atrial Fibrillation Mother Dianna Limbo [facility]     Hypertension Father Manuela Neptune         COPD, PARKINSON'S death November 14, 2016    COPD Father Manuela Neptune     Heart Failure Father Manuela Neptune     Hypertension Brother Tom         Pericardiocentesus    Kidney Cancer Brother Len Scaturro 60    Cancer-Prostate Brother Len Scaturro 60    Hypertension Brother Len Equities trader         A-fib. 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    Cancer of lung (HCC) November 14, 2016    Carpal tunnel syndrome on both sides 2016/11/14    L > R    Depression     Seasonal and close family and friends deaths    Disorder of thyroid gland     hypothyroid    Diverticulitis     DM (diabetes mellitus) (HCC)     Gastrointestinal disorder     gerd at times-under control with meds    GERD (gastroesophageal reflux disease)     HTN (hypertension)     Hx of adenomatous colonic polyps     Hyperlipidemia     Hypothyroid     Knee pain     right    Low back pain     Obesity     OSA on CPAP     Osteoporosis Nov 15, 2019    Osteopenia    Pneumonia     bilateral bacterial    Psychiatric illness     depression    Stomach disorder     Unspecified deficiency anemia 11/14/2021    Vision decreased     AMD & Cataracts     Surgical History:   Procedure Laterality Date    HX MASTECTOMY Bilateral 1986    1986-stomberg mastectomy    BREAST BIOPSY  07/1984    MASTECTOMY, PARTIAL Bilateral 12/1984    MASTECTOMY, MODIFIED RADICAL Bilateral 1987    1987-full modified    BREAST AUGMENTATION Bilateral 1987    BREAST SURGERY Bilateral 07/1985    implants    HX DILATION AND CURETTAGE  07/1994    HX HYSTERECTOMY  10/1994 complete    HX ABDOMINOPLASTY  10/1994    HIP SURGERY  02/1995    HX LIPOMA RESECTION  11/1999    outer upper left arm    ANUS SURGERY  01/2010    anal fissure    HX LIPOMA RESECTION  02/2010    inside upper right arm    LEFT DECOMPRESSION NERVE MEDIAN WITH CARPAL TUNNEL RELEASE Left 10/17/2016    Performed by Delice Lesch, MD at [facility] OR    BRONCHOSCOPY N/A 01/13/2017    Performed by Audie Box, MD at [facility] OR    BRONCHOSCOPY WITH TRANSBRONCHIAL BIOPSY N/A 01/13/2017    Performed by Audie Box, MD at [facility] OR    BRONCHOSCOPY WITH ENDOBRONCHIAL ULTRASOUND GUIDED TRANSTRACHEAL/ TRANSBRONCHIAL SAMPLING - 3 OR MORE MEDIASTINAL/ HILAR LYMPH NODE STATIONS/ STRUCTURE - RIGID N/A 01/13/2017    Performed by Audie Box, MD at [facility] OR    BRONCHOSCOPY WITH IMAGE - GUIDED NAVIGATION - FLEXIBLE N/A 01/13/2017    Performed by Audie Box, MD at [facility] OR    ROBOTIC LEFT LOWER LOBECTOMY Left 03/21/2017    Performed by Particia Nearing, MD at [facility] CVOR    Colonoscopy N/A 10/01/2019    Performed by Meyer Cory, MD at [facility] ENDO    ESOPHAGOGASTRODUODENOSCOPY WITH SPECIMEN COLLECTION BY BRUSHING/ WASHING N/A 10/01/2019    Performed by Meyer Cory, MD at [facility] ENDO    ESOPHAGOGASTRODUODENOSCOPY WITH SNARE REMOVAL TUMOR/ POLYP/ OTHER LESION - FLEXIBLE N/A 10/01/2019    Performed by Meyer Cory, MD at [facility] ENDO    ESOPHAGOGASTRODUODENOSCOPY WITH BIOPSY - FLEXIBLE N/A 10/01/2019    Performed by Meyer Cory, MD at [facility] ENDO    COLONOSCOPY WITH SNARE REMOVAL TUMOR/ POLYP/ OTHER LESION  10/01/2019    Performed by Meyer Cory, MD at [facility] ENDO    Colonoscopy N/A 10/02/2021    Performed by Meyer Cory, MD at [facility] OR    CARDIOVASCULAR STRESS TEST  2023    ECHOCARDIOGRAM PROCEDURE  2024    HX LOBECTOMY  2019    Lower left lung removed.  Cancer    HYSTERECTOMY      PERICARDIOCENTESIS  2021    Heart Failure Due to Covid; pericardial effusion    SPINE SURGERY  1993 and 1996    cervical fusion     Patient Education  Education provided to: patient  Preferred method: oral instruction  Are learners ready to learn?: Yes  Are there barriers to learning?: No        Topics Discussed  Topics discussed: Appointment details including date, time, location, parking, and check-in procedures     Ph # given to patient for follow up: Yes    Education Details  Educated by: telephone  Ed  time: 20 min  Learner's response: The patient expressed understanding of what was explained to them, participated and agreed with the present plan.;The patient has received contact information and was instructed on how to contact us if questions or concerns arise  Materials given to patient: Scheduling Letter    Alvino Chapel (Anthoney Harada) Midge Momon, Facilities manager  Phone: [phone]

## 2022-10-22 ENCOUNTER — Encounter: Admit: 2022-10-22 | Discharge: 2022-10-22 | Payer: MEDICARE

## 2022-10-24 ENCOUNTER — Encounter: Admit: 2022-10-24 | Discharge: 2022-10-24 | Payer: MEDICARE

## 2022-10-24 ENCOUNTER — Ambulatory Visit: Admit: 2022-10-24 | Discharge: 2022-10-25 | Payer: MEDICARE

## 2022-10-24 DIAGNOSIS — K319 Disease of stomach and duodenum, unspecified: Secondary | ICD-10-CM

## 2022-10-24 DIAGNOSIS — M25569 Pain in unspecified knee: Secondary | ICD-10-CM

## 2022-10-24 DIAGNOSIS — M810 Age-related osteoporosis without current pathological fracture: Secondary | ICD-10-CM

## 2022-10-24 DIAGNOSIS — F99 Mental disorder, not otherwise specified: Secondary | ICD-10-CM

## 2022-10-24 DIAGNOSIS — G4733 Obstructive sleep apnea (adult) (pediatric): Secondary | ICD-10-CM

## 2022-10-24 DIAGNOSIS — E039 Hypothyroidism, unspecified: Secondary | ICD-10-CM

## 2022-10-24 DIAGNOSIS — M5134 Other intervertebral disc degeneration, thoracic region: Secondary | ICD-10-CM

## 2022-10-24 DIAGNOSIS — E669 Obesity, unspecified: Secondary | ICD-10-CM

## 2022-10-24 DIAGNOSIS — I10 Essential (primary) hypertension: Secondary | ICD-10-CM

## 2022-10-24 DIAGNOSIS — K5792 Diverticulitis of intestine, part unspecified, without perforation or abscess without bleeding: Secondary | ICD-10-CM

## 2022-10-24 DIAGNOSIS — J189 Pneumonia, unspecified organism: Secondary | ICD-10-CM

## 2022-10-24 DIAGNOSIS — D496 Neoplasm of unspecified behavior of brain: Secondary | ICD-10-CM

## 2022-10-24 DIAGNOSIS — C3490 Malignant neoplasm of unspecified part of unspecified bronchus or lung: Secondary | ICD-10-CM

## 2022-10-24 DIAGNOSIS — G9389 Other specified disorders of brain: Secondary | ICD-10-CM

## 2022-10-24 DIAGNOSIS — M549 Dorsalgia, unspecified: Secondary | ICD-10-CM

## 2022-10-24 DIAGNOSIS — F32A Depression: Secondary | ICD-10-CM

## 2022-10-24 DIAGNOSIS — M545 Low back pain: Secondary | ICD-10-CM

## 2022-10-24 DIAGNOSIS — E785 Hyperlipidemia, unspecified: Secondary | ICD-10-CM

## 2022-10-24 DIAGNOSIS — E119 Type 2 diabetes mellitus without complications: Secondary | ICD-10-CM

## 2022-10-24 DIAGNOSIS — G4730 Sleep apnea, unspecified: Secondary | ICD-10-CM

## 2022-10-24 DIAGNOSIS — Z1371 Encounter for nonprocreative screening for genetic disease carrier status: Secondary | ICD-10-CM

## 2022-10-24 DIAGNOSIS — C801 Malignant (primary) neoplasm, unspecified: Secondary | ICD-10-CM

## 2022-10-24 DIAGNOSIS — D539 Nutritional anemia, unspecified: Secondary | ICD-10-CM

## 2022-10-24 DIAGNOSIS — M1990 Unspecified osteoarthritis, unspecified site: Secondary | ICD-10-CM

## 2022-10-24 DIAGNOSIS — R300 Dysuria: Secondary | ICD-10-CM

## 2022-10-24 DIAGNOSIS — G5603 Carpal tunnel syndrome, bilateral upper limbs: Secondary | ICD-10-CM

## 2022-10-24 DIAGNOSIS — M5136 Other intervertebral disc degeneration, lumbar region: Secondary | ICD-10-CM

## 2022-10-24 DIAGNOSIS — H547 Unspecified visual loss: Secondary | ICD-10-CM

## 2022-10-24 DIAGNOSIS — E079 Disorder of thyroid, unspecified: Secondary | ICD-10-CM

## 2022-10-24 DIAGNOSIS — T7840XA Allergy, unspecified, initial encounter: Secondary | ICD-10-CM

## 2022-10-24 DIAGNOSIS — H269 Unspecified cataract: Secondary | ICD-10-CM

## 2022-10-24 DIAGNOSIS — K219 Gastro-esophageal reflux disease without esophagitis: Secondary | ICD-10-CM

## 2022-10-24 DIAGNOSIS — K929 Disease of digestive system, unspecified: Secondary | ICD-10-CM

## 2022-10-24 DIAGNOSIS — Z86010 Personal history of colonic polyps: Secondary | ICD-10-CM

## 2022-10-24 LAB — URINALYSIS DIPSTICK REFLEX TO CULTURE
GLUCOSE,UA: NEGATIVE
LEUKOCYTES: NEGATIVE
NITRITE: NEGATIVE
PROTEIN,UA: NEGATIVE
URINE ASCORBIC ACID, UA: POSITIVE — AB
URINE BILE: NEGATIVE
URINE BLOOD: NEGATIVE
URINE KETONE: NEGATIVE
URINE PH: 7 (ref 5.0–8.0)
URINE SPEC GRAVITY: 1 (ref 1.005–1.030)

## 2022-10-24 LAB — URINALYSIS MICROSCOPIC REFLEX TO CULTURE

## 2022-10-24 NOTE — Progress Notes
Date of Service: 10/24/2022    Subjective:             Alicia Fox is a 72 y.o. female referred to [place] in consultation of a left temporal enhancing extra-axial mass.    History of Present Illness    Alicia Fox is a 72 year old female with history of heart failure, lung cancer, breast cancer, and uterine cancer.    The breast and uterine cancer history is very remote.  She underwent local resection without any chemo or radiation therapy.    She was diagnosed with lung cancer 5 years ago treated with resection without adjuvant chemoradiation therapy.    2 years ago there was some concern about possible lymph node involvement, however she has undergone serial imaging for observation under supervision of her oncologist without any signs of progression.      She was referred to ophthalmology for visual symptoms and she is being managed for macular degeneration.    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 Valarie Merino and obtaining an MRI brain.    Dr. Joneen Boers examination did not show signs of any definite compressive optic neuropathy.    Patient noted bilateral chronic blurred vision.  However she denies any visual field defects or double vision.      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  Her current MRI showed a significant increase in the size of the tumor and new surrounding edema.           Review of Systems   All other systems reviewed and are negative.    Past Medical History:   Diagnosis Date    Allergy     Codeine    Arthritis     lumbar, knees    Back pain 1965    BRCA1 negative     BRCA2 negative     Cancer (HCC) 1986    breast-  LT    Cancer of lung (HCC) 2018    Carpal tunnel syndrome on both sides 2018    L > R    Cataract 2020    Degenerative disc disease, lumbar 2023    Degenerative disc disease, thoracic 2023    Depression     Seasonal and close family and friends deaths    Disorder of thyroid gland hypothyroid    Diverticulitis     DM (diabetes mellitus) (HCC)     Gastrointestinal disorder     gerd at times-under control with meds    GERD (gastroesophageal reflux disease)     HTN (hypertension)     Hx of adenomatous colonic polyps     Hyperlipidemia     Hypothyroid     Knee pain     right    Low back pain     Obesity     OSA on CPAP     Osteoporosis 2021    Osteopenia    Pneumonia     bilateral bacterial    Psychiatric illness     depression    Sleep apnea 1999    Stomach disorder     Unspecified deficiency anemia 2023    Vision decreased     AMD & Cataracts     Surgical History:   Procedure Laterality Date    HX MASTECTOMY Bilateral 1986    1986-stomberg mastectomy    BREAST BIOPSY  07/1984    MASTECTOMY, PARTIAL Bilateral 12/1984  MASTECTOMY, MODIFIED RADICAL Bilateral 1987    1987-full modified    BREAST AUGMENTATION Bilateral 1987    BREAST SURGERY Bilateral 07/1985    implants    HX DILATION AND CURETTAGE  07/1994    HX HYSTERECTOMY  10/1994    complete    HX ABDOMINOPLASTY  10/1994    HIP SURGERY  02/1995    HX LIPOMA RESECTION  November 13, 1999    outer upper left arm    ANUS SURGERY  01/2010    anal fissure    HX LIPOMA RESECTION  02/2010    inside upper right arm    LEFT DECOMPRESSION NERVE MEDIAN WITH CARPAL TUNNEL RELEASE Left 10/17/2016    Performed by Delice Lesch, MD at [facility] OR    BRONCHOSCOPY N/A 01/13/2017    Performed by Audie Box, MD at [facility] OR    BRONCHOSCOPY WITH TRANSBRONCHIAL BIOPSY N/A 01/13/2017    Performed by Audie Box, MD at [facility] OR    BRONCHOSCOPY WITH ENDOBRONCHIAL ULTRASOUND GUIDED TRANSTRACHEAL/ TRANSBRONCHIAL SAMPLING - 3 OR MORE MEDIASTINAL/ HILAR LYMPH NODE STATIONS/ STRUCTURE - RIGID N/A 01/13/2017    Performed by Audie Box, MD at [facility] OR    BRONCHOSCOPY WITH IMAGE - GUIDED NAVIGATION - FLEXIBLE N/A 01/13/2017    Performed by Audie Box, MD at [facility] OR    ROBOTIC LEFT LOWER LOBECTOMY Left 03/21/2017    Performed by Particia Nearing, MD at [facility] CVOR    Colonoscopy N/A 10/01/2019 Performed by Meyer Cory, MD at [facility] ENDO    ESOPHAGOGASTRODUODENOSCOPY WITH SPECIMEN COLLECTION BY BRUSHING/ WASHING N/A 10/01/2019    Performed by Meyer Cory, MD at [facility] ENDO    ESOPHAGOGASTRODUODENOSCOPY WITH SNARE REMOVAL TUMOR/ POLYP/ OTHER LESION - FLEXIBLE N/A 10/01/2019    Performed by Meyer Cory, MD at [facility] ENDO    ESOPHAGOGASTRODUODENOSCOPY WITH BIOPSY - FLEXIBLE N/A 10/01/2019    Performed by Meyer Cory, MD at [facility] ENDO    COLONOSCOPY WITH SNARE REMOVAL TUMOR/ POLYP/ OTHER LESION  10/01/2019    Performed by Meyer Cory, MD at [facility] ENDO    Colonoscopy N/A 10/02/2021    Performed by Meyer Cory, MD at [facility] OR    CARDIOVASCULAR STRESS TEST  11-12-21    ECHOCARDIOGRAM PROCEDURE  11-13-22    HX LOBECTOMY  11/12/2017    Lower left lung removed.  Cancer    HYSTERECTOMY      PERICARDIOCENTESIS  2019-11-13    Heart Failure Due to Covid; pericardial effusion    SPINE SURGERY  November 13, 1991 and 1996    cervical fusion     Family History   Problem Relation Name Age of Onset    Complete Hysterectomy Mother Thayer Headings 19        uterine prolaps    Hypertension Mother Thayer Headings         Covid-19 fatality Feb 2021    Atrial Fibrillation Mother Dianna Limbo [facility]     Hypertension Father Manuela Neptune         COPD, PARKINSON'S death 11/12/2016    COPD Father Manuela Neptune     Heart Failure Father Manuela Neptune     Parkinson's  Father Manuela Neptune     Vision Loss Father Manuela Neptune         AMD    Hypertension Brother Tom         Pericardiocentesus    Kidney Cancer Brother Len Scaturro 60    Cancer-Prostate Brother Len Scaturro 60    Hypertension  Brother Winn Jock         A-fib. 2023    Cancer Brother Len Scaturro         Prostrate and kidney    Birth Defect Brother Theora Master         Mentally handicapped    Early Death Brother Theora Master         Pneumonia staph sepsis  at 9yrs    Mental Illness Brother Theora Master     Intellectual Disability Brother      Heart Attack Paternal Uncle      Cancer-Hematologic Maternal Grandfather          leukemia    Cancer-Breast Paternal Grandmother  88    Cancer-Breast Maternal Aunt  42     Social History     Socioeconomic History    Marital status: Widowed   Occupational History    Occupation: retired   Tobacco Use    Smoking status: Former     Current packs/day: 0.00     Average packs/day: 0.5 packs/day for 15.0 years (7.5 ttl pk-yrs)     Types: Cigarettes     Start date: 03/04/1974     Quit date: 05/02/1989     Years since quitting: 33.5    Smokeless tobacco: Never    Tobacco comments:     quit in 1991   Substance and Sexual Activity    Alcohol use: Not Currently    Drug use: No    Sexual activity: Not Currently     Partners: Male     Birth control/protection: Surgical, Abstinence                      Objective:         acetaminophen (TYLENOL) 325 mg tablet Take two tablets by mouth every 6 hours as needed. For fever or pain.    amoxicillin (AMOXIL) 500 mg capsule Take one capsule by mouth every 12 hours.    amoxicillin (AMOXIL) 500 mg capsule Take one capsule by mouth every 12 hours.    ascorbic acid (VITAMIN C) 500 mg tablet Take one tablet by mouth daily.    atorvastatin (LIPITOR) 10 mg tablet TAKE 1 TABLET BY MOUTH EVERY DAY    carvediloL (COREG) 3.125 mg tablet TAKE ONE TABLET BY MOUTH TWICE DAILY WITH MEALS. TAKE WITH FOOD.    cetirizine (ZYRTEC) 10 mg tablet Take one tablet by mouth daily.    cholecalciferol (VITAMIN D-3) 1,000 units tablet Take one tablet by mouth daily.    cranberry conc-ascorbic acid 4,200-20 mg cap Take 2 capsules by mouth daily.    d-mannose (AZO D-MANNOSE) 500 mg cap Take 1,000 mg by mouth daily.    fexofenadine HCl (MUCINEX ALLERGY PO) Take 1 tablet by mouth daily.    gabapentin (NEURONTIN) 100 mg capsule Take one capsule by mouth twice daily.    levothyroxine (SYNTHROID) 88 mcg tablet TAKE 1 TABLET BY MOUTH EVERY DAY IN THE MORNING    losartan (COZAAR) 50 mg tablet TAKE 1 TABLET BY MOUTH TWICE A DAY    melatonin 10 mg cap Take one capsule by mouth daily. meloxicam (MOBIC) 15 mg tablet TAKE 1 TABLET BY MOUTH EVERY DAY AS NEEDED FOR PAIN    metFORMIN (GLUCOPHAGE) 500 mg tablet TAKE 1 TAB BY MOUTH ONCE DAILY FOR 2 WEEKS THEN INCREASE TO TWICE DAILY AFTER MEALS    oxybutynin XL (DITROPAN XL) 10 mg tablet Take one tablet by mouth twice daily. Do not  cut/ crush/ chew    pantoprazole DR (PROTONIX) 40 mg tablet TAKE 1 TABLET BY MOUTH TWICE A DAY    polycarbophil (FIBERCON) 625 mg tablet Take one tablet by mouth twice daily.    polyethylene glycol 3350 (MIRALAX) 17 g packet Take one packet by mouth daily as needed.    potassium chloride (KLOR-CON SPRINKLE) 10 mEq capsule TAKE 1 CAPSULE BY MOUTH TWICE A DAY (Patient taking differently: Take one capsule by mouth daily.)    Psyllium Husk 0.52 gram cap Take one capsule by mouth twice daily.    semaglutide (OZEMPIC) 0.25 mg or 0.5 mg (2 mg/3 mL) injection PEN Inject one-quarter mg under the skin every 7 days. Indications: type 2 diabetes mellitus (Patient taking differently: Inject one-quarter mg under the skin every 7 days. ON HOLD for financial reasons  Indications: type 2 diabetes mellitus)    torsemide (DEMADEX) 20 mg tablet TAKE ONE TABLET BY MOUTH DAILY. INDICATIONS: VISIBLE WATER RETENTION    vit C,E-Zn-coppr-lutein-zeaxan (PRESERVISION AREDS-2) 250-200-40-1 mg-unit-mg-mg cap Take 1 capsule by mouth twice daily.    Zinc 50 mg tab Take one tablet by mouth daily.     Vitals:    10/24/22 1041   PainSc: Zero   Weight: 113.4 kg (250 lb)   Height: 158.8 cm (5' 2.5)     Body mass index is 45 kg/m?Marland Kitchen     Physical Exam  Neurologic Exam:  Mental status: awake and alert, appropriate affect  Cranial Nerves:  II: Visual fields intact to confrontation, left eye with APD  III, IV, VI: EOM intact with smooth pursuit, no nystagmus  V: light touch sensation intact and symmetrical in V1,V2,V3  VII: Symmetric smile and eyebrow raise  VIII: Able to hear conversation at normal volume  IX,X: no dysarthria  XI: Shoulder shrug 5/5 strength and equal  XII: tongue protrudes midline    Motor:  No pronator drift   AA EF EE G FA HF KE DF PF EHL   Left 5 5 5 5 5 5 5 5 5 5    Right 5 5 5 5 5 5 5 5 5 5    Sensory: intact to light touch  Cerebellum: normal finger to nose  Deep Tendon Reflexes:   Pa   Left 2   Right 2               Assessment and Plan:    Alicia Fox is referred for evaluation of a left middle cranial fossa tumor.  The tumor is consistent with a meningioma. The mass has increased in size over time and has new surrounding edema.  I discussed the condition with her and answered her questions.  We talked about the different treatment options.  I recommend craniotomy for resection.  Risks and benefits clearly discussed.  She verbalized understanding and agreed with the plan.  We will get cardiology clearance before surgery and will order an MRI navigation protocole.

## 2022-10-25 ENCOUNTER — Encounter: Admit: 2022-10-25 | Discharge: 2022-10-25 | Payer: MEDICARE

## 2022-10-25 ENCOUNTER — Inpatient Hospital Stay: Admit: 2022-10-25 | Discharge: 2022-10-25 | Payer: MEDICARE

## 2022-10-25 DIAGNOSIS — D496 Neoplasm of unspecified behavior of brain: Secondary | ICD-10-CM

## 2022-10-25 NOTE — Telephone Encounter
Called patient. Confirmed name and dob. Notified patient of surgery and MRI appt. date, time, and location.

## 2022-11-01 ENCOUNTER — Encounter: Admit: 2022-11-01 | Discharge: 2022-11-01 | Payer: MEDICARE

## 2022-11-01 NOTE — Telephone Encounter
Received call from patient. Confirmed name and DOB with patient. Stated that she spoke with her pharmacist who asked if she was ready to receive her flu and covid booster shot. Stated that she is scheduled for surgery with Dr. Francis Gaines and wants to know if she should get the vaccinations now or wait till after surgery. Advised patient that she wait until after the procedure for the injections unless otherwise advised by her other health care providers. Stated that she does see cardiology and pulmonology. Stated that she should reach out to both of those providers to see if the immunizations are required as soon as possible or if she can wait until around 4-6 weeks post surgery. Patient verbalized understanding and voiced no additional concerns at this time.

## 2022-11-07 ENCOUNTER — Ambulatory Visit: Admit: 2022-11-07 | Discharge: 2022-11-07 | Payer: MEDICARE

## 2022-11-07 ENCOUNTER — Encounter: Admit: 2022-11-07 | Discharge: 2022-11-07 | Payer: MEDICARE

## 2022-11-07 DIAGNOSIS — G9389 Other specified disorders of brain: Secondary | ICD-10-CM

## 2022-11-07 MED ORDER — METFORMIN 500 MG PO TAB
ORAL_TABLET | 3 refills | Status: AC
Start: 2022-11-07 — End: ?

## 2022-11-07 MED ORDER — GADOBENATE DIMEGLUMINE 529 MG/ML (0.1MMOL/0.2ML) IV SOLN
20 mL | Freq: Once | INTRAVENOUS | 0 refills | Status: CP
Start: 2022-11-07 — End: ?
  Administered 2022-11-07: 19:00:00 20 mL via INTRAVENOUS

## 2022-11-07 NOTE — Telephone Encounter
Medication Name: Metformin  Last office  visit: 05/29/2022  Next office visit: 12/31/2022  Last labs drawn: 05/29/2022    Routing to Dr. Larina Bras for approval/denial.

## 2022-11-07 NOTE — Telephone Encounter
-----   Message from Selinda Flavin, MD sent at 11/07/2022  2:53 PM CDT -----  Patient's Revised Cardiac Risk Index  is low risk for a not high risk risk surgery.   Estimated risk of adverse outcomes (perioperative/postoperative events including myocardial infarction, pulmonary edema, ventricular fibrillation, cardiac arrest or complete heart block) with noncardiac surgery is  <1% from a cardiac standpoint. This patient does not have any contraindication to undergo general anesthesia and the planned surgical procedure.  The full risks and benefits will have to be discussed with the Anesthesiologist  and the Surgeon that will perform the procedure.       Please contact us for any further questions.       I received a notification today disease patient was diagnosed with a brain tumor, surgical intervention has been recommended.  They asked [place] to issue a statement regarding her perioperative risk, please see above.    Thank you

## 2022-11-07 NOTE — Telephone Encounter
Good Morning Dr. Avie Arenas,     My name is Diannia Ruder and I am the nurse that works with Dr. Francis Gaines here in the clinic. Dr. Francis Gaines saw her today for a brain mass. Dr. Francis Gaines is recommending surgical resection to her extra-axial mass.     Due to her cardiac history, we are wanting to gather your recommendations regarding safety and clearance for surgery.     If you could let us know your recommendations that would be greatly appreciated.     Thank you,   Leia Alf, RN     Called and spoke with Alicia Fox.  She states she is feeling okay from a cardiovascular standpoint.  She denies any chest pain, shortness of breath, palpitations or worsening edema.  She is able to complete her daily activities without needing to stop and rest.    Alicia Fox was last seen in the office on 09/10/22.  Her most recent testing is an echocardiogram in January 2024. Echo showed EF 45% which was same as previous study. Last stress test was 12/18/20.    Will have Dr. Avie Arenas review and provide cardiac risk stratification.

## 2022-11-07 NOTE — Telephone Encounter
Dorris Fetch, MD  Weston Brass; P Cvm Nurse [place]  Patient's Revised Cardiac Risk Index  is low risk for a not high risk risk surgery.   Estimated risk of adverse outcomes (perioperative/postoperative events including myocardial infarction, pulmonary edema, ventricular fibrillation, cardiac arrest or complete heart block) with noncardiac surgery is  <1% from a cardiac standpoint. This patient does not have any contraindication to undergo general anesthesia and the planned surgical procedure.  The full risks and benefits will have to be discussed with the Anesthesiologist  and the Surgeon that will perform the procedure.      Please contact us for any further questions.    Will notify Leia Alf, RN with Dr. Quinn Plowman team that cardiac risk stratification is available.

## 2022-11-11 ENCOUNTER — Encounter: Admit: 2022-11-11 | Discharge: 2022-11-11 | Payer: MEDICARE

## 2022-11-14 ENCOUNTER — Encounter: Admit: 2022-11-14 | Discharge: 2022-11-14 | Payer: MEDICARE

## 2022-11-15 ENCOUNTER — Ambulatory Visit: Admit: 2022-11-15 | Discharge: 2022-11-16 | Payer: MEDICARE

## 2022-11-15 ENCOUNTER — Encounter: Admit: 2022-11-15 | Discharge: 2022-11-15 | Payer: MEDICARE

## 2022-11-15 DIAGNOSIS — E119 Type 2 diabetes mellitus without complications: Secondary | ICD-10-CM

## 2022-11-15 DIAGNOSIS — F99 Mental disorder, not otherwise specified: Secondary | ICD-10-CM

## 2022-11-15 DIAGNOSIS — H269 Unspecified cataract: Secondary | ICD-10-CM

## 2022-11-15 DIAGNOSIS — M5136 Other intervertebral disc degeneration, lumbar region: Secondary | ICD-10-CM

## 2022-11-15 DIAGNOSIS — M810 Age-related osteoporosis without current pathological fracture: Secondary | ICD-10-CM

## 2022-11-15 DIAGNOSIS — K319 Disease of stomach and duodenum, unspecified: Secondary | ICD-10-CM

## 2022-11-15 DIAGNOSIS — F32A Depression: Secondary | ICD-10-CM

## 2022-11-15 DIAGNOSIS — Z1371 Encounter for nonprocreative screening for genetic disease carrier status: Secondary | ICD-10-CM

## 2022-11-15 DIAGNOSIS — H547 Unspecified visual loss: Secondary | ICD-10-CM

## 2022-11-15 DIAGNOSIS — M25569 Pain in unspecified knee: Secondary | ICD-10-CM

## 2022-11-15 DIAGNOSIS — J189 Pneumonia, unspecified organism: Secondary | ICD-10-CM

## 2022-11-15 DIAGNOSIS — K929 Disease of digestive system, unspecified: Secondary | ICD-10-CM

## 2022-11-15 DIAGNOSIS — Z86010 Personal history of colonic polyps: Secondary | ICD-10-CM

## 2022-11-15 DIAGNOSIS — E785 Hyperlipidemia, unspecified: Secondary | ICD-10-CM

## 2022-11-15 DIAGNOSIS — G4733 Obstructive sleep apnea (adult) (pediatric): Secondary | ICD-10-CM

## 2022-11-15 DIAGNOSIS — D539 Nutritional anemia, unspecified: Secondary | ICD-10-CM

## 2022-11-15 DIAGNOSIS — E039 Hypothyroidism, unspecified: Secondary | ICD-10-CM

## 2022-11-15 DIAGNOSIS — M1990 Unspecified osteoarthritis, unspecified site: Secondary | ICD-10-CM

## 2022-11-15 DIAGNOSIS — E079 Disorder of thyroid, unspecified: Secondary | ICD-10-CM

## 2022-11-15 DIAGNOSIS — M5134 Other intervertebral disc degeneration, thoracic region: Secondary | ICD-10-CM

## 2022-11-15 DIAGNOSIS — M545 Low back pain: Secondary | ICD-10-CM

## 2022-11-15 DIAGNOSIS — C801 Malignant (primary) neoplasm, unspecified: Secondary | ICD-10-CM

## 2022-11-15 DIAGNOSIS — T7840XA Allergy, unspecified, initial encounter: Secondary | ICD-10-CM

## 2022-11-15 DIAGNOSIS — C3490 Malignant neoplasm of unspecified part of unspecified bronchus or lung: Secondary | ICD-10-CM

## 2022-11-15 DIAGNOSIS — G4730 Sleep apnea, unspecified: Secondary | ICD-10-CM

## 2022-11-15 DIAGNOSIS — G5603 Carpal tunnel syndrome, bilateral upper limbs: Secondary | ICD-10-CM

## 2022-11-15 DIAGNOSIS — I10 Essential (primary) hypertension: Secondary | ICD-10-CM

## 2022-11-15 DIAGNOSIS — M549 Dorsalgia, unspecified: Secondary | ICD-10-CM

## 2022-11-15 DIAGNOSIS — K5792 Diverticulitis of intestine, part unspecified, without perforation or abscess without bleeding: Secondary | ICD-10-CM

## 2022-11-15 DIAGNOSIS — K219 Gastro-esophageal reflux disease without esophagitis: Secondary | ICD-10-CM

## 2022-11-15 DIAGNOSIS — E669 Obesity, unspecified: Secondary | ICD-10-CM

## 2022-11-15 DIAGNOSIS — D329 Benign neoplasm of meninges, unspecified: Secondary | ICD-10-CM

## 2022-11-15 NOTE — Pre-Anesthesia Patient Instructions
PREPROCEDURE INFORMATION    Arrival at the hospital  [facility]  [address]  [place], [place] [address]    Park in the P5 parking garage located at [address] [place], [place] [address].   If parking in the P5 garage, take the east elevators in the parking garage to the second level and walk to the entrance of the [employer].    Enter through the 1st floor main entrance and check in with Information Desk.   If you are a woman between the ages of 26 and 47, and have not had a hysterectomy, you will be asked for a urine sample prior to surgery.  Please do not urinate before arriving in the Surgery Waiting Room.  Once there, check in and let the attendant know if you need to provide a sample.    You will receive a call with your surgery arrival time between 2:30pm and 4:30pm the last business day before your procedure.  If you do not receive a call, please call [phone] before 4:30pm or [phone] after 4:30pm.    Eating or drinking before surgery  Nothing to eat after 11:00pm the night before your surgery including gum, mints and candy.  You may have clear liquids up to 2 hours before your surgery time.  Clear Liquid Examples include:  Water, Clear juice (apple or cranberry (no pulp or orange juice) - If diabetic blood sugar must be <200, Coffee and tea with or without sugar (no cream), Sports drink - Powerade/Gatorade, Soda, and Bowel Prep solutions only if ordered by surgeon.  If you have received specific instructions from your surgeon, please follow those.    Planning transportation for outpatient procedure  For your safety, you will need to arrange for a responsible ride/person to accompany you home due to sedation or anesthesia with your procedure.  An [employer], taxi or other public transportation driver is not considered a responsible person to accompany you home.    Bath/Shower Instructions  Take a bath or shower with antibacterial soap the night before and the morning of your procedure. Use clean towels.  Put on clean clothes after bath or shower.  Avoid using lotion and oils.  If you are having surgery above the waist, wear a shirt that fastens up the front.  Sleep on clean sheets if bath or shower is done the night before procedure.  Follow the instructions you received from your surgeon.    Morning of your procedure:  Brush your teeth and tongue  Do not smoke, vape, chew or user any tobacco products.  Do not shave the area where you will have surgery.  Remove nail polish, makeup and all jewelry (including piercings) before coming to the hospital.  Dress in clean, loose, comfortable clothing.    Valuables  Leave money, credit cards, jewelry, and any other valuables at home. If medications are being filled and picked up at a  pharmacy, payment will be needed. The [facility] is not responsible for the loss or breakage of personal items.    What to bring to the hospital  ID/Insurance card  Medical Device card  Official documents for legal guardianship  Copy of your Living Will, Advanced Directives, and/or Durable Power of Attorney. If you have these documents, please bring them to the admissions office on the day of your surgery to be scanned into your records.  Do not bring medications from home unless instructed by a pharmacist.  CPAP/BiPAP machine (including  all supplies)  Walker, cane, or motorized scooter  Cases for glasses/hearing aids/contact lens (bring solutions for contacts)  Stimulator remote  Insulin pump and supplies as directed by pharmacy     Notify us at [employer]: [phone] on the day of your procedure if:  You need to cancel your procedure.  You are going to be late.    Notify your surgeon if:  There is a possibility that you are pregnant.  You become ill with a cough, fever, sore throat, nausea, vomiting or flu-like symptoms.  You have any open wounds/sores that are red, painful, draining, or are new since you last saw the doctor.  You need to cancel your procedure.    Preparing to get your medications at discharge  Your surgeon may prescribe you medications to take after your procedure.  If you like the convenience of having your medications filled here at [facility], please do the following:  Go to [facility] pharmacy after your [facility] appointment to put a credit card on file.  Call [place] [place] pharmacy at [phone] (Monday-Friday 7am-9pm or Saturday and Sunday 9am-5pm) to put a credit card on file.  Bring a credit card or cash on the day of your procedure- please leave with a family member rather than bringing it into the preop area.    Current Visitor Policy:  Visitors must be free of fever and symptoms to be in our facilities.  No more than 2 visitors per patient are allowed.  Additional guidelines may vary, based on patient care area or patient's condition.  Patients in semiprivate rooms may have visitors, but visits should be coordinated so only two total visitors are in a room at a time due to space limitations.  Children younger than age 25 are allowed to visit inpatients.    Thank you for participating in your Preoperative Assessment visit today.    If you have any changes to your health or hospitalizations between now and your surgery, please call us at [phone].    Instructions given to patient via: MyChart and verbal

## 2022-11-19 ENCOUNTER — Encounter: Admit: 2022-11-19 | Discharge: 2022-11-19 | Payer: MEDICARE

## 2022-11-25 ENCOUNTER — Encounter: Admit: 2022-11-25 | Discharge: 2022-11-25 | Payer: MEDICARE

## 2022-11-25 NOTE — Telephone Encounter
Received call from patient regarding surgery tomorrow.     She is scheduled for a Left fronto-temporal craniotomy, resection of middle fossa tumor on 11/26/2022.     Stated that she still has a lingering cough following COVID. Stated that this RN has spoken with Dr. Francis Gaines. Per Dr. Francis Gaines okay to proceed with surgery. Advised patient that there should not be a problem but there is a small chance that anesthesia will not proceed due to risk of respiratory complication. Answered additional preop questions as well. Patient verbalized understanding and voiced no additional concerns at this time.

## 2022-11-26 ENCOUNTER — Encounter: Admit: 2022-11-26 | Discharge: 2022-11-26 | Payer: MEDICARE

## 2022-11-26 ENCOUNTER — Inpatient Hospital Stay: Admit: 2022-11-26 | Discharge: 2022-11-26 | Payer: MEDICARE

## 2022-11-26 ENCOUNTER — Ambulatory Visit: Admit: 2022-11-26 | Discharge: 2022-11-26 | Payer: MEDICARE

## 2022-11-26 DIAGNOSIS — I10 Essential (primary) hypertension: Secondary | ICD-10-CM

## 2022-11-26 DIAGNOSIS — C801 Malignant (primary) neoplasm, unspecified: Secondary | ICD-10-CM

## 2022-11-26 DIAGNOSIS — H269 Unspecified cataract: Secondary | ICD-10-CM

## 2022-11-26 DIAGNOSIS — M545 Low back pain: Secondary | ICD-10-CM

## 2022-11-26 DIAGNOSIS — G4733 Obstructive sleep apnea (adult) (pediatric): Secondary | ICD-10-CM

## 2022-11-26 DIAGNOSIS — D539 Nutritional anemia, unspecified: Secondary | ICD-10-CM

## 2022-11-26 DIAGNOSIS — F99 Mental disorder, not otherwise specified: Secondary | ICD-10-CM

## 2022-11-26 DIAGNOSIS — G5602 Carpal tunnel syndrome, left upper limb: Secondary | ICD-10-CM

## 2022-11-26 DIAGNOSIS — Z86010 Personal history of colonic polyps: Secondary | ICD-10-CM

## 2022-11-26 DIAGNOSIS — C3490 Malignant neoplasm of unspecified part of unspecified bronchus or lung: Secondary | ICD-10-CM

## 2022-11-26 DIAGNOSIS — K319 Disease of stomach and duodenum, unspecified: Secondary | ICD-10-CM

## 2022-11-26 DIAGNOSIS — M25569 Pain in unspecified knee: Secondary | ICD-10-CM

## 2022-11-26 DIAGNOSIS — M810 Age-related osteoporosis without current pathological fracture: Secondary | ICD-10-CM

## 2022-11-26 DIAGNOSIS — E039 Hypothyroidism, unspecified: Secondary | ICD-10-CM

## 2022-11-26 DIAGNOSIS — E669 Obesity, unspecified: Secondary | ICD-10-CM

## 2022-11-26 DIAGNOSIS — G5601 Carpal tunnel syndrome, right upper limb: Secondary | ICD-10-CM

## 2022-11-26 DIAGNOSIS — U071 COVID-19: Secondary | ICD-10-CM

## 2022-11-26 DIAGNOSIS — E785 Hyperlipidemia, unspecified: Secondary | ICD-10-CM

## 2022-11-26 DIAGNOSIS — K5792 Diverticulitis of intestine, part unspecified, without perforation or abscess without bleeding: Secondary | ICD-10-CM

## 2022-11-26 DIAGNOSIS — F32A Depression: Secondary | ICD-10-CM

## 2022-11-26 DIAGNOSIS — E119 Type 2 diabetes mellitus without complications: Secondary | ICD-10-CM

## 2022-11-26 DIAGNOSIS — M549 Dorsalgia, unspecified: Secondary | ICD-10-CM

## 2022-11-26 DIAGNOSIS — E079 Disorder of thyroid, unspecified: Secondary | ICD-10-CM

## 2022-11-26 DIAGNOSIS — M5136 Other intervertebral disc degeneration, lumbar region: Secondary | ICD-10-CM

## 2022-11-26 DIAGNOSIS — M1990 Unspecified osteoarthritis, unspecified site: Secondary | ICD-10-CM

## 2022-11-26 DIAGNOSIS — G5603 Carpal tunnel syndrome, bilateral upper limbs: Secondary | ICD-10-CM

## 2022-11-26 DIAGNOSIS — M5134 Other intervertebral disc degeneration, thoracic region: Secondary | ICD-10-CM

## 2022-11-26 DIAGNOSIS — T7840XA Allergy, unspecified, initial encounter: Secondary | ICD-10-CM

## 2022-11-26 DIAGNOSIS — J189 Pneumonia, unspecified organism: Secondary | ICD-10-CM

## 2022-11-26 DIAGNOSIS — Z1371 Encounter for nonprocreative screening for genetic disease carrier status: Secondary | ICD-10-CM

## 2022-11-26 DIAGNOSIS — G4730 Sleep apnea, unspecified: Secondary | ICD-10-CM

## 2022-11-26 DIAGNOSIS — H547 Unspecified visual loss: Secondary | ICD-10-CM

## 2022-11-26 DIAGNOSIS — K929 Disease of digestive system, unspecified: Secondary | ICD-10-CM

## 2022-11-26 DIAGNOSIS — K219 Gastro-esophageal reflux disease without esophagitis: Secondary | ICD-10-CM

## 2022-11-26 MED ADMIN — FENTANYL CITRATE (PF) 50 MCG/ML IJ SOLN [3037]: 50 ug | INTRAVENOUS | @ 21:00:00 | NDC 00641602701

## 2022-11-26 MED ADMIN — FENTANYL CITRATE (PF) 50 MCG/ML IJ SOLN [3037]: 25 ug | INTRAVENOUS | @ 17:00:00 | Stop: 2022-11-27 | NDC 00641602701

## 2022-11-26 MED ADMIN — FENTANYL CITRATE (PF) 50 MCG/ML IJ SOLN [3037]: 50 ug | INTRAVENOUS | @ 20:00:00 | NDC 00641602701

## 2022-11-26 MED ADMIN — REMIFENTANIL 1 MG IV SOLR [77243]: .05 ug/kg/min | INTRAVENOUS | @ 13:00:00 | Stop: 2022-11-26 | NDC 67457019800

## 2022-11-26 MED ADMIN — DEXMEDETOMIDINE IN 0.9 % NACL 400 MCG/100 ML (4 MCG/ML) IV SOLN [317250]: 0.3 ug/kg/h | INTRAVENOUS | @ 18:00:00 | Stop: 2022-11-26 | NDC 71225013301

## 2022-11-26 MED ADMIN — PHENYLEPHRINE HCL 10 MG/ML IJ SOLN [6242]: .2 ug/kg/min | INTRAVENOUS | @ 15:00:00 | Stop: 2022-11-26 | NDC 70121157701

## 2022-11-26 MED ADMIN — FENTANYL CITRATE (PF) 50 MCG/ML IJ SOLN [3037]: 50 ug | INTRAVENOUS | @ 19:00:00 | NDC 00641602701

## 2022-11-26 MED ADMIN — DEXMEDETOMIDINE IN 0.9 % NACL 400 MCG/100 ML (4 MCG/ML) IV SOLN [317250]: 0.9 ug/kg/h | INTRAVENOUS | @ 22:00:00 | NDC 70121138901

## 2022-11-26 MED ADMIN — SODIUM CHLORIDE 0.9 % IR SOLN [11403]: 1000 mL | @ 15:00:00 | Stop: 2022-11-26 | NDC 00338004804

## 2022-11-26 MED ADMIN — CEFAZOLIN 1 GRAM IJ SOLR [1445]: 1000 mL | @ 15:00:00 | Stop: 2022-11-26 | NDC 00143992490

## 2022-11-26 MED ADMIN — SODIUM CHLORIDE 0.9 % IV SOLP [27838]: .05 ug/kg/min | INTRAVENOUS | @ 13:00:00 | Stop: 2022-11-26 | NDC 00338004904

## 2022-11-26 MED ADMIN — DEXAMETHASONE SODIUM PHOSPHATE 4 MG/ML IJ SOLN [2332]: 4 mg | INTRAVENOUS | @ 20:00:00 | NDC 67457042300

## 2022-11-26 MED ADMIN — SODIUM CHLORIDE 0.9 % IV SOLP [27838]: 1000 mL | INTRAVENOUS | @ 12:00:00 | Stop: 2022-11-26 | NDC 00338004904

## 2022-11-26 MED ADMIN — LABETALOL 5 MG/ML IV SOLN [10372]: 10 mg | INTRAVENOUS | @ 18:00:00 | NDC 36000032001

## 2022-11-26 MED ADMIN — HYDRALAZINE 20 MG/ML IJ SOLN [3697]: 10 mg | INTRAVENOUS | @ 17:00:00 | NDC 63323061400

## 2022-11-26 MED ADMIN — LABETALOL 5 MG/ML IV SOLN [10372]: 10 mg | INTRAVENOUS | @ 17:00:00 | NDC 36000032001

## 2022-11-26 MED ADMIN — SODIUM CHLORIDE 0.9 % IV SOLP [27838]: .2 ug/kg/min | INTRAVENOUS | @ 15:00:00 | Stop: 2022-11-26 | NDC 00338004902

## 2022-11-26 MED ADMIN — PROPOFOL 10 MG/ML IV EMUL [11150]: 120 ug/kg/min | INTRAVENOUS | @ 13:00:00 | Stop: 2022-11-26 | NDC 63323026965

## 2022-11-26 MED ADMIN — FENTANYL CITRATE (PF) 50 MCG/ML IJ SOLN [3037]: 25 ug | INTRAVENOUS | @ 17:00:00 | NDC 00641602701

## 2022-11-26 MED ADMIN — CEFAZOLIN 2 GRAM IV SOLR [462609]: 2 g | INTRAVENOUS | @ 22:00:00 | Stop: 2022-11-27 | NDC 00143913901

## 2022-11-26 MED ADMIN — INSULIN ASPART 100 UNIT/ML SC FLEXPEN [87504]: 1 [IU] | SUBCUTANEOUS | @ 18:00:00 | NDC 00169633910

## 2022-11-27 ENCOUNTER — Inpatient Hospital Stay: Admit: 2022-11-27 | Discharge: 2022-11-27 | Payer: MEDICARE

## 2022-11-27 MED ADMIN — FENTANYL CITRATE (PF) 50 MCG/ML IJ SOLN [3037]: 100 ug | INTRAVENOUS | @ 04:00:00 | Stop: 2022-11-27 | NDC 00641602701

## 2022-11-27 MED ADMIN — MAGNESIUM HYDROXIDE 400 MG/5 ML PO SUSP [79944]: 30 mL | ORAL | @ 15:00:00 | Stop: 2022-11-27 | NDC 00121043130

## 2022-11-27 MED ADMIN — GADOBENATE DIMEGLUMINE 529 MG/ML (0.1MMOL/0.2ML) IV SOLN [135881]: 20 mL | INTRAVENOUS | @ 03:00:00 | Stop: 2022-11-27 | NDC 00270516415

## 2022-11-27 MED ADMIN — FENTANYL CITRATE (PF) 50 MCG/ML IJ SOLN [3037]: 50 ug | INTRAVENOUS | @ 17:00:00 | NDC 00409909412

## 2022-11-27 MED ADMIN — FENTANYL CITRATE (PF) 50 MCG/ML IJ SOLN [3037]: 25 ug | INTRAVENOUS | @ 01:00:00 | Stop: 2022-11-27 | NDC 00641602701

## 2022-11-27 MED ADMIN — LABETALOL 5 MG/ML IV SOLN [10372]: 10 mg | INTRAVENOUS | @ 23:00:00 | NDC 36000032001

## 2022-11-27 MED ADMIN — FENTANYL CITRATE (PF) 50 MCG/ML IJ SOLN [3037]: 50 ug | INTRAVENOUS | @ 15:00:00 | NDC 00409909412

## 2022-11-27 MED ADMIN — LORAZEPAM 2 MG/ML IJ SOLN GROUP [280020]: 1 mg | INTRAVENOUS | @ 14:00:00 | Stop: 2022-11-27 | NDC 00641604401

## 2022-11-27 MED ADMIN — DOCUSATE SODIUM 100 MG PO CAP [2566]: 100 mg | ORAL | @ 15:00:00 | Stop: 2022-11-27 | NDC 00904718361

## 2022-11-27 MED ADMIN — VALPROATE SODIUM 500 MG/5 ML (100 MG/ML) IV SOLN [20887]: 250 mg | INTRAVENOUS | @ 23:00:00 | NDC 63323049401

## 2022-11-27 MED ADMIN — CARVEDILOL 3.125 MG PO TAB [79317]: 3.125 mg | ORAL | @ 15:00:00 | Stop: 2022-11-27 | NDC 68382009205

## 2022-11-27 MED ADMIN — INSULIN ASPART 100 UNIT/ML SC FLEXPEN [87504]: 4 [IU] | SUBCUTANEOUS | @ 16:00:00 | NDC 00169633910

## 2022-11-27 MED ADMIN — VALPROATE SODIUM 500 MG/5 ML (100 MG/ML) IV SOLN [20887]: 250 mg | INTRAVENOUS | @ 16:00:00 | NDC 63323049401

## 2022-11-27 MED ADMIN — LEVETIRACETAM 500 MG/5 ML IV SOLN [129831]: 500 mg | INTRAVENOUS | @ 04:00:00 | Stop: 2022-11-27 | NDC 72205012001

## 2022-11-27 MED ADMIN — PSYLLIUM HUSK (ASPARTAME) 3 GRAM PO PWPK [454372]: 1 | ORAL | @ 15:00:00 | NDC 96295013533

## 2022-11-27 MED ADMIN — PANTOPRAZOLE 40 MG PO TBEC [80436]: 40 mg | ORAL | @ 15:00:00 | Stop: 2022-11-27 | NDC 00904647461

## 2022-11-27 MED ADMIN — SODIUM CHLORIDE 0.9 % IV SOLP [27838]: 250 mg | INTRAVENOUS | @ 16:00:00 | NDC 00338004918

## 2022-11-27 MED ADMIN — DEXMEDETOMIDINE IN 0.9 % NACL 400 MCG/100 ML (4 MCG/ML) IV SOLN [317250]: 0.5 ug/kg/h | INTRAVENOUS | @ 09:00:00 | Stop: 2022-11-27 | NDC 70121138901

## 2022-11-27 MED ADMIN — FENTANYL CITRATE (PF) 50 MCG/ML IJ SOLN [3037]: 50 ug | INTRAVENOUS | @ 21:00:00 | NDC 00409909412

## 2022-11-27 MED ADMIN — GABAPENTIN 100 MG PO CAP [18309]: 200 mg | ORAL | @ 15:00:00 | Stop: 2022-11-27 | NDC 00904666561

## 2022-11-27 MED ADMIN — CETIRIZINE 10 MG PO TAB [77730]: 10 mg | ORAL | @ 15:00:00 | Stop: 2022-11-27 | NDC 00904671761

## 2022-11-27 MED ADMIN — SENNOSIDES-DOCUSATE SODIUM 8.6-50 MG PO TAB [40926]: 1 | ORAL | @ 15:00:00 | Stop: 2022-11-27 | NDC 00536124810

## 2022-11-27 MED ADMIN — LORAZEPAM 2 MG/ML IJ SOLN GROUP [280020]: 1 mg | INTRAVENOUS | @ 15:00:00 | Stop: 2022-11-27 | NDC 00641604401

## 2022-11-27 MED ADMIN — FENTANYL CITRATE (PF) 50 MCG/ML IJ SOLN [3037]: 50 ug | INTRAVENOUS | @ 14:00:00 | NDC 00409909412

## 2022-11-27 MED ADMIN — FENTANYL CITRATE (PF) 50 MCG/ML IJ SOLN [3037]: 50 ug | INTRAVENOUS | @ 23:00:00 | NDC 00409909412

## 2022-11-27 MED ADMIN — DEXAMETHASONE SODIUM PHOSPHATE 4 MG/ML IJ SOLN [2332]: 4 mg | INTRAVENOUS | @ 18:00:00 | NDC 67457042300

## 2022-11-27 MED ADMIN — HYDRALAZINE 20 MG/ML IJ SOLN [3697]: 10 mg | INTRAVENOUS | @ 01:00:00 | NDC 63323061400

## 2022-11-27 MED ADMIN — CEFAZOLIN 2 GRAM IV SOLR [462609]: 2 g | INTRAVENOUS | @ 06:00:00 | Stop: 2022-11-27 | NDC 00143913901

## 2022-11-27 MED ADMIN — MAGNESIUM SULFATE IN D5W 1 GRAM/100 ML IV PGBK [166578]: 1 g | INTRAVENOUS | @ 09:00:00 | Stop: 2023-11-26 | NDC 00338170940

## 2022-11-27 MED ADMIN — DEXMEDETOMIDINE IN 0.9 % NACL 400 MCG/100 ML (4 MCG/ML) IV SOLN [317250]: 0.5 ug/kg/h | INTRAVENOUS | @ 15:00:00 | Stop: 2022-11-27 | NDC 70121138901

## 2022-11-27 MED ADMIN — ACETAMINOPHEN 1,000 MG/100 ML (10 MG/ML) IV SOLN [305632]: 1000 mg | INTRAVENOUS | @ 14:00:00 | Stop: 2022-11-27 | NDC 00264410090

## 2022-11-27 MED ADMIN — CARVEDILOL 3.125 MG PO TAB [79317]: 3.125 mg | ORAL | @ 21:00:00 | Stop: 2022-11-27 | NDC 68382009205

## 2022-11-27 MED ADMIN — DEXMEDETOMIDINE IN 0.9 % NACL 400 MCG/100 ML (4 MCG/ML) IV SOLN [317250]: 0.7 ug/kg/h | INTRAVENOUS | @ 02:00:00 | NDC 70121138901

## 2022-11-27 MED ADMIN — METFORMIN 500 MG PO TAB [10544]: 1000 mg | ORAL | @ 21:00:00 | NDC 70010006301

## 2022-11-27 MED ADMIN — LORAZEPAM 2 MG/ML IJ SOLN GROUP [280020]: 1 mg | INTRAVENOUS | @ 02:00:00 | Stop: 2022-11-27 | NDC 00409198503

## 2022-11-27 MED ADMIN — LOSARTAN 50 MG PO TAB [76938]: 50 mg | ORAL | @ 15:00:00 | Stop: 2022-11-27 | NDC 68084034711

## 2022-11-27 MED ADMIN — POLYETHYLENE GLYCOL 3350 17 GRAM PO PWPK [25424]: 17 g | ORAL | @ 15:00:00 | Stop: 2022-11-27 | NDC 00904693186

## 2022-11-27 MED ADMIN — METFORMIN 500 MG PO TAB [10544]: 1000 mg | ORAL | @ 15:00:00 | NDC 70010006301

## 2022-11-27 MED ADMIN — DEXAMETHASONE SODIUM PHOSPHATE 4 MG/ML IJ SOLN [2332]: 4 mg | INTRAVENOUS | @ 06:00:00 | NDC 67457042300

## 2022-11-27 MED ADMIN — DEXAMETHASONE SODIUM PHOSPHATE 4 MG/ML IJ SOLN [2332]: 4 mg | INTRAVENOUS | @ 01:00:00 | NDC 67457042300

## 2022-11-27 MED ADMIN — DEXAMETHASONE SODIUM PHOSPHATE 4 MG/ML IJ SOLN [2332]: 4 mg | INTRAVENOUS | @ 14:00:00 | NDC 67457042300

## 2022-11-27 MED ADMIN — SODIUM CHLORIDE 0.9 % IV SOLP [27838]: 250 mg | INTRAVENOUS | @ 23:00:00 | NDC 00338004918

## 2022-11-28 ENCOUNTER — Encounter: Admit: 2022-11-28 | Discharge: 2022-11-28 | Payer: MEDICARE

## 2022-11-28 DIAGNOSIS — M5134 Other intervertebral disc degeneration, thoracic region: Secondary | ICD-10-CM

## 2022-11-28 DIAGNOSIS — G5601 Carpal tunnel syndrome, right upper limb: Secondary | ICD-10-CM

## 2022-11-28 DIAGNOSIS — H547 Unspecified visual loss: Secondary | ICD-10-CM

## 2022-11-28 DIAGNOSIS — T7840XA Allergy, unspecified, initial encounter: Secondary | ICD-10-CM

## 2022-11-28 DIAGNOSIS — H269 Unspecified cataract: Secondary | ICD-10-CM

## 2022-11-28 DIAGNOSIS — M5136 Other intervertebral disc degeneration, lumbar region: Secondary | ICD-10-CM

## 2022-11-28 DIAGNOSIS — F32A Depression: Secondary | ICD-10-CM

## 2022-11-28 DIAGNOSIS — M549 Dorsalgia, unspecified: Secondary | ICD-10-CM

## 2022-11-28 DIAGNOSIS — Z1371 Encounter for nonprocreative screening for genetic disease carrier status: Secondary | ICD-10-CM

## 2022-11-28 DIAGNOSIS — E079 Disorder of thyroid, unspecified: Secondary | ICD-10-CM

## 2022-11-28 DIAGNOSIS — J189 Pneumonia, unspecified organism: Secondary | ICD-10-CM

## 2022-11-28 DIAGNOSIS — G4730 Sleep apnea, unspecified: Secondary | ICD-10-CM

## 2022-11-28 DIAGNOSIS — M25569 Pain in unspecified knee: Secondary | ICD-10-CM

## 2022-11-28 DIAGNOSIS — M810 Age-related osteoporosis without current pathological fracture: Secondary | ICD-10-CM

## 2022-11-28 DIAGNOSIS — G4733 Obstructive sleep apnea (adult) (pediatric): Secondary | ICD-10-CM

## 2022-11-28 DIAGNOSIS — D539 Nutritional anemia, unspecified: Secondary | ICD-10-CM

## 2022-11-28 DIAGNOSIS — K929 Disease of digestive system, unspecified: Secondary | ICD-10-CM

## 2022-11-28 DIAGNOSIS — E119 Type 2 diabetes mellitus without complications: Secondary | ICD-10-CM

## 2022-11-28 DIAGNOSIS — I10 Essential (primary) hypertension: Secondary | ICD-10-CM

## 2022-11-28 DIAGNOSIS — E785 Hyperlipidemia, unspecified: Secondary | ICD-10-CM

## 2022-11-28 DIAGNOSIS — G5603 Carpal tunnel syndrome, bilateral upper limbs: Secondary | ICD-10-CM

## 2022-11-28 DIAGNOSIS — E669 Obesity, unspecified: Secondary | ICD-10-CM

## 2022-11-28 DIAGNOSIS — M1990 Unspecified osteoarthritis, unspecified site: Secondary | ICD-10-CM

## 2022-11-28 DIAGNOSIS — F99 Mental disorder, not otherwise specified: Secondary | ICD-10-CM

## 2022-11-28 DIAGNOSIS — G5602 Carpal tunnel syndrome, left upper limb: Secondary | ICD-10-CM

## 2022-11-28 DIAGNOSIS — E039 Hypothyroidism, unspecified: Secondary | ICD-10-CM

## 2022-11-28 DIAGNOSIS — U071 COVID-19: Secondary | ICD-10-CM

## 2022-11-28 DIAGNOSIS — M545 Low back pain: Secondary | ICD-10-CM

## 2022-11-28 DIAGNOSIS — K319 Disease of stomach and duodenum, unspecified: Secondary | ICD-10-CM

## 2022-11-28 DIAGNOSIS — Z86010 Personal history of colonic polyps: Secondary | ICD-10-CM

## 2022-11-28 DIAGNOSIS — K219 Gastro-esophageal reflux disease without esophagitis: Secondary | ICD-10-CM

## 2022-11-28 DIAGNOSIS — C3490 Malignant neoplasm of unspecified part of unspecified bronchus or lung: Secondary | ICD-10-CM

## 2022-11-28 DIAGNOSIS — K5792 Diverticulitis of intestine, part unspecified, without perforation or abscess without bleeding: Secondary | ICD-10-CM

## 2022-11-28 DIAGNOSIS — C801 Malignant (primary) neoplasm, unspecified: Secondary | ICD-10-CM

## 2022-11-28 MED ADMIN — BISACODYL 10 MG RE SUPP [1080]: 10 mg | RECTAL | @ 12:00:00 | NDC 00574705012

## 2022-11-28 MED ADMIN — LANSOPRAZOLE 30 MG PO TBLD [88164]: 30 mg | NASOGASTRIC | @ 11:00:00 | NDC 65862089610

## 2022-11-28 MED ADMIN — DEXAMETHASONE 4 MG PO TAB [2327]: 4 mg | NASOGASTRIC | @ 17:00:00 | NDC 00054817525

## 2022-11-28 MED ADMIN — TRAMADOL 50 MG PO TAB [14632]: 50 mg | NASOGASTRIC | @ 23:00:00 | NDC 00904717961

## 2022-11-28 MED ADMIN — LOSARTAN 50 MG PO TAB [76938]: 50 mg | NASOGASTRIC | @ 01:00:00 | NDC 68084034711

## 2022-11-28 MED ADMIN — SODIUM CHLORIDE 0.9 % IV SOLP [27838]: 250 mg | INTRAVENOUS | @ 17:00:00 | NDC 00338004918

## 2022-11-28 MED ADMIN — FENTANYL CITRATE (PF) 50 MCG/ML IJ SOLN [3037]: 50 ug | INTRAVENOUS | @ 01:00:00 | NDC 00409909412

## 2022-11-28 MED ADMIN — VALPROATE SODIUM 500 MG/5 ML (100 MG/ML) IV SOLN [20887]: 250 mg | INTRAVENOUS | @ 23:00:00 | NDC 00143978501

## 2022-11-28 MED ADMIN — FENTANYL CITRATE (PF) 50 MCG/ML IJ SOLN [3037]: 50 ug | INTRAVENOUS | @ 11:00:00 | Stop: 2022-11-28 | NDC 00409909412

## 2022-11-28 MED ADMIN — TRAMADOL 50 MG PO TAB [14632]: 50 mg | NASOGASTRIC | @ 22:00:00 | NDC 00904717961

## 2022-11-28 MED ADMIN — ACETAMINOPHEN 325 MG PO TAB [101]: 650 mg | NASOGASTRIC | @ 08:00:00 | Stop: 2022-11-28 | NDC 00904677361

## 2022-11-28 MED ADMIN — LEVOTHYROXINE 88 MCG PO TAB [10403]: 88 ug | NASOGASTRIC | @ 11:00:00 | NDC 42292003801

## 2022-11-28 MED ADMIN — POLYETHYLENE GLYCOL 3350 17 GRAM PO PWPK [25424]: 17 g | NASOGASTRIC | @ 14:00:00 | NDC 00904693186

## 2022-11-28 MED ADMIN — MELATONIN 5 MG PO TAB [168576]: 5 mg | NASOGASTRIC | @ 01:00:00 | NDC 20555003901

## 2022-11-28 MED ADMIN — SENNOSIDES-DOCUSATE SODIUM 8.6-50 MG PO TAB [40926]: 1 | NASOGASTRIC | @ 14:00:00 | NDC 00536124810

## 2022-11-28 MED ADMIN — FENTANYL CITRATE (PF) 50 MCG/ML IJ SOLN [3037]: 50 ug | INTRAVENOUS | @ 07:00:00 | Stop: 2022-11-28 | NDC 00409909412

## 2022-11-28 MED ADMIN — DEXAMETHASONE 4 MG PO TAB [2327]: 4 mg | NASOGASTRIC | @ 23:00:00 | NDC 00054817525

## 2022-11-28 MED ADMIN — ATORVASTATIN 20 MG PO TAB [77412]: 10 mg | NASOGASTRIC | @ 01:00:00 | NDC 00904629161

## 2022-11-28 MED ADMIN — POTASSIUM PHOSPHATE, MONOBASIC 500 MG PO TBSO [85136]: 4 | NASOGASTRIC | @ 15:00:00 | Stop: 2022-11-28 | NDC 39328000810

## 2022-11-28 MED ADMIN — VALPROATE SODIUM 500 MG/5 ML (100 MG/ML) IV SOLN [20887]: 250 mg | INTRAVENOUS | @ 11:00:00 | NDC 63323049401

## 2022-11-28 MED ADMIN — SODIUM CHLORIDE 0.9 % IV SOLP [27838]: 250 mg | INTRAVENOUS | @ 23:00:00 | NDC 00338004918

## 2022-11-28 MED ADMIN — VALPROATE SODIUM 500 MG/5 ML (100 MG/ML) IV SOLN [20887]: 250 mg | INTRAVENOUS | @ 17:00:00 | NDC 63323049401

## 2022-11-28 MED ADMIN — CARVEDILOL 3.125 MG PO TAB [79317]: 3.125 mg | NASOGASTRIC | @ 22:00:00 | NDC 68382009205

## 2022-11-28 MED ADMIN — TRAMADOL 50 MG PO TAB [14632]: 100 mg | NASOGASTRIC | @ 01:00:00 | NDC 00904717961

## 2022-11-28 MED ADMIN — ACETAMINOPHEN 325 MG PO TAB [101]: 650 mg | NASOGASTRIC | @ 03:00:00 | NDC 00904677361

## 2022-11-28 MED ADMIN — DEXAMETHASONE SODIUM PHOSPHATE 4 MG/ML IJ SOLN [2332]: 4 mg | INTRAVENOUS | @ 07:00:00 | Stop: 2022-11-28 | NDC 67457042300

## 2022-11-28 MED ADMIN — DEXAMETHASONE SODIUM PHOSPHATE 4 MG/ML IJ SOLN [2332]: 4 mg | INTRAVENOUS | @ 01:00:00 | NDC 67457042300

## 2022-11-28 MED ADMIN — FENTANYL CITRATE (PF) 50 MCG/ML IJ SOLN [3037]: 50 ug | INTRAVENOUS | @ 09:00:00 | Stop: 2022-11-28 | NDC 00409909412

## 2022-11-28 MED ADMIN — ACETAMINOPHEN 325 MG PO TAB [101]: 650 mg | NASOGASTRIC | @ 22:00:00 | Stop: 2022-11-28 | NDC 00904677361

## 2022-11-28 MED ADMIN — CARVEDILOL 3.125 MG PO TAB [79317]: 3.125 mg | NASOGASTRIC | @ 14:00:00 | NDC 68382009205

## 2022-11-28 MED ADMIN — SODIUM CHLORIDE 0.9 % IV SOLP [27838]: 250 mg | INTRAVENOUS | @ 05:00:00 | NDC 00338004918

## 2022-11-28 MED ADMIN — MAGNESIUM HYDROXIDE 400 MG/5 ML PO SUSP [79944]: 30 mL | NASOGASTRIC | @ 14:00:00 | NDC 00121043130

## 2022-11-28 MED ADMIN — POTASSIUM PHOSPHATE, MONOBASIC 500 MG PO TBSO [85136]: 4 | ORAL | @ 08:00:00 | Stop: 2022-11-28 | NDC 39328000810

## 2022-11-28 MED ADMIN — GABAPENTIN 100 MG PO CAP [18309]: 200 mg | NASOGASTRIC | @ 01:00:00 | NDC 00904666561

## 2022-11-28 MED ADMIN — GABAPENTIN 100 MG PO CAP [18309]: 200 mg | NASOGASTRIC | @ 14:00:00 | NDC 00904666561

## 2022-11-28 MED ADMIN — CETIRIZINE 10 MG PO TAB [77730]: 10 mg | NASOGASTRIC | @ 14:00:00 | NDC 00904671761

## 2022-11-28 MED ADMIN — VALPROATE SODIUM 500 MG/5 ML (100 MG/ML) IV SOLN [20887]: 250 mg | INTRAVENOUS | @ 05:00:00 | NDC 63323049401

## 2022-11-28 MED ADMIN — LOSARTAN 50 MG PO TAB [76938]: 50 mg | NASOGASTRIC | @ 14:00:00 | NDC 68084034711

## 2022-11-28 MED ADMIN — DEXAMETHASONE SODIUM PHOSPHATE 4 MG/ML IJ SOLN [2332]: 4 mg | INTRAVENOUS | @ 14:00:00 | Stop: 2022-11-28 | NDC 67457042300

## 2022-11-28 MED ADMIN — FENTANYL CITRATE (PF) 50 MCG/ML IJ SOLN [3037]: 50 ug | INTRAVENOUS | @ 06:00:00 | Stop: 2022-11-28 | NDC 00409909412

## 2022-11-28 MED ADMIN — SODIUM CHLORIDE 0.9 % IV SOLP [27838]: 250 mg | INTRAVENOUS | @ 11:00:00 | NDC 00338004918

## 2022-11-28 MED ADMIN — FENTANYL CITRATE (PF) 50 MCG/ML IJ SOLN [3037]: 50 ug | INTRAVENOUS | @ 03:00:00 | NDC 00409909412

## 2022-11-29 ENCOUNTER — Inpatient Hospital Stay: Admit: 2022-11-29 | Discharge: 2022-11-29 | Payer: MEDICARE

## 2022-11-29 MED ADMIN — FENTANYL CITRATE (PF) 50 MCG/ML IJ SOLN [3037]: 25 ug | INTRAVENOUS | @ 16:00:00 | Stop: 2022-11-29 | NDC 00641602701

## 2022-11-29 MED ADMIN — OXYCODONE 5 MG PO TAB [10814]: 5 mg | ORAL | @ 23:00:00 | NDC 00406055223

## 2022-11-29 MED ADMIN — HEPARIN, PORCINE (PF) 5,000 UNIT/0.5 ML IJ SYRG [95535]: 5000 [IU] | SUBCUTANEOUS | @ 11:00:00 | NDC 00409131611

## 2022-11-29 MED ADMIN — ACETAMINOPHEN 160 MG/5 ML PO SOLN [100]: 650 mg | NASOGASTRIC | @ 14:00:00 | Stop: 2022-11-29 | NDC 00121197121

## 2022-11-29 MED ADMIN — SODIUM CHLORIDE 0.9 % IV SOLP [27838]: 250 mg | INTRAVENOUS | @ 13:00:00 | Stop: 2022-11-29 | NDC 00338004918

## 2022-11-29 MED ADMIN — ATORVASTATIN 10 MG PO TAB [77736]: 10 mg | NASOGASTRIC | @ 03:00:00 | NDC 00904629061

## 2022-11-29 MED ADMIN — ACETAMINOPHEN 160 MG/5 ML PO SOLN [100]: 650 mg | ORAL | @ 23:00:00 | NDC 81033000210

## 2022-11-29 MED ADMIN — TRAMADOL 50 MG PO TAB [14632]: 50 mg | NASOGASTRIC | @ 03:00:00 | NDC 00904717961

## 2022-11-29 MED ADMIN — DEXAMETHASONE 4 MG PO TAB [2327]: 4 mg | ORAL | @ 23:00:00 | NDC 00904726661

## 2022-11-29 MED ADMIN — SODIUM CHLORIDE 0.9 % IV SOLP [27838]: 250 mg | INTRAVENOUS | @ 19:00:00 | Stop: 2022-11-29 | NDC 00338004918

## 2022-11-29 MED ADMIN — VALPROATE SODIUM 500 MG/5 ML (100 MG/ML) IV SOLN [20887]: 250 mg | INTRAVENOUS | @ 13:00:00 | Stop: 2022-11-29 | NDC 00143978501

## 2022-11-29 MED ADMIN — OXYCODONE 5 MG PO TAB [10814]: 5 mg | ORAL | @ 14:00:00 | Stop: 2022-11-29 | NDC 00406055223

## 2022-11-29 MED ADMIN — DEXAMETHASONE 4 MG PO TAB [2327]: 4 mg | NASOGASTRIC | @ 05:00:00 | Stop: 2022-11-29 | NDC 00054817525

## 2022-11-29 MED ADMIN — GABAPENTIN 100 MG PO CAP [18309]: 200 mg | NASOGASTRIC | @ 03:00:00 | NDC 00904666561

## 2022-11-29 MED ADMIN — CARVEDILOL 3.125 MG PO TAB [79317]: 3.125 mg | ORAL | @ 23:00:00 | NDC 00904630061

## 2022-11-29 MED ADMIN — LOSARTAN 50 MG PO TAB [76938]: 50 mg | NASOGASTRIC | @ 03:00:00 | NDC 68084034711

## 2022-11-29 MED ADMIN — SENNOSIDES-DOCUSATE SODIUM 8.6-50 MG PO TAB [40926]: 1 | NASOGASTRIC | @ 14:00:00 | Stop: 2022-11-29 | NDC 00536124810

## 2022-11-29 MED ADMIN — SODIUM CHLORIDE 0.9 % IV SOLP [27838]: 250 mg | INTRAVENOUS | @ 05:00:00 | Stop: 2022-11-29 | NDC 00338004918

## 2022-11-29 MED ADMIN — METFORMIN 500 MG PO TAB [10544]: 1000 mg | ORAL | @ 23:00:00 | NDC 70010006301

## 2022-11-29 MED ADMIN — HEPARIN, PORCINE (PF) 5,000 UNIT/0.5 ML IJ SYRG [95535]: 5000 [IU] | SUBCUTANEOUS | @ 19:00:00 | NDC 00409131611

## 2022-11-29 MED ADMIN — LANSOPRAZOLE 30 MG PO TBLD [88164]: 30 mg | NASOGASTRIC | @ 16:00:00 | NDC 68382077230

## 2022-11-29 MED ADMIN — DEXAMETHASONE 4 MG PO TAB [2327]: 4 mg | NASOGASTRIC | @ 11:00:00 | Stop: 2022-11-29 | NDC 00054817525

## 2022-11-29 MED ADMIN — VALPROATE SODIUM 500 MG/5 ML (100 MG/ML) IV SOLN [20887]: 250 mg | INTRAVENOUS | @ 19:00:00 | Stop: 2022-11-29 | NDC 00143978501

## 2022-11-29 MED ADMIN — TRAMADOL 50 MG PO TAB [14632]: 50 mg | NASOGASTRIC | @ 11:00:00 | Stop: 2022-11-29 | NDC 00904717961

## 2022-11-29 MED ADMIN — HEPARIN, PORCINE (PF) 5,000 UNIT/0.5 ML IJ SYRG [95535]: 5000 [IU] | SUBCUTANEOUS | @ 03:00:00 | NDC 00409131611

## 2022-11-29 MED ADMIN — VALPROATE SODIUM 500 MG/5 ML (100 MG/ML) IV SOLN [20887]: 250 mg | INTRAVENOUS | @ 05:00:00 | Stop: 2022-11-29 | NDC 00143978501

## 2022-11-29 MED ADMIN — DEXTROSE 5% IN WATER IV SOLP [2364]: 24 mmol | INTRAVENOUS | @ 11:00:00 | Stop: 2022-11-29 | NDC 00338001703

## 2022-11-29 MED ADMIN — OXYCODONE 5 MG PO TAB [10814]: 5 mg | ORAL | @ 16:00:00 | Stop: 2022-11-29 | NDC 00406055223

## 2022-11-29 MED ADMIN — DEXAMETHASONE 4 MG PO TAB [2327]: 4 mg | ORAL | @ 18:00:00 | NDC 00904726661

## 2022-11-29 MED ADMIN — MELATONIN 5 MG PO TAB [168576]: 5 mg | NASOGASTRIC | @ 03:00:00 | NDC 20555003901

## 2022-11-29 MED ADMIN — CETIRIZINE 10 MG PO TAB [77730]: 10 mg | NASOGASTRIC | @ 14:00:00 | Stop: 2022-11-29 | NDC 00904671761

## 2022-11-29 MED ADMIN — CARVEDILOL 3.125 MG PO TAB [79317]: 3.125 mg | NASOGASTRIC | @ 14:00:00 | Stop: 2022-11-29 | NDC 00904630061

## 2022-11-29 MED ADMIN — TRAMADOL 50 MG PO TAB [14632]: 50 mg | NASOGASTRIC | @ 05:00:00 | Stop: 2022-11-29 | NDC 00904717961

## 2022-11-29 MED ADMIN — GABAPENTIN 100 MG PO CAP [18309]: 200 mg | NASOGASTRIC | @ 14:00:00 | Stop: 2022-11-29 | NDC 00904666561

## 2022-11-29 MED ADMIN — ACETAMINOPHEN 160 MG/5 ML PO SOLN [100]: 650 mg | NASOGASTRIC | @ 03:00:00 | NDC 00121197121

## 2022-11-29 MED ADMIN — METHOCARBAMOL 500 MG PO TAB [4971]: 500 mg | ORAL | @ 16:00:00 | NDC 00904705761

## 2022-11-29 MED ADMIN — LOSARTAN 50 MG PO TAB [76938]: 50 mg | NASOGASTRIC | @ 14:00:00 | Stop: 2022-11-29 | NDC 68084034711

## 2022-11-29 MED ADMIN — LEVOTHYROXINE 88 MCG PO TAB [10403]: 88 ug | NASOGASTRIC | @ 13:00:00 | Stop: 2022-11-29 | NDC 00904695261

## 2022-11-29 MED ADMIN — POTASSIUM PHOSPHATE M-/D-BASIC 3 MMOL/ML IV SOLN [6451]: 24 mmol | INTRAVENOUS | @ 11:00:00 | Stop: 2022-11-29 | NDC 65219005409

## 2022-11-30 MED ADMIN — CARVEDILOL 3.125 MG PO TAB [79317]: 3.125 mg | ORAL | @ 22:00:00 | NDC 00904630061

## 2022-11-30 MED ADMIN — LEVOTHYROXINE 88 MCG PO TAB [10403]: 88 ug | ORAL | @ 13:00:00 | NDC 00904695261

## 2022-11-30 MED ADMIN — METFORMIN 500 MG PO TAB [10544]: 1000 mg | ORAL | @ 22:00:00 | NDC 70010006301

## 2022-11-30 MED ADMIN — OXYCODONE 5 MG PO TAB [10814]: 10 mg | ORAL | @ 13:00:00 | NDC 00406055223

## 2022-11-30 MED ADMIN — ACETAMINOPHEN 160 MG/5 ML PO SOLN [100]: 650 mg | ORAL | @ 13:00:00 | NDC 81033000210

## 2022-11-30 MED ADMIN — LOSARTAN 50 MG PO TAB [76938]: 50 mg | ORAL | @ 03:00:00 | NDC 68084034711

## 2022-11-30 MED ADMIN — HEPARIN, PORCINE (PF) 5,000 UNIT/0.5 ML IJ SYRG [95535]: 5000 [IU] | SUBCUTANEOUS | @ 12:00:00 | NDC 00409131611

## 2022-11-30 MED ADMIN — CARVEDILOL 3.125 MG PO TAB [79317]: 3.125 mg | ORAL | @ 13:00:00 | NDC 00904630061

## 2022-11-30 MED ADMIN — METHOCARBAMOL 500 MG PO TAB [4971]: 500 mg | ORAL | @ 13:00:00 | NDC 00904705761

## 2022-11-30 MED ADMIN — ATORVASTATIN 10 MG PO TAB [77736]: 10 mg | ORAL | @ 03:00:00 | NDC 00904629061

## 2022-11-30 MED ADMIN — MELATONIN 5 MG PO TAB [168576]: 5 mg | ORAL | @ 03:00:00 | NDC 20555003901

## 2022-11-30 MED ADMIN — OXYCODONE 5 MG PO TAB [10814]: 5 mg | ORAL | @ 09:00:00 | NDC 00406055223

## 2022-11-30 MED ADMIN — OXYCODONE 5 MG PO TAB [10814]: 10 mg | ORAL | @ 17:00:00 | NDC 00406055223

## 2022-11-30 MED ADMIN — GABAPENTIN 100 MG PO CAP [18309]: 200 mg | ORAL | @ 03:00:00 | NDC 00904666561

## 2022-11-30 MED ADMIN — DEXAMETHASONE 4 MG PO TAB [2327]: 4 mg | ORAL | @ 12:00:00 | NDC 00904726661

## 2022-11-30 MED ADMIN — CETIRIZINE 10 MG PO TAB [77730]: 10 mg | ORAL | @ 13:00:00 | NDC 00904671761

## 2022-11-30 MED ADMIN — LOSARTAN 50 MG PO TAB [76938]: 50 mg | ORAL | @ 13:00:00 | NDC 68084034711

## 2022-11-30 MED ADMIN — GABAPENTIN 100 MG PO CAP [18309]: 200 mg | ORAL | @ 13:00:00 | NDC 00904666561

## 2022-11-30 MED ADMIN — POLYETHYLENE GLYCOL 3350 17 GRAM PO PWPK [25424]: 17 g | ORAL | @ 13:00:00 | NDC 00904693186

## 2022-11-30 MED ADMIN — MAGNESIUM HYDROXIDE 400 MG/5 ML PO SUSP [79944]: 30 mL | ORAL | @ 13:00:00 | NDC 00121043130

## 2022-11-30 MED ADMIN — METHOCARBAMOL 500 MG PO TAB [4971]: 500 mg | ORAL | @ 03:00:00 | NDC 00904705761

## 2022-11-30 MED ADMIN — SENNOSIDES-DOCUSATE SODIUM 8.6-50 MG PO TAB [40926]: 1 | ORAL | @ 03:00:00 | NDC 00536124810

## 2022-11-30 MED ADMIN — DEXAMETHASONE 4 MG PO TAB [2327]: 4 mg | ORAL | @ 17:00:00 | NDC 00904726661

## 2022-11-30 MED ADMIN — SENNOSIDES-DOCUSATE SODIUM 8.6-50 MG PO TAB [40926]: 1 | ORAL | @ 13:00:00 | NDC 00536124810

## 2022-11-30 MED ADMIN — OXYCODONE 5 MG PO TAB [10814]: 10 mg | ORAL | @ 22:00:00 | NDC 00406055223

## 2022-11-30 MED ADMIN — DEXAMETHASONE 4 MG PO TAB [2327]: 4 mg | ORAL | @ 06:00:00 | NDC 00904726661

## 2022-11-30 MED ADMIN — METFORMIN 500 MG PO TAB [10544]: 1000 mg | ORAL | @ 13:00:00 | NDC 70010006301

## 2022-11-30 MED ADMIN — HEPARIN, PORCINE (PF) 5,000 UNIT/0.5 ML IJ SYRG [95535]: 5000 [IU] | SUBCUTANEOUS | @ 19:00:00 | NDC 00409131611

## 2022-11-30 MED ADMIN — DEXAMETHASONE 4 MG PO TAB [2327]: 4 mg | ORAL | @ 23:00:00 | NDC 00904726661

## 2022-11-30 MED ADMIN — DIVALPROEX 500 MG PO TBEC [79087]: 500 mg | ORAL | @ 14:00:00 | NDC 68084078211

## 2022-11-30 MED ADMIN — LANSOPRAZOLE 30 MG PO TBLD [88164]: 30 mg | NASOGASTRIC | @ 13:00:00 | NDC 68382077230

## 2022-11-30 MED ADMIN — DIVALPROEX 500 MG PO TBEC [79087]: 500 mg | ORAL | @ 03:00:00 | NDC 68084078211

## 2022-11-30 MED ADMIN — ACETAMINOPHEN 160 MG/5 ML PO SOLN [100]: 650 mg | ORAL | @ 21:00:00 | NDC 81033000210

## 2022-11-30 MED ADMIN — HEPARIN, PORCINE (PF) 5,000 UNIT/0.5 ML IJ SYRG [95535]: 5000 [IU] | SUBCUTANEOUS | @ 03:00:00 | NDC 00409131611

## 2022-12-01 MED ADMIN — HEPARIN, PORCINE (PF) 5,000 UNIT/0.5 ML IJ SYRG [95535]: 5000 [IU] | SUBCUTANEOUS | @ 19:00:00 | NDC 00409131611

## 2022-12-01 MED ADMIN — LOSARTAN 50 MG PO TAB [76938]: 50 mg | ORAL | @ 15:00:00 | NDC 68084034711

## 2022-12-01 MED ADMIN — ACETAMINOPHEN 160 MG/5 ML PO SOLN [100]: 650 mg | ORAL | @ 19:00:00 | NDC 81033000210

## 2022-12-01 MED ADMIN — OXYCODONE 5 MG PO TAB [10814]: 10 mg | ORAL | @ 03:00:00 | NDC 00406055223

## 2022-12-01 MED ADMIN — POLYETHYLENE GLYCOL 3350 17 GRAM PO PWPK [25424]: 17 g | ORAL | @ 15:00:00 | NDC 00904693186

## 2022-12-01 MED ADMIN — DIVALPROEX 500 MG PO TBEC [79087]: 500 mg | ORAL | @ 03:00:00 | NDC 68084078211

## 2022-12-01 MED ADMIN — BISACODYL 10 MG RE SUPP [1080]: 10 mg | RECTAL | @ 17:00:00 | NDC 00574705012

## 2022-12-01 MED ADMIN — ACETAMINOPHEN 160 MG/5 ML PO SOLN [100]: 650 mg | ORAL | @ 13:00:00 | NDC 81033000210

## 2022-12-01 MED ADMIN — DIVALPROEX 500 MG PO TBEC [79087]: 500 mg | ORAL | @ 15:00:00 | NDC 68084078211

## 2022-12-01 MED ADMIN — MAGNESIUM HYDROXIDE 400 MG/5 ML PO SUSP [79944]: 30 mL | ORAL | @ 15:00:00 | NDC 00121043130

## 2022-12-01 MED ADMIN — OXYCODONE 5 MG PO TAB [10814]: 10 mg | ORAL | @ 23:00:00 | NDC 00904696661

## 2022-12-01 MED ADMIN — GABAPENTIN 100 MG PO CAP [18309]: 200 mg | ORAL | @ 15:00:00 | NDC 00904666561

## 2022-12-01 MED ADMIN — CARVEDILOL 3.125 MG PO TAB [79317]: 3.125 mg | ORAL | @ 13:00:00 | NDC 00904630061

## 2022-12-01 MED ADMIN — GABAPENTIN 100 MG PO CAP [18309]: 200 mg | ORAL | @ 03:00:00 | NDC 00904666561

## 2022-12-01 MED ADMIN — HEPARIN, PORCINE (PF) 5,000 UNIT/0.5 ML IJ SYRG [95535]: 5000 [IU] | SUBCUTANEOUS | @ 03:00:00 | NDC 00409131611

## 2022-12-01 MED ADMIN — DEXAMETHASONE 4 MG PO TAB [2327]: 4 mg | ORAL | @ 05:00:00 | NDC 00904726661

## 2022-12-01 MED ADMIN — LEVOTHYROXINE 88 MCG PO TAB [10403]: 88 ug | ORAL | @ 13:00:00 | NDC 00904695261

## 2022-12-01 MED ADMIN — CARVEDILOL 3.125 MG PO TAB [79317]: 3.125 mg | ORAL | @ 22:00:00 | NDC 00904630061

## 2022-12-01 MED ADMIN — LANSOPRAZOLE 30 MG PO TBLD [88164]: 30 mg | NASOGASTRIC | @ 13:00:00 | NDC 68382077230

## 2022-12-01 MED ADMIN — HYDROCORTISONE 1 % TP OINT [3731]: TOPICAL | @ 03:00:00 | NDC 00168002016

## 2022-12-01 MED ADMIN — SENNOSIDES-DOCUSATE SODIUM 8.6-50 MG PO TAB [40926]: 1 | ORAL | @ 15:00:00 | NDC 00536124810

## 2022-12-01 MED ADMIN — OXYCODONE 5 MG PO TAB [10814]: 10 mg | ORAL | @ 11:00:00 | Stop: 2022-12-01 | NDC 00406055223

## 2022-12-01 MED ADMIN — LOSARTAN 50 MG PO TAB [76938]: 50 mg | ORAL | @ 03:00:00 | NDC 68084034711

## 2022-12-01 MED ADMIN — METFORMIN 500 MG PO TAB [10544]: 1000 mg | ORAL | @ 22:00:00 | NDC 70010006301

## 2022-12-01 MED ADMIN — SENNOSIDES-DOCUSATE SODIUM 8.6-50 MG PO TAB [40926]: 1 | ORAL | @ 03:00:00 | NDC 00536124810

## 2022-12-01 MED ADMIN — METHOCARBAMOL 500 MG PO TAB [4971]: 500 mg | ORAL | @ 15:00:00 | NDC 00904705761

## 2022-12-01 MED ADMIN — DEXAMETHASONE 4 MG PO TAB [2327]: 4 mg | ORAL | @ 17:00:00 | NDC 00904726661

## 2022-12-01 MED ADMIN — METHOCARBAMOL 500 MG PO TAB [4971]: 500 mg | ORAL | @ 03:00:00 | NDC 00904705761

## 2022-12-01 MED ADMIN — METFORMIN 500 MG PO TAB [10544]: 1000 mg | ORAL | @ 13:00:00 | NDC 70010006301

## 2022-12-01 MED ADMIN — HEPARIN, PORCINE (PF) 5,000 UNIT/0.5 ML IJ SYRG [95535]: 5000 [IU] | SUBCUTANEOUS | @ 11:00:00 | NDC 00409131611

## 2022-12-01 MED ADMIN — MELATONIN 5 MG PO TAB [168576]: 5 mg | ORAL | @ 03:00:00 | NDC 20555003901

## 2022-12-01 MED ADMIN — CETIRIZINE 10 MG PO TAB [77730]: 10 mg | ORAL | @ 13:00:00 | NDC 00904671761

## 2022-12-01 MED ADMIN — OXYCODONE 5 MG PO TAB [10814]: 10 mg | ORAL | @ 19:00:00 | Stop: 2022-12-01 | NDC 00904696661

## 2022-12-01 MED ADMIN — ATORVASTATIN 10 MG PO TAB [77736]: 10 mg | ORAL | @ 03:00:00 | NDC 00904629061

## 2022-12-01 MED ADMIN — DEXAMETHASONE 4 MG PO TAB [2327]: 4 mg | ORAL | @ 22:00:00 | NDC 00904726661

## 2022-12-01 MED ADMIN — DEXAMETHASONE 4 MG PO TAB [2327]: 4 mg | ORAL | @ 11:00:00 | NDC 00904726661

## 2022-12-02 MED ADMIN — SENNOSIDES-DOCUSATE SODIUM 8.6-50 MG PO TAB [40926]: 1 | ORAL | @ 15:00:00 | NDC 00536124810

## 2022-12-02 MED ADMIN — GABAPENTIN 100 MG PO CAP [18309]: 200 mg | ORAL | @ 15:00:00 | NDC 00904666561

## 2022-12-02 MED ADMIN — ATORVASTATIN 10 MG PO TAB [77736]: 10 mg | ORAL | @ 02:00:00 | NDC 00904629061

## 2022-12-02 MED ADMIN — OXYCODONE 5 MG PO TAB [10814]: 10 mg | ORAL | @ 05:00:00 | NDC 00904696661

## 2022-12-02 MED ADMIN — HEPARIN, PORCINE (PF) 5,000 UNIT/0.5 ML IJ SYRG [95535]: 5000 [IU] | SUBCUTANEOUS | @ 02:00:00 | NDC 00409131611

## 2022-12-02 MED ADMIN — OXYCODONE 5 MG PO TAB [10814]: 10 mg | ORAL | @ 02:00:00 | NDC 00904696661

## 2022-12-02 MED ADMIN — METHOCARBAMOL 750 MG PO TAB [4972]: 750 mg | ORAL | @ 15:00:00 | NDC 70010077005

## 2022-12-02 MED ADMIN — METFORMIN 500 MG PO TAB [10544]: 1000 mg | ORAL | @ 15:00:00 | NDC 60687015511

## 2022-12-02 MED ADMIN — LEVOTHYROXINE 88 MCG PO TAB [10403]: 88 ug | ORAL | @ 12:00:00 | NDC 00904695261

## 2022-12-02 MED ADMIN — DIVALPROEX 500 MG PO TBEC [79087]: 500 mg | ORAL | @ 02:00:00 | NDC 68084078211

## 2022-12-02 MED ADMIN — METFORMIN 500 MG PO TAB [10544]: 1000 mg | ORAL | @ 22:00:00 | NDC 70010006301

## 2022-12-02 MED ADMIN — DEXAMETHASONE 4 MG PO TAB [2327]: 4 mg | ORAL | @ 05:00:00 | Stop: 2022-12-02 | NDC 00904726661

## 2022-12-02 MED ADMIN — DEXAMETHASONE 4 MG PO TAB [2327]: 4 mg | ORAL | @ 19:00:00 | NDC 00904726661

## 2022-12-02 MED ADMIN — LOSARTAN 50 MG PO TAB [76938]: 50 mg | ORAL | @ 02:00:00 | NDC 68084034711

## 2022-12-02 MED ADMIN — LANSOPRAZOLE 30 MG PO TBLD [88164]: 30 mg | NASOGASTRIC | @ 12:00:00 | NDC 65862089610

## 2022-12-02 MED ADMIN — HEPARIN, PORCINE (PF) 5,000 UNIT/0.5 ML IJ SYRG [95535]: 5000 [IU] | SUBCUTANEOUS | @ 19:00:00 | NDC 00409131611

## 2022-12-02 MED ADMIN — MELATONIN 5 MG PO TAB [168576]: 5 mg | ORAL | @ 02:00:00 | NDC 20555003901

## 2022-12-02 MED ADMIN — METHOCARBAMOL 500 MG PO TAB [4971]: 500 mg | ORAL | @ 02:00:00 | NDC 00904705761

## 2022-12-02 MED ADMIN — CARVEDILOL 3.125 MG PO TAB [79317]: 3.125 mg | ORAL | @ 22:00:00 | NDC 00904630061

## 2022-12-02 MED ADMIN — GABAPENTIN 100 MG PO CAP [18309]: 200 mg | ORAL | @ 02:00:00 | NDC 00904666561

## 2022-12-02 MED ADMIN — OXYCODONE 5 MG PO TAB [10814]: 10 mg | ORAL | @ 15:00:00 | NDC 00904696661

## 2022-12-02 MED ADMIN — DIVALPROEX 500 MG PO TBEC [79087]: 500 mg | ORAL | @ 15:00:00 | NDC 68084078211

## 2022-12-02 MED ADMIN — DEXAMETHASONE 4 MG PO TAB [2327]: 4 mg | ORAL | @ 12:00:00 | Stop: 2022-12-02 | NDC 00904726661

## 2022-12-02 MED ADMIN — CETIRIZINE 10 MG PO TAB [77730]: 10 mg | ORAL | @ 15:00:00 | NDC 00904671761

## 2022-12-02 MED ADMIN — HEPARIN, PORCINE (PF) 5,000 UNIT/0.5 ML IJ SYRG [95535]: 5000 [IU] | SUBCUTANEOUS | @ 12:00:00 | NDC 00409131611

## 2022-12-02 MED ADMIN — LOSARTAN 50 MG PO TAB [76938]: 50 mg | ORAL | @ 15:00:00 | NDC 68084034711

## 2022-12-02 MED ADMIN — HYDROXYZINE HCL 25 MG PO TAB [3774]: 25 mg | ORAL | @ 22:00:00 | NDC 00904661761

## 2022-12-02 MED ADMIN — OXYCODONE 5 MG PO TAB [10814]: 5 mg | ORAL | @ 22:00:00 | NDC 00904696661

## 2022-12-02 MED ADMIN — CARVEDILOL 3.125 MG PO TAB [79317]: 3.125 mg | ORAL | @ 15:00:00 | NDC 68382009205

## 2022-12-03 MED ADMIN — DEXAMETHASONE 4 MG PO TAB [2327]: 4 mg | ORAL | @ 11:00:00 | Stop: 2022-12-03 | NDC 00904726661

## 2022-12-03 MED ADMIN — SENNOSIDES-DOCUSATE SODIUM 8.6-50 MG PO TAB [40926]: 1 | ORAL | @ 01:00:00 | NDC 00536124810

## 2022-12-03 MED ADMIN — METHOCARBAMOL 750 MG PO TAB [4972]: 750 mg | ORAL | @ 01:00:00 | NDC 70010077005

## 2022-12-03 MED ADMIN — HEPARIN, PORCINE (PF) 5,000 UNIT/0.5 ML IJ SYRG [95535]: 5000 [IU] | SUBCUTANEOUS | @ 03:00:00 | NDC 00409131611

## 2022-12-03 MED ADMIN — MAGNESIUM HYDROXIDE 400 MG/5 ML PO SUSP [79944]: 30 mL | ORAL | @ 15:00:00 | Stop: 2022-12-03 | NDC 00121043130

## 2022-12-03 MED ADMIN — OXYCODONE 5 MG PO TAB [10814]: 10 mg | ORAL | @ 12:00:00 | Stop: 2022-12-03 | NDC 00406055223

## 2022-12-03 MED ADMIN — METFORMIN 500 MG PO TAB [10544]: 1000 mg | ORAL | @ 15:00:00 | Stop: 2022-12-03 | NDC 70010006301

## 2022-12-03 MED ADMIN — MECLIZINE 25 MG PO TAB [12025]: 25 mg | ORAL | @ 15:00:00 | Stop: 2022-12-03 | NDC 60687073011

## 2022-12-03 MED ADMIN — ATORVASTATIN 10 MG PO TAB [77736]: 10 mg | ORAL | @ 01:00:00 | NDC 00904629061

## 2022-12-03 MED ADMIN — DEXAMETHASONE 4 MG PO TAB [2327]: 4 mg | ORAL | @ 19:00:00 | Stop: 2022-12-03 | NDC 00904726661

## 2022-12-03 MED ADMIN — OXYCODONE 5 MG PO TAB [10814]: 10 mg | ORAL | @ 15:00:00 | Stop: 2022-12-03 | NDC 00406055223

## 2022-12-03 MED ADMIN — LOSARTAN 50 MG PO TAB [76938]: 50 mg | ORAL | @ 01:00:00 | NDC 68084034711

## 2022-12-03 MED ADMIN — GABAPENTIN 100 MG PO CAP [18309]: 200 mg | ORAL | @ 01:00:00 | NDC 00904666561

## 2022-12-03 MED ADMIN — DIVALPROEX 500 MG PO TBEC [79087]: 500 mg | ORAL | @ 01:00:00 | NDC 68084078211

## 2022-12-03 MED ADMIN — GABAPENTIN 100 MG PO CAP [18309]: 200 mg | ORAL | @ 15:00:00 | Stop: 2022-12-03 | NDC 00904666561

## 2022-12-03 MED ADMIN — LANSOPRAZOLE 30 MG PO TBLD [88164]: 30 mg | NASOGASTRIC | @ 11:00:00 | Stop: 2022-12-03 | NDC 65862089610

## 2022-12-03 MED ADMIN — CARVEDILOL 3.125 MG PO TAB [79317]: 3.125 mg | ORAL | @ 15:00:00 | Stop: 2022-12-03 | NDC 00904630061

## 2022-12-03 MED ADMIN — POLYETHYLENE GLYCOL 3350 17 GRAM PO PWPK [25424]: 17 g | ORAL | @ 15:00:00 | Stop: 2022-12-03 | NDC 00904693186

## 2022-12-03 MED ADMIN — CETIRIZINE 10 MG PO TAB [77730]: 10 mg | ORAL | @ 15:00:00 | Stop: 2022-12-03 | NDC 00904671761

## 2022-12-03 MED ADMIN — DEXAMETHASONE 4 MG PO TAB [2327]: 4 mg | ORAL | @ 03:00:00 | NDC 00904726661

## 2022-12-03 MED ADMIN — DIVALPROEX 500 MG PO TBEC [79087]: 500 mg | ORAL | @ 15:00:00 | Stop: 2022-12-03 | NDC 68084078211

## 2022-12-03 MED ADMIN — ACETAMINOPHEN 160 MG/5 ML PO SOLN [100]: 650 mg | ORAL | @ 19:00:00 | Stop: 2022-12-03 | NDC 81033000210

## 2022-12-03 MED ADMIN — HEPARIN, PORCINE (PF) 5,000 UNIT/0.5 ML IJ SYRG [95535]: 5000 [IU] | SUBCUTANEOUS | @ 11:00:00 | Stop: 2022-12-03 | NDC 00409131611

## 2022-12-03 MED ADMIN — LEVOTHYROXINE 88 MCG PO TAB [10403]: 88 ug | ORAL | @ 11:00:00 | Stop: 2022-12-03 | NDC 00904695261

## 2022-12-03 MED ADMIN — SENNOSIDES-DOCUSATE SODIUM 8.6-50 MG PO TAB [40926]: 1 | ORAL | @ 15:00:00 | Stop: 2022-12-03 | NDC 00536124810

## 2022-12-03 MED ADMIN — LOSARTAN 50 MG PO TAB [76938]: 50 mg | ORAL | @ 15:00:00 | Stop: 2022-12-03 | NDC 68084034711

## 2022-12-03 MED ADMIN — MELATONIN 5 MG PO TAB [168576]: 5 mg | ORAL | @ 01:00:00 | NDC 20555003901

## 2022-12-03 MED ADMIN — METHOCARBAMOL 750 MG PO TAB [4972]: 750 mg | ORAL | @ 15:00:00 | Stop: 2022-12-03 | NDC 70010077005

## 2022-12-04 ENCOUNTER — Encounter: Admit: 2022-12-04 | Discharge: 2022-12-04 | Payer: MEDICARE

## 2022-12-04 NOTE — Telephone Encounter
Hospital Discharge Follow Up      Reached Patient: No, transferred to [facility] in [place]  Patient Date of Birth: 1950/11/08     Admission Information:     Hospital Name: [facility]  Admission Date: 11/26/22    Discharge Date: 12/03/22    Admission Diagnosis:  Brain tumor  Discharge Diagnosis:  Brain tumor  Has there been a discharge within the last 30 days? No   Hospital Services: Planned  Today's call is 1 (business) days post discharge      Medication Reconciliation    Changes to pre-hospital medications? Yes    START taking:  dexAMETHasone (DECADRON)  divalproex (DEPAKOTE)  heparin (porcine) PF  hydrOXYzine HCL (ATARAX)  meclizine (ANTIVERT)  methocarbamoL (ROBAXIN)  oxyCODONE (ROXICODONE)  CHANGE how you take:  metFORMIN (GLUCOPHAGE)  torsemide (DEMADEX)    Were new prescriptions filled? N/A  Meds reviewed and reconciled? Yes    Current Outpatient Medications   Medication Instructions    acetaminophen (TYLENOL) 650 mg, Oral, EVERY  6 HOURS PRN, For fever or pain.    ascorbic acid (vitamin C) (C-500) 500 mg, Oral, DAILY    atorvastatin (LIPITOR) 10 mg tablet TAKE 1 TABLET BY MOUTH EVERY DAY    AZO D-MANNOSE 2,000 mg, Oral, DAILY    calcium polycarbophil (FIBERCON) 625 mg, Oral, TWICE DAILY    carvediloL (COREG) 3.125 mg, Oral, TWICE DAILY WITH MEALS, Take with food.    cetirizine (ZYRTEC) 10 mg, Oral, EVERY MORNING    CHOLEcalciferoL (vitamin D3) 1,000 Units, Oral, DAILY    cranberry conc-ascorbic acid 4,200-20 mg cap 2 capsules, Oral, EVERY MORNING    dexAMETHasone (DECADRON) 4 mg tablet Take one tablet by mouth every 8 hours for 2 days, THEN one tablet every 12 hours for 2 days, THEN one-half tablet every 12 hours for 2 days. Take with food.  Indications: cerebral edema    divalproex (DEPAKOTE) 500 mg, Oral, TWICE DAILY, Take with food.    gabapentin (NEURONTIN) 200 mg, Oral, TWICE DAILY    guaiFENesin LA (MUCINEX) 600 mg, Oral, EVERY MORNING    heparin (porcine) PF 5,000 Units, Subcutaneous, EVERY  8 HOURS, Dvt ppx    hydrOXYzine HCL (ATARAX) 25 mg, Oral, THREE TIMES DAILY PRN    levothyroxine (SYNTHROID) 88 mcg tablet TAKE 1 TABLET BY MOUTH EVERY DAY IN THE MORNING    losartan (COZAAR) 50 mg tablet TAKE 1 TABLET BY MOUTH TWICE A DAY    meclizine (ANTIVERT) 25 mg, Oral, THREE TIMES DAILY PRN    melatonin 10 mg, Oral, DAILY    meloxicam (MOBIC) 15 mg tablet TAKE 1 TABLET BY MOUTH EVERY DAY AS NEEDED FOR PAIN    metFORMIN (GLUCOPHAGE) 1,000 mg, Oral, TWICE DAILY WITH MEALS    methocarbamoL (ROBAXIN) 750 mg, Oral, TWICE DAILY    other medication Medication Name & Strength (List each active ingredient & strength): Emma  Dose (how much): 2  Route: by mouth Frequency (how often): daily    other medication Medication Name & Strength (List each active ingredient & strength): vitamin K2 & K7  Dose (how much): 1 Route: by mouth  Frequency (how often): daily    oxybutynin XL (DITROPAN XL) 10 mg, Oral, TWICE DAILY, Do not cut/ crush/ chew    oxyCODONE (ROXICODONE) 5-10 mg, Oral, EVERY  4 HOURS PRN    OZEMPIC 0.25 mg, Subcutaneous, EVERY  7 DAYS    pantoprazole DR (PROTONIX) 40 mg tablet TAKE 1 TABLET BY MOUTH  TWICE A DAY    polyethylene glycol 3350 (MIRALAX) 17 g, Oral, DAILY  PRN    potassium chloride (KLOR-CON SPRINKLE) 10 mEq capsule 10 mEq, Oral, DAILY  PRN    torsemide (DEMADEX) 20 mg, Oral, DAILY  PRN, May take 2nd daily dose if needed.    vit C,E-Zn-coppr-lutein-zeaxan (PRESERVISION AREDS-2) 250-200-40-1 mg-unit-mg-mg cap 1 capsule, Oral, TWICE DAILY    Zinc 50 mg tab 1 tablet, Oral, DAILY       Scheduling Follow-up Appointment   Upcoming appointments:   Future Appointments   Date Time Provider Department Center   12/10/2022  1:00 PM Dellia Nims [facility] NeuroSurg   12/26/2022 12:00 PM Francis Gaines Annie Main, MD [facility] NeuroSurg   12/31/2022 10:00 AM Hiram Gash, MD [email or web address]   01/03/2023  9:20 AM Malon Kindle, MD [facility] Ophthalmolog 01/23/2023 11:30 AM Rubye Oaks, Lindell Noe, APRN-NP [facility] IM     Does the patient require 7 day follow up appointment? No  Hospital Follow-Up scheduled with PCP? No,   No TOC called placed per protocol  When was patient?s last PCP visit: 05/29/2022   PCP primary location: [facility]  Specialist appointment scheduled? Yes, with Neurology 12/10/22  Is assistance with transportation needed? Unable to assess   MyChart message sent? Active in MyChart. No message sent.   Artera text sent? No    Nicholes Calamity, RN

## 2022-12-10 ENCOUNTER — Ambulatory Visit: Admit: 2022-12-10 | Discharge: 2022-12-10 | Payer: MEDICARE

## 2022-12-10 ENCOUNTER — Encounter: Admit: 2022-12-10 | Discharge: 2022-12-10 | Payer: MEDICARE

## 2022-12-10 DIAGNOSIS — G4733 Obstructive sleep apnea (adult) (pediatric): Secondary | ICD-10-CM

## 2022-12-10 DIAGNOSIS — H269 Unspecified cataract: Secondary | ICD-10-CM

## 2022-12-10 DIAGNOSIS — K929 Disease of digestive system, unspecified: Secondary | ICD-10-CM

## 2022-12-10 DIAGNOSIS — K319 Disease of stomach and duodenum, unspecified: Secondary | ICD-10-CM

## 2022-12-10 DIAGNOSIS — C3490 Malignant neoplasm of unspecified part of unspecified bronchus or lung: Secondary | ICD-10-CM

## 2022-12-10 DIAGNOSIS — E119 Type 2 diabetes mellitus without complications: Secondary | ICD-10-CM

## 2022-12-10 DIAGNOSIS — H547 Unspecified visual loss: Secondary | ICD-10-CM

## 2022-12-10 DIAGNOSIS — G5603 Carpal tunnel syndrome, bilateral upper limbs: Secondary | ICD-10-CM

## 2022-12-10 DIAGNOSIS — M51369 Degenerative disc disease, lumbar: Secondary | ICD-10-CM

## 2022-12-10 DIAGNOSIS — T7840XA Allergy, unspecified, initial encounter: Secondary | ICD-10-CM

## 2022-12-10 DIAGNOSIS — M810 Age-related osteoporosis without current pathological fracture: Secondary | ICD-10-CM

## 2022-12-10 DIAGNOSIS — F32A Depression: Secondary | ICD-10-CM

## 2022-12-10 DIAGNOSIS — G5602 Carpal tunnel syndrome, left upper limb: Secondary | ICD-10-CM

## 2022-12-10 DIAGNOSIS — E079 Disorder of thyroid, unspecified: Secondary | ICD-10-CM

## 2022-12-10 DIAGNOSIS — K5792 Diverticulitis of intestine, part unspecified, without perforation or abscess without bleeding: Secondary | ICD-10-CM

## 2022-12-10 DIAGNOSIS — K219 Gastro-esophageal reflux disease without esophagitis: Secondary | ICD-10-CM

## 2022-12-10 DIAGNOSIS — M545 Low back pain: Secondary | ICD-10-CM

## 2022-12-10 DIAGNOSIS — G5601 Carpal tunnel syndrome, right upper limb: Secondary | ICD-10-CM

## 2022-12-10 DIAGNOSIS — F99 Mental disorder, not otherwise specified: Secondary | ICD-10-CM

## 2022-12-10 DIAGNOSIS — M25569 Pain in unspecified knee: Secondary | ICD-10-CM

## 2022-12-10 DIAGNOSIS — E785 Hyperlipidemia, unspecified: Secondary | ICD-10-CM

## 2022-12-10 DIAGNOSIS — M5134 Other intervertebral disc degeneration, thoracic region: Secondary | ICD-10-CM

## 2022-12-10 DIAGNOSIS — U071 COVID-19: Secondary | ICD-10-CM

## 2022-12-10 DIAGNOSIS — J189 Pneumonia, unspecified organism: Secondary | ICD-10-CM

## 2022-12-10 DIAGNOSIS — Z860101 Hx of adenomatous colonic polyps: Secondary | ICD-10-CM

## 2022-12-10 DIAGNOSIS — C801 Malignant (primary) neoplasm, unspecified: Secondary | ICD-10-CM

## 2022-12-10 DIAGNOSIS — Z9889 Other specified postprocedural states: Secondary | ICD-10-CM

## 2022-12-10 DIAGNOSIS — I10 Essential (primary) hypertension: Secondary | ICD-10-CM

## 2022-12-10 DIAGNOSIS — E669 Obesity, unspecified: Secondary | ICD-10-CM

## 2022-12-10 DIAGNOSIS — Z1371 Encounter for nonprocreative screening for genetic disease carrier status: Secondary | ICD-10-CM

## 2022-12-10 DIAGNOSIS — M549 Dorsalgia, unspecified: Secondary | ICD-10-CM

## 2022-12-10 DIAGNOSIS — D539 Nutritional anemia, unspecified: Secondary | ICD-10-CM

## 2022-12-10 DIAGNOSIS — M1990 Unspecified osteoarthritis, unspecified site: Secondary | ICD-10-CM

## 2022-12-10 DIAGNOSIS — E039 Hypothyroidism, unspecified: Secondary | ICD-10-CM

## 2022-12-10 DIAGNOSIS — G4730 Sleep apnea, unspecified: Secondary | ICD-10-CM

## 2022-12-10 NOTE — Progress Notes
Subjective:       History of Present Illness  Alicia Fox is a 72 y.o. female with PMH of heart failure, lung cancer, breast cancer, and uterine cancer.    Patient was originally seen in clinic on 10/24/2022 for evaluation of left temporal enhancing extra-axial mass.  Patient was diagnosed with lung cancer 5 years ago, treated with resection without adjuvant chemo or radiation therapy.    She was referred to ophthalmology for visual symptoms and is being managed for macular degeneration.  She was noted to have a afferent pupillary defect prompting referral to Dr. Donalee Citrin and MRI brain was obtained.  There were no signs of definitive compressive optic neuropathy.  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  Her most up-to-date MRI brain demonstrated significant increase in size of the tumor and surrounding edema.  Surgical resection was recommended, risk and benefits of surgery was discussed.  The patient expressed understanding and elected to proceed.  On 11/26/2022 patient underwent left frontal temporal craniotomy for middle fossa tumor resection, with Dr. Francis Gaines.  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  She is currently residing at [facility] in [place].  Patient states she is doing well after surgery.  She endorses occasional headaches and left-sided jaw pain, they have been giving her Oxy and Tylenol which has been helping her discomfort.  Overall, she is eating and drinking well.  We discussed wound care instructions as well as activity/lifting restrictions.    Patient has been getting up with assistance, denies having any falls since surgery.   Any other questions or concerns at this time.                  Allergies as of 12/10/2022 - Reviewed 12/04/2022   Allergen Reaction Noted    Codeine HEADACHE and ITCHING 03/25/2012       Patient Active Problem List    Diagnosis Date Noted History of COVID-19 05/13/2019     Priority: Medium     Overview Note:     She developed COVID in 12/20 with week long hospitalization and need for oxygen/steroids but not sent home on oxygen.  She apparently had pericardial effusion also as complication but no recurrence since single pericardiocentesis in March 2021.       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    Type 2 diabetes mellitus without complication, without long-term current use of insulin (HCC) 12/26/2021    Stress incontinence of urine 12/20/2020    Multilevel degenerative disc disease 11/21/2020    Other insomnia 08/16/2020    Elevated hemoglobin A1c 06/01/2020    Bilateral primary osteoarthritis of knee 08/10/2019    Hooded upper eyelid 08/04/2019    Other headache syndrome 05/28/2019    COVID-19 vaccine administered 05/28/2019    Shortness of breath with exposure to severe acute respiratory syndrome coronavirus 2 (SARS-CoV-2) 04/05/2019    Morbid obesity (HCC) 02/18/2019    OSA (  obstructive sleep apnea) 02/17/2018     Overview Note:     Split night Pap titration dated 10/21/2017 at the [facility] sleep lab showed severe obstructive sleep apnea with AHI of 105.6 events per hour successful CPAP titration 12 cmH2O using full facemask elevated frequency of.  Limb movement with index of 30.8 events per hour and nocturnal hypoxemia with oxygen desaturation nadir of 77% oxygen saturation below 88% for about 99.1 minutes    Pt set up with CPAP @ 12cm H2O on 12/04/2017.  DME: [name]      Carcinoid tumor of left lung (HCC) 12/17/2016     Overview Note:     On 03/21/17 she underwent a Robotic assisted left lower lobectomy under the direction of Dr. Particia Nearing.  Pathology impression:  Lung: Typical carcinoid tumor.   Greatest dimension (centimeters): 3.0 cm. Additional dimensions (centimeters): 2.5 x 2.5 cm.  Pathologic Stage Classification (pTNM, AJCC 8th Edition): pT1c N0       Spondylosis of cervical region without myelopathy or radiculopathy 09/24/2016    Carpal tunnel syndrome, bilateral 08/06/2016    Abnormal CT of the head 08/06/2016    Cough 08/09/2014    [place] cardiac risk <10% in next 10 years 08/09/2014    Bilateral edema of lower extremity 04/28/2014    Diastolic heart failure (HCC) 04/28/2014    Hyperlipemia 01/18/2010    Hypertension 07/21/2008    Fen-phen history 07/21/2008        Past Medical History:   Diagnosis Date    Allergy     Codeine    Arthritis     lumbar, knees    Back pain 1965    BRCA1 negative     BRCA2 negative     Cancer (HCC) 1986    breast-  LT    Cancer of lung (HCC) 2018    Carpal tunnel syndrome of left wrist 10/09/2016    Added automatically from request for surgery [id number]      Carpal tunnel syndrome of right wrist 10/22/2016    Carpal tunnel syndrome on both sides 2018    L > R    Cataract 2020    COVID-19 02/05/2019    Degenerative disc disease, lumbar 2023    Degenerative disc disease, thoracic 2023    Depression     Seasonal and close family and friends deaths    Disorder of thyroid gland     hypothyroid    Diverticulitis     DM (diabetes mellitus) (HCC)     Gastrointestinal disorder     gerd at times-under control with meds    GERD (gastroesophageal reflux disease)     HTN (hypertension)     Hx of adenomatous colonic polyps     Hyperlipidemia     Hypothyroid     Knee pain     right    Low back pain     Obesity     OSA on CPAP     Osteoporosis 2021    Osteopenia    Pneumonia     bilateral bacterial    Pneumonia due to COVID-19 virus 02/15/2019    Psychiatric illness     depression    Sleep apnea 1999    Stomach disorder     Unspecified deficiency anemia 2023    Vision decreased     AMD & Cataracts       Surgical History:   Procedure Laterality Date    HX MASTECTOMY Bilateral 1986  1986-stomberg mastectomy    BREAST BIOPSY  07/1984    MASTECTOMY, PARTIAL Bilateral 09-Jan-1985    MASTECTOMY, MODIFIED RADICAL Bilateral 1987    1987-full modified    BREAST AUGMENTATION Bilateral 1987    BREAST SURGERY Bilateral 07/1985    implants    HX DILATION AND CURETTAGE  07/1994    HX HYSTERECTOMY  10/1994    complete    HX ABDOMINOPLASTY  10/1994    HIP SURGERY  02/1995    HX LIPOMA RESECTION  11/1999    outer upper left arm    ANUS SURGERY  01/2010    anal fissure    HX LIPOMA RESECTION  02/2010    inside upper right arm    LEFT DECOMPRESSION NERVE MEDIAN WITH CARPAL TUNNEL RELEASE Left 10/17/2016    Performed by Delice Lesch, MD at [facility] OR    BRONCHOSCOPY N/A 01/13/2017    Performed by Audie Box, MD at [facility] OR    BRONCHOSCOPY WITH TRANSBRONCHIAL BIOPSY N/A 01/13/2017    Performed by Audie Box, MD at [facility] OR    BRONCHOSCOPY WITH ENDOBRONCHIAL ULTRASOUND GUIDED TRANSTRACHEAL/ TRANSBRONCHIAL SAMPLING - 3 OR MORE MEDIASTINAL/ HILAR LYMPH NODE STATIONS/ STRUCTURE - RIGID N/A 01/13/2017    Performed by Audie Box, MD at [facility] OR    BRONCHOSCOPY WITH IMAGE - GUIDED NAVIGATION - FLEXIBLE N/A 01/13/2017    Performed by Audie Box, MD at [facility] OR    ROBOTIC LEFT LOWER LOBECTOMY Left 03/21/2017    Performed by Particia Nearing, MD at [facility] CVOR    Colonoscopy N/A 10/01/2019    Performed by Meyer Cory, MD at [facility] ENDO    ESOPHAGOGASTRODUODENOSCOPY WITH SPECIMEN COLLECTION BY BRUSHING/ WASHING N/A 10/01/2019    Performed by Meyer Cory, MD at [facility] ENDO    ESOPHAGOGASTRODUODENOSCOPY WITH SNARE REMOVAL TUMOR/ POLYP/ OTHER LESION - FLEXIBLE N/A 10/01/2019    Performed by Meyer Cory, MD at [facility] ENDO    ESOPHAGOGASTRODUODENOSCOPY WITH BIOPSY - FLEXIBLE N/A 10/01/2019    Performed by Meyer Cory, MD at [facility] ENDO    COLONOSCOPY WITH SNARE REMOVAL TUMOR/ POLYP/ OTHER LESION  10/01/2019    Performed by Meyer Cory, MD at [facility] ENDO    Colonoscopy N/A 10/02/2021    Performed by Meyer Cory, MD at [facility] OR    Left fronto-temporal craniotomy, resection of middle fossa tumor Left 11/26/2022    Performed by Colin Benton, MD at [facility] OR    Left fronto-temporal craniotomy, resection of middle fossa tumor Left 11/26/2022    Performed by Colin Benton, MD at [facility] OR    CARDIOVASCULAR STRESS TEST  09-Jan-2022    ECHOCARDIOGRAM PROCEDURE  01-10-23    HX LOBECTOMY  January 09, 2018    Lower left lung removed.  Cancer    HYSTERECTOMY      PERICARDIOCENTESIS  Jan 10, 2020    Heart Failure Due to Covid; pericardial effusion    SPINE SURGERY  01-10-1992 and 1996    cervical fusion       Family History   Problem Relation Name Age of Onset    Complete Hysterectomy Mother Thayer Headings 16        uterine prolaps    Hypertension Mother Thayer Headings         Covid-19 fatality Feb 2021    Atrial Fibrillation Mother Dianna Limbo [facility]     Hypertension Father Manuela Neptune         COPD, PARKINSON'S death 01-09-2017    COPD Father  Maisie Fus Scaturro     Heart Failure Father Manuela Neptune     Parkinson's  Father Manuela Neptune     Vision Loss Father Manuela Neptune         AMD    Hypertension Brother Tom         Pericardiocentesus    Kidney Cancer Brother Len Scaturro 60    Cancer-Prostate Brother Len Scaturro 60    Hypertension Brother Len Scaturro         A-fib. 2023    Cancer Brother Len Counsellor and kidney    Birth Defect Brother Theora Master         Mentally handicapped    Early Death Brother Theora Master         Pneumonia staph sepsis  at 71yrs    Mental Illness Brother Theora Master     Intellectual Disability Brother      Heart Attack Paternal Uncle      Cancer-Hematologic Maternal Grandfather          leukemia    Cancer-Breast Paternal Grandmother  33    Cancer-Breast Maternal Aunt  73              Review of Systems   All other systems reviewed and are negative.          Objective: acetaminophen (TYLENOL) 325 mg tablet Take two tablets by mouth every 6 hours as needed. For fever or pain.    ascorbic acid (VITAMIN C) 500 mg tablet Take one tablet by mouth daily.    atorvastatin (LIPITOR) 10 mg tablet TAKE 1 TABLET BY MOUTH EVERY DAY (Patient taking differently: Take one tablet by mouth at bedtime daily.)    carvediloL (COREG) 3.125 mg tablet TAKE ONE TABLET BY MOUTH TWICE DAILY WITH MEALS. TAKE WITH FOOD.    cetirizine (ZYRTEC) 10 mg tablet Take one tablet by mouth every morning.    cholecalciferol (VITAMIN D-3) 1,000 units tablet Take one tablet by mouth daily.    cranberry conc-ascorbic acid 4,200-20 mg cap Take 2 capsules by mouth every morning.    d-mannose (AZO D-MANNOSE) 500 mg cap Take 2,000 mg by mouth daily.    divalproex (DEPAKOTE) 500 mg tablet, delayed release Take one tablet by mouth twice daily for 8 days. Take with food.  Indications: post op sx    gabapentin (NEURONTIN) 100 mg capsule Take two capsules by mouth twice daily. Indications: neuropathic pain    guaiFENesin LA (MUCINEX) 600 mg tablet Take one tablet by mouth every morning.    heparin (porcine) PF 5,000units/0.6mL injection syringe Inject 0.5 mL under the skin every 8 hours. Dvt ppx  Indications: deep vein thrombosis prevention    hydrOXYzine HCL (ATARAX) 25 mg tablet Take one tablet by mouth three times daily as needed. Indications: itching    levothyroxine (SYNTHROID) 88 mcg tablet TAKE 1 TABLET BY MOUTH EVERY DAY IN THE MORNING    losartan (COZAAR) 50 mg tablet TAKE 1 TABLET BY MOUTH TWICE A DAY    meclizine (ANTIVERT) 25 mg tablet Take one tablet by mouth three times daily as needed for Dizziness. Indications: post op dizziness    melatonin 10 mg cap Take one capsule by mouth daily.    meloxicam (MOBIC) 15 mg tablet TAKE 1 TABLET BY MOUTH EVERY DAY AS NEEDED FOR PAIN (Patient taking differently: Take one tablet by mouth every morning.)    metFORMIN (GLUCOPHAGE) 500 mg tablet Take two tablets  by mouth twice daily with meals. Indications: type 2 diabetes mellitus    methocarbamoL (ROBAXIN) 750 mg tablet Take one tablet by mouth twice daily. Indications: left post op jaw pain    other medication Medication Name & Strength (List each active ingredient & strength): Emma  Dose (how much): 2  Route: by mouth Frequency (how often): daily    other medication Medication Name & Strength (List each active ingredient & strength): vitamin K2 & K7  Dose (how much): 1 Route: by mouth  Frequency (how often): daily    oxybutynin XL (DITROPAN XL) 10 mg tablet Take one tablet by mouth twice daily. Do not cut/ crush/ chew    oxyCODONE (ROXICODONE) 5 mg tablet Take one tablet to two tablets by mouth every 4 hours as needed for Pain. Indications: pain    pantoprazole DR (PROTONIX) 40 mg tablet TAKE 1 TABLET BY MOUTH TWICE A DAY    polycarbophil (FIBERCON) 625 mg tablet Take one tablet by mouth twice daily.    polyethylene glycol 3350 (MIRALAX) 17 g packet Take one packet by mouth daily as needed.    potassium chloride (KLOR-CON SPRINKLE) 10 mEq capsule Take one capsule by mouth daily as needed (with torsemide). Indications: prevention of low potassium in the blood    semaglutide (OZEMPIC) 0.25 mg or 0.5 mg (2 mg/3 mL) injection PEN Inject one-quarter mg under the skin every 7 days. Indications: type 2 diabetes mellitus    torsemide (DEMADEX) 20 mg tablet Take one tablet by mouth daily as needed (edema). May take 2nd daily dose if needed.  Indications: visible water retention    vit C,E-Zn-coppr-lutein-zeaxan (PRESERVISION AREDS-2) 250-200-40-1 mg-unit-mg-mg cap Take 1 capsule by mouth twice daily.    Zinc 50 mg tab Take one tablet by mouth daily.     There were no vitals filed for this visit.  There is no height or weight on file to calculate BMI.     CBC w diff    Lab Results   Component Value Date/Time    WBC 7.8 11/30/2022 03:22 AM    RBC 4.29 11/30/2022 03:22 AM    HGB 9.7 (L) 11/30/2022 03:22 AM    HCT 30.6 (L) 11/30/2022 03:22 AM    MCV 71.4 (L) 11/30/2022 03:22 AM    MCH 22.7 (L) 11/30/2022 03:22 AM    MCHC 31.8 (L) 11/30/2022 03:22 AM    RDW 16.0 (H) 11/30/2022 03:22 AM    PLTCT 235 11/30/2022 03:22 AM    MPV 8.5 11/30/2022 03:22 AM    Lab Results   Component Value Date/Time    NEUT 86 (H) 11/30/2022 03:22 AM    ANC 6.67 11/30/2022 03:22 AM    LYMA 9 (L) 11/30/2022 03:22 AM    ALC 0.73 (L) 11/30/2022 03:22 AM    MONA 5 11/30/2022 03:22 AM    AMC 0.38 11/30/2022 03:22 AM    EOSA 0 11/30/2022 03:22 AM    AEC 0.00 11/30/2022 03:22 AM    BASA 0 11/30/2022 03:22 AM    ABC 0.01 11/30/2022 03:22 AM        Comprehensive Metabolic Profile    Lab Results   Component Value Date/Time    NA 136 (L) 11/30/2022 03:22 AM    K 4.4 11/30/2022 03:22 AM    CL 100 11/30/2022 03:22 AM    CO2 25 11/30/2022 03:22 AM    GAP 11 11/30/2022 03:22 AM    BUN 22 11/30/2022 03:22 AM    CR 0.44  11/30/2022 03:22 AM    GLU 140 (H) 11/30/2022 03:22 AM    Lab Results   Component Value Date/Time    CA 9.7 11/30/2022 03:22 AM    PO4 2.1 12/01/2022 04:15 AM    ALBUMIN 4.3 11/26/2022 07:07 AM    TOTPROT 7.5 11/26/2022 07:07 AM    ALKPHOS 114 (H) 11/26/2022 07:07 AM    AST 16 11/26/2022 07:07 AM    ALT 15 11/26/2022 07:07 AM    TOTBILI 0.6 11/26/2022 07:07 AM    GFR >60 09/02/2019 11:20 AM    GFRAA >60 09/02/2019 11:20 AM            Physical Exam  Neurological:      Mental Status: She is alert and oriented to person, place, and time.   Psychiatric:         Mood and Affect: Mood normal.         Behavior: Behavior normal.         Thought Content: Thought content normal.         Judgment: Judgment normal.       Cranial incision:   Incision appears to be healing well.  There is no presence of erythema, drainage, or swelling noted.  Staples are intact       Assessment and Plan:       Patient presents today for follow up, status post left frontal temporal craniotomy for tumor resection with Dr. Francis Gaines.    Staples can be safely removed by the nursing facility.   Will discontinue the heparin. Incision is clean, dry, and intact. No presence of erythema, drainage, swelling noted  Call if temperature greater than 100.38F, incision red, drainage or odor noted from incision, pain that is uncontrolled with pain medication or any questions/concerns.  Avoid touching your incision as much as possible. No ointment, creams, or lotion on incisions.   Discussed ongoing wound care. Please continue with your current wound regimen    No driving for two weeks post surgery and while on any pain medications.   No straining while trying to have a bowel movement. Keep hydrated and use stool softeners as needed.   Avoid pulling, pushing or lifting greater than 10 pounds.    Please follow up with Dr. Francis Gaines on 12/26/2022 as previously scheduled.   All questions answered                   Total Time Today was 20 minutes in the following activities: Preparing to see the patient, Obtaining and/or reviewing separately obtained history, Performing a medically appropriate examination and/or evaluation, and Counseling and educating the patient/family/caregiver     Rayne Du, APRN-NP  Phone: [phone]  Pager: [phone]

## 2022-12-13 ENCOUNTER — Encounter: Admit: 2022-12-13 | Discharge: 2022-12-13 | Payer: MEDICARE

## 2022-12-13 NOTE — Telephone Encounter
VM left on Nurse line:    FW: Voice Message from Annamary Carolin Tel: [phone]- Amy @ [facility]- for Alicia Fox [birth year]- speak with nurse of medications. call [phone].    Returned call. Went thru [employer].  Will fax list and last clinical note. Requesting HH records as well.  Fax: [phone].

## 2022-12-16 ENCOUNTER — Encounter: Admit: 2022-12-16 | Discharge: 2022-12-16 | Payer: MEDICARE

## 2022-12-17 ENCOUNTER — Encounter: Admit: 2022-12-17 | Discharge: 2022-12-17 | Payer: MEDICARE

## 2022-12-19 ENCOUNTER — Encounter: Admit: 2022-12-19 | Discharge: 2022-12-19 | Payer: MEDICARE

## 2022-12-19 NOTE — Telephone Encounter
Received call from Amy, RN with [facility]. Stated that the patient has been having some swelling to the left side of her face. Requesting a callback.     Charlcie Cradle is s/p Left frontotemporal craniotomy, resection of middle cranial fossa meningioma with Dr. Francis Gaines on 11/26/2022.     Called and spoke with Amy, RN and Marge. Patient stated that she has been having some swelling to the left side of her face. Reported that she noticed it this AM when she put on her glasses and they weren't sitting correctly on her face. Denies any concerns with the incision. No redness, swelling, heat, or drainage. Denies any fevers as well. Patient stated that she was discharged from the swing bed facility last week. She resumed using her CPAP machine over the weekend. Recommended that the patient use an ice pack to the swollen area and ensure that she is sleeping elevated. Also recommended that she send in a photo for review as well. Patient verbalized understanding and voiced no additional concerns at this time.

## 2022-12-19 NOTE — Progress Notes
Alicia Fox returns for a 4 week post op appointment.     Her past medical history is as follow:  History of heart failure, lung cancer, breast cancer, and uterine cancer.    The breast and uterine cancer history is very remote.  She underwent local resection without any chemo or radiation therapy.    She was diagnosed with lung cancer 5 years ago treated with resection without adjuvant chemoradiation therapy.    She was referred to ophthalmology for visual symptoms and she is being managed for macular degeneration.    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 Valarie Merino and obtaining an MRI brain.       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  Her MRI in 2024 showed a significant increase in the size of the tumor and new surrounding edema.    She underwent a Left frontotemporal craniotomy, resection of middle cranial fossa meningioma on 11/26/2022.   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     Clinically she is doing very well.  She reports that her vision is stable  No speech difficulty at this point  Incision well healed    A/P:  Recovering well from surgery  I will see her back in 6 months with a follow-up MRI

## 2022-12-26 ENCOUNTER — Encounter: Admit: 2022-12-26 | Discharge: 2022-12-26 | Payer: MEDICARE

## 2022-12-26 ENCOUNTER — Ambulatory Visit: Admit: 2022-12-26 | Discharge: 2022-12-26 | Payer: MEDICARE

## 2022-12-26 DIAGNOSIS — M549 Dorsalgia, unspecified: Secondary | ICD-10-CM

## 2022-12-26 DIAGNOSIS — G4733 Obstructive sleep apnea (adult) (pediatric): Secondary | ICD-10-CM

## 2022-12-26 DIAGNOSIS — K929 Disease of digestive system, unspecified: Secondary | ICD-10-CM

## 2022-12-26 DIAGNOSIS — U071 Pneumonia due to COVID-19 virus: Secondary | ICD-10-CM

## 2022-12-26 DIAGNOSIS — E669 Obesity, unspecified: Secondary | ICD-10-CM

## 2022-12-26 DIAGNOSIS — D329 Benign neoplasm of meninges, unspecified: Secondary | ICD-10-CM

## 2022-12-26 DIAGNOSIS — F99 Mental disorder, not otherwise specified: Secondary | ICD-10-CM

## 2022-12-26 DIAGNOSIS — D539 Nutritional anemia, unspecified: Secondary | ICD-10-CM

## 2022-12-26 DIAGNOSIS — R3915 Urgency of urination: Secondary | ICD-10-CM

## 2022-12-26 DIAGNOSIS — I10 Essential (primary) hypertension: Secondary | ICD-10-CM

## 2022-12-26 DIAGNOSIS — E782 Mixed hyperlipidemia: Secondary | ICD-10-CM

## 2022-12-26 DIAGNOSIS — H547 Unspecified visual loss: Secondary | ICD-10-CM

## 2022-12-26 DIAGNOSIS — F32A Depression: Secondary | ICD-10-CM

## 2022-12-26 DIAGNOSIS — M25569 Pain in unspecified knee: Secondary | ICD-10-CM

## 2022-12-26 DIAGNOSIS — K319 Disease of stomach and duodenum, unspecified: Secondary | ICD-10-CM

## 2022-12-26 DIAGNOSIS — K219 Gastro-esophageal reflux disease without esophagitis: Secondary | ICD-10-CM

## 2022-12-26 DIAGNOSIS — G5603 Carpal tunnel syndrome, bilateral upper limbs: Secondary | ICD-10-CM

## 2022-12-26 DIAGNOSIS — C801 Malignant (primary) neoplasm, unspecified: Secondary | ICD-10-CM

## 2022-12-26 DIAGNOSIS — J189 Pneumonia, unspecified organism: Secondary | ICD-10-CM

## 2022-12-26 DIAGNOSIS — M5134 Other intervertebral disc degeneration, thoracic region: Secondary | ICD-10-CM

## 2022-12-26 DIAGNOSIS — H269 Unspecified cataract: Secondary | ICD-10-CM

## 2022-12-26 DIAGNOSIS — R350 Frequency of micturition: Secondary | ICD-10-CM

## 2022-12-26 DIAGNOSIS — Z0000 Encounter for general adult medical examination without abnormal findings: Secondary | ICD-10-CM

## 2022-12-26 DIAGNOSIS — G4730 Sleep apnea, unspecified: Secondary | ICD-10-CM

## 2022-12-26 DIAGNOSIS — C3490 Malignant neoplasm of unspecified part of unspecified bronchus or lung: Secondary | ICD-10-CM

## 2022-12-26 DIAGNOSIS — G5601 Carpal tunnel syndrome, right upper limb: Secondary | ICD-10-CM

## 2022-12-26 DIAGNOSIS — E079 Disorder of thyroid, unspecified: Secondary | ICD-10-CM

## 2022-12-26 DIAGNOSIS — E785 Hyperlipidemia, unspecified: Secondary | ICD-10-CM

## 2022-12-26 DIAGNOSIS — G5602 Carpal tunnel syndrome, left upper limb: Secondary | ICD-10-CM

## 2022-12-26 DIAGNOSIS — K5792 Diverticulitis of intestine, part unspecified, without perforation or abscess without bleeding: Secondary | ICD-10-CM

## 2022-12-26 DIAGNOSIS — E119 Type 2 diabetes mellitus without complications: Secondary | ICD-10-CM

## 2022-12-26 DIAGNOSIS — E039 Hypothyroidism, unspecified: Secondary | ICD-10-CM

## 2022-12-26 DIAGNOSIS — M51369 Degenerative disc disease, lumbar: Secondary | ICD-10-CM

## 2022-12-26 DIAGNOSIS — M810 Age-related osteoporosis without current pathological fracture: Secondary | ICD-10-CM

## 2022-12-26 DIAGNOSIS — M545 Low back pain: Secondary | ICD-10-CM

## 2022-12-26 DIAGNOSIS — Z1371 Encounter for nonprocreative screening for genetic disease carrier status: Secondary | ICD-10-CM

## 2022-12-26 DIAGNOSIS — Z860101 Hx of adenomatous colonic polyps: Secondary | ICD-10-CM

## 2022-12-26 DIAGNOSIS — M1990 Unspecified osteoarthritis, unspecified site: Secondary | ICD-10-CM

## 2022-12-26 DIAGNOSIS — T7840XA Allergy, unspecified, initial encounter: Secondary | ICD-10-CM

## 2022-12-26 LAB — CBC
HEMATOCRIT: 33 % — ABNORMAL LOW (ref 36–45)
HEMOGLOBIN: 10 g/dL — ABNORMAL LOW (ref 12.0–15.0)
MCH: 23 pg — ABNORMAL LOW (ref 26–34)
MCHC: 32 g/dL (ref 32.0–36.0)
MCV: 72 FL — ABNORMAL LOW (ref 80–100)
PLATELET COUNT: 410 10*3/uL — ABNORMAL HIGH (ref 150–400)
RBC COUNT: 4.6 M/UL — ABNORMAL HIGH (ref 4.0–5.0)
WBC COUNT: 4.8 10*3/uL (ref 4.5–11.0)

## 2022-12-26 LAB — COMPREHENSIVE METABOLIC PANEL
POTASSIUM: 3.9 MMOL/L — ABNORMAL HIGH (ref <100)
SODIUM: 142 MMOL/L (ref >40)

## 2022-12-26 LAB — TSH WITH FREE T4 REFLEX: TSH: 1.9 uU/mL (ref 0.35–5.00)

## 2022-12-26 LAB — LIPID PROFILE
CHOLESTEROL: 186 mg/dL (ref <200)
TRIGLYCERIDES: 134 mg/dL (ref <150)

## 2022-12-26 NOTE — Patient Instructions
It was a pleasure seeing you in clinic today! Dr. Francis Gaines would like to see you back in clinic in 6 months. Prior to your appointment, he would like updated imaging: a MRI. Our scheduling team should contact you to schedule your appointments.     If you have any questions or concerns, please call [phone] or send a message via Mychart.     Thank you!     Clemon Chambers., RN, BSN  Ambulatory Care Coordinator  Dr. Gwynn Burly: [phone]  F: [phone]  Department of Neurosurgery   [facility]

## 2022-12-26 NOTE — Telephone Encounter
Alicia Fox, OT for home health called to update regarding patient's BP. States saw patient yesterday for OT. BP 174/97 pulse 83 prior to therapy.   BP 180/93 pulse 83 after therapy  States patient was upset d/t some bad news she had received.   States called to check on patient that evening. Patient unable to take own BP d/t machine not working but feels better. Does have f/u appt with Dr. Francis Gaines today. No further updates.

## 2022-12-26 NOTE — Telephone Encounter
Received call from patient's home health team regarding elevated BP readings. Stated that patient received some news yesterday that could be the cause of the elevated readings. Reported that the patient has an appointment today with Dr. Francis Gaines. Reported that her BP will be checked at the visit today and will advised patient to reach out to her PCP for recommendations. Stated that [facility] will reach out to the PCP as well regarding the readings.

## 2022-12-31 ENCOUNTER — Encounter: Admit: 2022-12-31 | Discharge: 2022-12-31 | Payer: MEDICARE

## 2022-12-31 ENCOUNTER — Ambulatory Visit: Admit: 2022-12-31 | Discharge: 2022-12-31 | Payer: MEDICARE

## 2022-12-31 DIAGNOSIS — Z9889 Other specified postprocedural states: Secondary | ICD-10-CM

## 2022-12-31 DIAGNOSIS — E119 Type 2 diabetes mellitus without complications: Secondary | ICD-10-CM

## 2022-12-31 DIAGNOSIS — Z0000 Encounter for general adult medical examination without abnormal findings: Secondary | ICD-10-CM

## 2022-12-31 DIAGNOSIS — E782 Mixed hyperlipidemia: Secondary | ICD-10-CM

## 2022-12-31 DIAGNOSIS — I10 Essential (primary) hypertension: Secondary | ICD-10-CM

## 2022-12-31 MED ORDER — BLOOD-GLUCOSE METER MISC KIT
0 refills | Status: DC
Start: 2022-12-31 — End: 2022-12-31

## 2022-12-31 MED ORDER — LANCETS MISC MISC
1 | Freq: Every day | 3 refills | Status: DC | PRN
Start: 2022-12-31 — End: 2022-12-31

## 2022-12-31 MED ORDER — ONETOUCH DELICA PLUS LANCET 33 GAUGE MISC MISC
3 refills | 90.00000 days | Status: AC
Start: 2022-12-31 — End: ?

## 2022-12-31 MED ORDER — ONETOUCH ULTRA2 METER MISC MISC
RESPIRATORY_TRACT | 0 refills | 90.00000 days | Status: AC
Start: 2022-12-31 — End: ?

## 2022-12-31 MED ORDER — ESTRADIOL 0.01 % (0.1 MG/GRAM) VA CREA
1 g | VAGINAL | 3 refills | 30.00000 days | Status: AC
Start: 2022-12-31 — End: ?

## 2022-12-31 MED ORDER — BLOOD GLUCOSE TEST MISC STRP
1 | ORAL_STRIP | Freq: Every day | 3 refills | 50.00000 days | Status: DC
Start: 2022-12-31 — End: 2022-12-31

## 2022-12-31 MED ORDER — ONETOUCH ULTRA TEST MISC STRP
ORAL_STRIP | ORAL | 3 refills | 90.00000 days | Status: AC
Start: 2022-12-31 — End: ?

## 2022-12-31 NOTE — Progress Notes
Date of Service: 12/31/2022    Alicia Fox is a 72 y.o. female.  DOB: Sep 07, 1950  MRN: [id number]   she was last seen by me 05/29/2022      Subjective:            She presents today for an Annual Medicare Wellness visit. She was last seen by me 05/29/2022.  Patient overall is doing well status post surgery to remove part of the meningioma.  She is still doing home health right now.  Patient is concerned about swelling in her legs.  She has not been taking her Lasix daily.  Patient also wonders if she should check her blood sugars daily.  She has been well-controlled.  Patient is agreeable to flu vaccine today.  She feels like she is getting good interaction with people even though she cannot go up stairs.  She is having some difficulty sleeping.        Chief Complaint   Patient presents with    Annual Wellness Visit     Labs done  Lower extremity swelling  Covid or flu today?       Past Medical History:   Diagnosis Date    Allergy     Codeine    Arthritis     lumbar, knees    Back pain 1965    BRCA1 negative     BRCA2 negative     Cancer (HCC) 1986    breast-  LT    Cancer of lung (HCC) 2018    Carpal tunnel syndrome of left wrist 10/09/2016    Added automatically from request for surgery [id number]      Carpal tunnel syndrome of right wrist 10/22/2016    Carpal tunnel syndrome on both sides 2018    L > R    Cataract 2020    COVID-19 02/05/2019    Degenerative disc disease, lumbar 2023    Degenerative disc disease, thoracic 2023    Depression     Seasonal and close family and friends deaths    Disorder of thyroid gland     hypothyroid    Diverticulitis     DM (diabetes mellitus) (HCC)     Gastrointestinal disorder     gerd at times-under control with meds    GERD (gastroesophageal reflux disease)     HTN (hypertension)     Hx of adenomatous colonic polyps     Hyperlipidemia     Hypothyroid     Knee pain     right    Low back pain     Obesity     OSA on CPAP     Osteoporosis 2021    Osteopenia    Pneumonia bilateral bacterial    Pneumonia due to COVID-19 virus 02/15/2019    Psychiatric illness     depression    Sleep apnea 1999    Stomach disorder     Unspecified deficiency anemia 2023    Vision decreased     AMD & Cataracts     Surgical History:   Procedure Laterality Date    HX MASTECTOMY Bilateral 1986    1986-stomberg mastectomy    BREAST BIOPSY  07/1984    MASTECTOMY, PARTIAL Bilateral 12/1984    MASTECTOMY, MODIFIED RADICAL Bilateral 1987    1987-full modified    BREAST AUGMENTATION Bilateral 1987    BREAST SURGERY Bilateral 07/1985    implants    HX DILATION AND CURETTAGE  07/1994    HX HYSTERECTOMY  10/1994  complete    HX ABDOMINOPLASTY  10/1994    HIP SURGERY  02/1995    HX LIPOMA RESECTION  11/1999    outer upper left arm    ANUS SURGERY  February 17, 2010    anal fissure    HX LIPOMA RESECTION  02/2010    inside upper right arm    LEFT DECOMPRESSION NERVE MEDIAN WITH CARPAL TUNNEL RELEASE Left 10/17/2016    Performed by Delice Lesch, MD at [facility] OR    BRONCHOSCOPY N/A 01/13/2017    Performed by Audie Box, MD at [facility] OR    BRONCHOSCOPY WITH TRANSBRONCHIAL BIOPSY N/A 01/13/2017    Performed by Audie Box, MD at [facility] OR    BRONCHOSCOPY WITH ENDOBRONCHIAL ULTRASOUND GUIDED TRANSTRACHEAL/ TRANSBRONCHIAL SAMPLING - 3 OR MORE MEDIASTINAL/ HILAR LYMPH NODE STATIONS/ STRUCTURE - RIGID N/A 01/13/2017    Performed by Audie Box, MD at [facility] OR    BRONCHOSCOPY WITH IMAGE - GUIDED NAVIGATION - FLEXIBLE N/A 01/13/2017    Performed by Audie Box, MD at [facility] OR    ROBOTIC LEFT LOWER LOBECTOMY Left 03/21/2017    Performed by Particia Nearing, MD at [facility] CVOR    Colonoscopy N/A 10/01/2019    Performed by Meyer Cory, MD at [facility] ENDO    ESOPHAGOGASTRODUODENOSCOPY WITH SPECIMEN COLLECTION BY BRUSHING/ WASHING N/A 10/01/2019    Performed by Meyer Cory, MD at [facility] ENDO    ESOPHAGOGASTRODUODENOSCOPY WITH SNARE REMOVAL TUMOR/ POLYP/ OTHER LESION - FLEXIBLE N/A 10/01/2019    Performed by Meyer Cory, MD at [facility] ENDO ESOPHAGOGASTRODUODENOSCOPY WITH BIOPSY - FLEXIBLE N/A 10/01/2019    Performed by Meyer Cory, MD at [facility] ENDO    COLONOSCOPY WITH SNARE REMOVAL TUMOR/ POLYP/ OTHER LESION  10/01/2019    Performed by Meyer Cory, MD at [facility] ENDO    Colonoscopy N/A 10/02/2021    Performed by Meyer Cory, MD at [facility] OR    Left fronto-temporal craniotomy, resection of middle fossa tumor Left 11/26/2022    Performed by Colin Benton, MD at [facility] OR    Left fronto-temporal craniotomy, resection of middle fossa tumor Left 11/26/2022    Performed by Colin Benton, MD at [facility] OR    CARDIOVASCULAR STRESS TEST  02/17/2022    ECHOCARDIOGRAM PROCEDURE  February 18, 2023    HX LOBECTOMY  2018-02-17    Lower left lung removed.  Cancer    HYSTERECTOMY      PERICARDIOCENTESIS  Feb 18, 2020    Heart Failure Due to Covid; pericardial effusion    SPINE SURGERY  February 18, 1992 and 1996    cervical fusion     Family History   Problem Relation Name Age of Onset    Complete Hysterectomy Mother Thayer Headings 09        uterine prolaps    Hypertension Mother Thayer Headings         Covid-19 fatality Feb 2021    Atrial Fibrillation Mother Dianna Limbo [facility]     Hypertension Father Manuela Neptune         COPD, PARKINSON'S death Feb 17, 2017    COPD Father Manuela Neptune     Heart Failure Father Manuela Neptune     Parkinson's  Father Manuela Neptune     Vision Loss Father Manuela Neptune         AMD    Hypertension Brother Tom         Pericardiocentesus    Kidney Cancer Brother Len Scaturro 60    Cancer-Prostate Brother Len Scaturro 60  Hypertension Brother Winn Jock         A-fib. 2023    Cancer Brother Len Scaturro         Prostrate and kidney    Birth Defect Brother Theora Master         Mentally handicapped    Early Death Brother Theora Master         Pneumonia staph sepsis  at 89yrs    Mental Illness Brother Theora Master     Intellectual Disability Brother      Heart Attack Paternal Uncle      Cancer-Hematologic Maternal Grandfather          leukemia    Cancer-Breast Paternal Grandmother  65    Cancer-Breast Maternal Aunt  32     Social History     Socioeconomic History    Marital status: Widowed   Occupational History    Occupation: retired   Tobacco Use    Smoking status: Former     Current packs/day: 0.00     Average packs/day: 0.5 packs/day for 15.0 years (7.5 ttl pk-yrs)     Types: Cigarettes     Start date: 03/04/1974     Quit date: 05/02/1989     Years since quitting: 33.6    Smokeless tobacco: Never    Tobacco comments:     quit in 1991   Vaping Use    Vaping status: Never Used   Substance and Sexual Activity    Alcohol use: Not Currently    Drug use: No    Sexual activity: Not Currently     Partners: Male     Birth control/protection: Surgical, Abstinence   Social History Narrative    11/26/2022 - Lives alone.  Independent in ADLs and iADLs.  Uses a cane due to bad knees and back pain.  Quit smoking years ago.  Does not drink alcohol or use drugs.  Widowed.  2 daughters.  Retired - worked previously in Water engineer.  Helps with her grandchildren.  Had COVID a few weeks ago - some brain fog with that.            I reviewed medications, allergies, problem list and tobacco history at this visit.    A Health Risk assessment was performed by the patient today & reviewed with the patient.  Health Risk Assessment Questionnaire  Current Care  List of Providers you have seen in the last two years: R. Chamoun neuro surgeon;M Hannan cardiologist ;Littie Deeds urologist ;[facility] gastrologist;[facility] podiatrist  Are you receiving home health?: (!) Yes  During the past 4 weeks, how would you rate your health in general?: (!) Fair    Outside Care  Since your last PCP visit, have you received care outside of [facility]?: (!) Yes  What type of care did you receive outside of [facility]? (select all that apply): (!) Specialty Visit, Emergency Room Visit  What is the Facility where you received care and the provider's name?: [facility] for Covid and UTI;[facility] following [facility] neuro brain mass surgery R. Chamoun    Physical Activity  Do you exercise or are you physically active?: (!) No          Diet  In the past month, were you worried whether your food would run out before you or your family had money to buy more?: No  In the past 7 days, how many times did you eat fast food or  junk food or pizza?: 0  In the past 7 days, how many servings of fruits or vegetables did you eat each day?: (!) 2-3  In the past 7 days, how many sodas and sugar sweetened drinks (regular, not diet) did you drink each day?: 0    Smoke/Tobacco Use  Are you currently a smoker?: No      Alcohol Use  Do you drink alcohol?: No          Depression Screen  Little interest or pleasure in doing things: Several days  Feeling down, depressed or hopeless: Several days  Patient Scores:  PHQ-2: PHQ-2 Score: 2 (12/24/2022  9:18 AM)  Depression screening 6 minutes  PHQ-9: PHQ-9 Score: 8 (12/24/2022  9:18 AM)    Interventions:  PHQ-2: PHQ-2 Score less than 3: No follow-up or recommendations are necessary at this time (09/10/2022 11:09 AM)    Depression Interventions PHQ-2/9: No data recorded      Pain  How would you rate your pain today?: (!) Moderate pain    Ambulation  Do you use any assistive devices for ambulation?: (!) Yes  What types of device? (select all that apply): Another person, Dan Humphreys    Fall Risk  Does it take you longer than 30 seconds to get up and out of a chair?: No  Have you fallen in the past year?: (!) Yes  Fall History (last 34mo): (!) Two or More Falls    Motor Vehicle Safety  Do you fasten your seat belt when you are in the car?: Yes    Sun Exposure  Do you protect yourself from the sun? For example, wear sunscreen when outside.: Yes    Hearing Loss  Do you have trouble hearing the television or radio when others do not?: No  Do you have to strain or struggle to hear/understand conversation?: No  Do you use hearing aids?: No    Cognitive Impairment  During the past 12 months, have you experienced confusion or memory loss that is happening more often or is getting worse?: No    Functional Screen  Do you live alone?: No  Do you live at: Home  Can you drive your own car or travel alone by bus or taxi?: (!) No  Can you shop for groceries or clothes without help?: (!) No  Can you prepare your own meals?: Yes  Can you do your own housework without help?: (!) No  Can you handle your own money without help?: Yes  Do you need help eating, bathing, dressing, or getting around your home?: No  Do you feel safe?: Yes  Does anyone at home hurt you, hit you, or threaten you?: No  Have you ever been the victim of abuse?: No    Home Safety  Does your home have grab bars in the bathroom?: Yes  Does your home have hand rails on stairs and steps?: Yes  Does your home have functioning smoke alarms?: Yes    Advance Directive  Do you have a living will or Advance Directive?: Yes      Dental Screen  Have you had an exam by your dentist in the last year?: Yes    Vision Screen  Do you have diabetes?: (!) Yes  When was your last eye exam?: 07/16/22  Eye Doctor Name?: Philippa Chester  Eye Doctor Facility?: [facility]    In the last 12 months, has your utility company shut off your service for not paying your bills?: No (05/26/2022 12:30  PM)  Are you worried that in the next 2 months, you may not have stable housing?: No (05/26/2022 12:30 PM)  Are you afraid that you may be hurt in your home by someone you know?: No (05/26/2022 12:30 PM)  Are you afraid you might be hurt in your apartment building or neighborhood?: No (05/26/2022 12:30 PM)  Do problems getting childcare make it difficult to work or study?: No (05/26/2022 12:30 PM)  In the last 12 months, have you needed to see a doctor, but could not because of cost?: No (05/26/2022 12:30 PM)  In the last 12 months, did you skip medications to save money?: No (05/26/2022 12:30 PM)  In the past 12 months, have you ever gone without  health care because you didn't have a way to get there?: No (05/26/2022 12:30 PM)  Do you have problems understanding what is told to you about your medical conditions?: No (05/26/2022 12:30 PM)  Do you often feel that you lack companionship?: No (05/26/2022 12:30 PM)  If you answered yes to any questions above, would you like to discuss help with your social work team?: No (05/26/2022 12:30 PM)        Personal prevention Plan reviewed with patient.    While the patient was here today, due to his/her multiple chronic conditions it would be in the best interest of the patient for me to monitor, assess and evaluate those as well. They are as follows.  Patient Active Problem List    Diagnosis Date Noted    History of COVID-19 05/13/2019     Priority: Medium     Overview Note:     She developed COVID in 12/20 with week long hospitalization and need for oxygen/steroids but not sent home on oxygen.  She apparently had pericardial effusion also as complication but no recurrence since single pericardiocentesis in March 2021.       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    Type 2 diabetes mellitus without complication, without long-term current use of insulin (HCC) 12/26/2021    Stress incontinence of urine 12/20/2020    Multilevel degenerative disc disease 11/21/2020    Other insomnia 08/16/2020    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  Limb movement with index of 30.8 events per hour and nocturnal hypoxemia with oxygen desaturation nadir of 77% oxygen saturation below 88% for about 99.1 minutes    Pt set up with CPAP @ 12cm  H2O on 12/04/2017.  DME: [name]      Carcinoid tumor of left lung (HCC) 12/17/2016     Overview Note:     On 03/21/17 she underwent a Robotic assisted left lower lobectomy under the direction of Dr. Particia Nearing.  Pathology impression:  Lung: Typical carcinoid tumor.   Greatest dimension (centimeters): 3.0 cm. Additional dimensions (centimeters): 2.5 x 2.5 cm.  Pathologic Stage Classification (pTNM, AJCC 8th Edition): pT1c N0       Spondylosis of cervical region without myelopathy or radiculopathy 09/24/2016    Carpal tunnel syndrome, bilateral 08/06/2016    Abnormal CT of the head 08/06/2016    Cough 08/09/2014    [place] cardiac risk <10% in next 10 years 08/09/2014    Bilateral edema of lower extremity 04/28/2014    Diastolic heart failure (HCC) 04/28/2014    Hyperlipemia 01/18/2010    Hypertension 07/21/2008    Fen-phen history 07/21/2008     Other concerns addressed at this visit -  Sleep difficulties, lower extremity swelling    Records requested at the time of this visit:No    Prior consultations, labs, radiology reports reviewed at the time of this visit.Yes: Most recent labs, specialty visits, hospital stay, imaging      BMI:  Body mass index is 45.14 kg/m?Marland Kitchen  No data recorded  Wt Readings from Last 10 Encounters:   12/31/22 115.6 kg (254 lb 12.8 oz)   12/26/22 111.1 kg (245 lb)   12/03/22 78.5 kg (173 lb)   11/15/22 115.7 kg (255 lb)   10/24/22 113.4 kg (250 lb)   09/10/22 113.4 kg (250 lb)   06/03/22 110.7 kg (244 lb)   05/29/22 114.1 kg (251 lb 8 oz)   03/20/22 115.2 kg (254 lb)   02/19/22 116.3 kg (256 lb 6.4 oz)            Review of Systems   Constitutional: Positive for malaise/fatigue. Negative for fever.   Cardiovascular:  Positive for leg swelling.   Musculoskeletal:  Positive for muscle weakness.   Gastrointestinal:  Negative for abdominal pain.   Psychiatric/Behavioral:  The patient has insomnia.            Objective:          acetaminophen (TYLENOL) 325 mg tablet Take two tablets by mouth every 6 hours as needed. For fever or pain.    ascorbic acid (VITAMIN C) 500 mg tablet Take one tablet by mouth daily.    atorvastatin (LIPITOR) 10 mg tablet TAKE 1 TABLET BY MOUTH EVERY DAY (Patient taking differently: Take one tablet by mouth at bedtime daily.)    blood sugar diagnostic (BLOOD GLUCOSE TEST) test strip Use one strip as directed daily before breakfast. Diagnosis Code: DM-2  Indications: type 2 diabetes mellitus    Blood-Glucose Meter kit Use as directed. Diagnosis Code: DM 2  Indications: type 2 diabetes mellitus    carvediloL (COREG) 3.125 mg tablet TAKE ONE TABLET BY MOUTH TWICE DAILY WITH MEALS. TAKE WITH FOOD.    cetirizine (ZYRTEC) 10 mg tablet Take one tablet by mouth every morning.    cholecalciferol (VITAMIN D-3) 1,000 units tablet Take one tablet by mouth daily.    cranberry conc-ascorbic acid 4,200-20 mg cap Take 2 capsules by mouth every morning.    d-mannose (AZO D-MANNOSE) 500 mg cap Take four capsules by mouth daily.    [START ON 01/02/2023] estradioL (ESTRACE) 0.01 % (0.1 mg/g) vaginal cream Insert or Apply one g to vaginal area  twice weekly.    gabapentin (NEURONTIN) 100 mg capsule Take two capsules by mouth twice daily. Indications: neuropathic pain    guaiFENesin LA (MUCINEX) 600 mg tablet Take one tablet by mouth every morning.    lancets MISC Use one each as directed daily as needed. Diag Code: DM-2 Indications: type 2 diabetes mellitus    levothyroxine (SYNTHROID) 88 mcg tablet TAKE 1 TABLET BY MOUTH EVERY DAY IN THE MORNING    losartan (COZAAR) 50 mg tablet TAKE 1 TABLET BY MOUTH TWICE A DAY    melatonin 10 mg cap Take one capsule by mouth daily.    meloxicam (MOBIC) 15 mg tablet TAKE 1 TABLET BY MOUTH EVERY DAY AS NEEDED FOR PAIN (Patient taking differently: Take one tablet by mouth every morning.)    metFORMIN (GLUCOPHAGE) 500 mg tablet Take two tablets by mouth twice daily with meals. Indications: type 2 diabetes mellitus    other medication Medication Name & Strength (List each active ingredient & strength): Emma  Dose (how much): 2  Route: by mouth Frequency (how often): daily    other medication Medication Name & Strength (List each active ingredient & strength): vitamin K2 & K7  Dose (how much): 1 Route: by mouth  Frequency (how often): daily    oxybutynin XL (DITROPAN XL) 10 mg tablet Take one tablet by mouth twice daily. Do not cut/ crush/ chew    pantoprazole DR (PROTONIX) 40 mg tablet TAKE 1 TABLET BY MOUTH TWICE A DAY    polycarbophil (FIBERCON) 625 mg tablet Take one tablet by mouth twice daily.    polyethylene glycol 3350 (MIRALAX) 17 g packet Take one packet by mouth daily as needed.    potassium chloride (KLOR-CON SPRINKLE) 10 mEq capsule Take one capsule by mouth daily as needed (with torsemide). Indications: prevention of low potassium in the blood    torsemide (DEMADEX) 20 mg tablet Take one tablet by mouth daily as needed (edema). May take 2nd daily dose if needed.  Indications: visible water retention    vit C,E-Zn-coppr-lutein-zeaxan (PRESERVISION AREDS-2) 250-200-40-1 mg-unit-mg-mg cap Take 1 capsule by mouth twice daily.    Zinc 50 mg tab Take one tablet by mouth daily.     Vitals:    12/31/22 1018   BP: 134/80   BP Source: Arm, Right Upper   Pulse: 82   Temp: 36.8 ?C (98.2 ?F)   SpO2: 96%   TempSrc: Temporal   PainSc: Four   Weight: 115.6 kg (254 lb 12.8 oz)   Height: 160 cm (5' 3) Vitals:    12/31/22 1018   BP: 134/80   BP Source: Arm, Right Upper   Patient Position: Standing   Pulse: 82         Body mass index is 45.14 kg/m?Marland Kitchen     Physical Exam  Constitutional:       Appearance: Normal appearance.   HENT:      Right Ear: Tympanic membrane, ear canal and external ear normal.      Left Ear: Tympanic membrane, ear canal and external ear normal.   Cardiovascular:      Rate and Rhythm: Normal rate.   Pulmonary:      Effort: Pulmonary effort is normal.      Breath sounds: Normal breath sounds.   Musculoskeletal:         General: Deformity: 1+.      Right lower leg: Edema (1+) present.      Left lower leg: Edema present.   Neurological:  General: No focal deficit present.      Mental Status: She is alert and oriented to person, place, and time.   Psychiatric:         Mood and Affect: Mood normal.         Behavior: Behavior normal.         Health Maintenance   Topic Date Due    INFLUENZA VACCINE (1) 10/03/2022    COVID-19 VACCINE (9 - 2024-25 season) 11/03/2022    DIABETES FOOT EXAM (.dmfoot)  05/29/2023    DIABETES MICROALBUMIN TO CREATININE RATIO  05/29/2023    DIABETES HBA1C  06/26/2023    BREAST CANCER SCREENING  10/03/2023    DIABETES EGFR SCREEN  12/26/2023    MEDICARE ANNUAL WELLNESS VISIT  12/31/2023    ADVANCE CARE PLANNING DISCUSSION AND DOCUMENTATION  12/31/2023    DIABETES DILATED EYE EXAM  05/19/2024    COLORECTAL CANCER SCREENING  10/02/2024    DTAP/TDAP VACCINES (3 - Td or Tdap) 01/12/2032    OSTEOPOROSIS SCREENING/MONITORING  Completed    SHINGLES RECOMBINANT VACCINE  Completed    PNEUMOCOCCAL VACCINE 65+ YRS  Completed    HEPATITIS C SCREENING  Completed    RSV VACCINE (60 YEARS AND OLDER)  Completed    HPV VACCINES  Aged Out        The 10-year ASCVD risk score (Arnett DK, et al., 2019) is: 32.3%    Values used to calculate the score:      Age: 36 years      Sex: Female      Is Non-Hispanic African American: No      Diabetic: Yes      Tobacco smoker: No      Systolic Blood Pressure: 134 mmHg      Is BP treated: Yes      HDL Cholesterol: 66 MG/DL      Total Cholesterol: 186 MG/DL        Assessment and Plan:    Nyree A. Anabiya Beauford was seen today for annual wellness visit.    Diagnoses and all orders for this visit:    Encounter for Medicare annual wellness exam    Primary hypertension    Mixed hyperlipidemia    Type 2 diabetes mellitus without complication, without long-term current use of insulin (HCC)    Hx of resection of meningioma    Other orders  -     FLU VACCINE =>65 YO HIGH-DOSE TRIVALENT PF  -     estradioL (ESTRACE) 0.01 % (0.1 mg/g) vaginal cream; Insert or Apply one g to vaginal area twice weekly.  -     Blood-Glucose Meter kit; Use as directed. Diagnosis Code: DM 2  Indications: type 2 diabetes mellitus  -     lancets MISC; Use one each as directed daily as needed. Diag Code: DM-2  Indications: type 2 diabetes mellitus  -     blood sugar diagnostic (BLOOD GLUCOSE TEST) test strip; Use one strip as directed daily before breakfast. Diagnosis Code: DM-2  Indications: type 2 diabetes mellitus    Reviewed HRA.  Flu vaccine today.  Patient is up-to-date on age-appropriate screening    Hypertension controlled.  Continue current meds    Hyperlipidemia controlled.  Continue atorvastatin    Type 2 diabetes controlled.  Continue current meds.  Order glucometer to check daily as needed    History of resection of meningioma.  Patient is doing well.  She is undergoing home health right now and follows up  with neurosurgery.    Urinary incontinence and frequent UTIs.  Will try estradiol vaginal cream 2-3 times weekly to see if this improves symptoms and recurrent UTIs.    Encounter Medications   Medications    estradioL (ESTRACE) 0.01 % (0.1 mg/g) vaginal cream     Sig: Insert or Apply one g to vaginal area twice weekly.     Dispense:  42.5 g     Refill:  3    Blood-Glucose Meter kit     Sig: Use as directed. Diagnosis Code: DM 2  Indications: type 2 diabetes mellitus Dispense:  1 each     Refill:  0    lancets MISC     Sig: Use one each as directed daily as needed. Diag Code: DM-2  Indications: type 2 diabetes mellitus     Dispense:  100 each     Refill:  3    blood sugar diagnostic (BLOOD GLUCOSE TEST) test strip     Sig: Use one strip as directed daily before breakfast. Diagnosis Code: DM-2  Indications: type 2 diabetes mellitus     Dispense:  100 strip     Refill:  3     Patient Instructions     Health Maintenance   Topic Date Due    INFLUENZA VACCINE (1) 10/03/2022    COVID-19 VACCINE (9 - 2024-25 season) 11/03/2022    DIABETES FOOT EXAM (.dmfoot)  05/29/2023    DIABETES MICROALBUMIN TO CREATININE RATIO  05/29/2023    DIABETES HBA1C  06/26/2023    BREAST CANCER SCREENING  10/03/2023    DIABETES EGFR SCREEN  12/26/2023    MEDICARE ANNUAL WELLNESS VISIT  12/31/2023    ADVANCE CARE PLANNING DISCUSSION AND DOCUMENTATION  12/31/2023    DIABETES DILATED EYE EXAM  05/19/2024    COLORECTAL CANCER SCREENING  10/02/2024    DTAP/TDAP VACCINES (3 - Td or Tdap) 01/12/2032    OSTEOPOROSIS SCREENING/MONITORING  Completed    SHINGLES RECOMBINANT VACCINE  Completed    PNEUMOCOCCAL VACCINE 65+ YRS  Completed    HEPATITIS C SCREENING  Completed    RSV VACCINE (60 YEARS AND OLDER)  Completed    HPV VACCINES  Aged Out      Visit Disposition       Dispositions    Return in about 6 months (around 07/01/2023) for  DM f/u, BP f/u.            Future Appointments   Date Time Provider Department Center   01/03/2023  9:20 AM Malon Kindle, MD [facility] Ophthalmolog   01/23/2023 11:30 AM Rubye Oaks, Lindell Noe, APRN-NP [facility] IM     Return in about 6 months (around 07/01/2023) for  DM f/u, BP f/u.    I reviewed with the patient their current medications and specifically any new medications prescribed at the time of this visit and we reviewed the expected benefits and potential side effects. All questions are answered to the patient's satisfaction.    Health maintenance gaps were reviewed with the patient at the time of this visit.    I emphasized the importance of medication adherence.   The medical problems/diagnoses listed under assessment and plan were addressed at this visit and unless otherwise stated are adequately controlled.        Problem   Hx of Resection of Meningioma   Elevated Hemoglobin A1c (Resolved)

## 2022-12-31 NOTE — Patient Instructions
Health Maintenance   Topic Date Due    INFLUENZA VACCINE (1) 10/03/2022    COVID-19 VACCINE (9 - 2024-25 season) 11/03/2022    DIABETES FOOT EXAM (.dmfoot)  05/29/2023    DIABETES MICROALBUMIN TO CREATININE RATIO  05/29/2023    DIABETES HBA1C  06/26/2023    BREAST CANCER SCREENING  10/03/2023    DIABETES EGFR SCREEN  12/26/2023    MEDICARE ANNUAL WELLNESS VISIT  12/31/2023    ADVANCE CARE PLANNING DISCUSSION AND DOCUMENTATION  12/31/2023    DIABETES DILATED EYE EXAM  05/19/2024    COLORECTAL CANCER SCREENING  10/02/2024    DTAP/TDAP VACCINES (3 - Td or Tdap) 01/12/2032    OSTEOPOROSIS SCREENING/MONITORING  Completed    SHINGLES RECOMBINANT VACCINE  Completed    PNEUMOCOCCAL VACCINE 65+ YRS  Completed    HEPATITIS C SCREENING  Completed    RSV VACCINE (60 YEARS AND OLDER)  Completed    HPV VACCINES  Aged Out

## 2022-12-31 NOTE — Progress Notes
Patient came into office for High dose flu  injection.  Patient tolerated well. Injection was given in the Right deltoid .  Patient signed Consent and was given the VIS.  Pt able to ambulate out of office on their own.  Vaccine was documented in the patients chart.

## 2023-01-01 ENCOUNTER — Encounter: Admit: 2023-01-01 | Discharge: 2023-01-01 | Payer: MEDICARE

## 2023-01-01 MED ORDER — SULFAMETHOXAZOLE-TRIMETHOPRIM 800-160 MG PO TAB
1 | ORAL_TABLET | Freq: Two times a day (BID) | ORAL | 0 refills | Status: AC
Start: 2023-01-01 — End: ?

## 2023-01-02 ENCOUNTER — Encounter: Admit: 2023-01-02 | Discharge: 2023-01-02 | Payer: MEDICARE

## 2023-01-02 NOTE — Progress Notes
Body mass index is 44.82 kg/m?.      OCT OPTIC NERVE          Optic Nerve  Right Eye  Optic nerve findings: Normal Progression has no prior data.     Left Eye  Optic nerve findings: Normal Progression has no prior data.     Notes  OCT-mac:  reticular pattern ARMD         VISUAL FIELD, EXTEND          Humphrey Automated exam type was used.     Right Eye  Threshold was 24-2. Strategy was SITA Standard. Findings location Inferior Non-Specific Defects     Left Eye  Threshold was 24-2. Strategy was SITA Standard. Normal               Assessment and Plan:  This nice lady was sent here by her PCP Dr. Orson Gear for our evaluation of reported macular degeneration  and optic nerve disease based on OCT imaging and examination by Dr Dominica Severin.  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  She had left frontotemporal craniotomy with resection of the mass on 11/26/22 by Dr. Francis Gaines.  At her 12/26/22 f/up visit with Dr. Francis Gaines she had reported stable vision.    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  Her visual fields today (HVF 24-2) are completely normal.  OCT imaging of the optic nerves demonstrate no evidence of RNFL thinning.  Macular OCT demonstrated reticular pattern dry macular degeneration.  External exam shows dermatochalasis w/ ptosis worse OD than OS.  Muscle balance and motility/cranial nerve exam was unremarkable.  Anterior segment demonstrates bilateral nuclear cataracts, slightly worse OD than OS.  Dilated exam was remarkable only for her macular degeneration.    In summary, we see no evidence of visual changes related to her meningioma.  Her cataracts are becoming significant so we suggest recheck of her cataracts, ARMD, and her optic nerves in 4 months.

## 2023-01-03 ENCOUNTER — Encounter: Admit: 2023-01-03 | Discharge: 2023-01-03 | Payer: MEDICARE

## 2023-01-03 ENCOUNTER — Ambulatory Visit: Admit: 2023-01-03 | Discharge: 2023-01-03 | Payer: MEDICARE

## 2023-01-03 DIAGNOSIS — H02401 Unspecified ptosis of right eyelid: Secondary | ICD-10-CM

## 2023-01-03 DIAGNOSIS — H3530 Unspecified macular degeneration: Secondary | ICD-10-CM

## 2023-01-03 DIAGNOSIS — H02831 Dermatochalasis of right upper eyelid: Secondary | ICD-10-CM

## 2023-01-03 DIAGNOSIS — Z9889 Other specified postprocedural states: Secondary | ICD-10-CM

## 2023-01-03 DIAGNOSIS — H2513 Age-related nuclear cataract, bilateral: Secondary | ICD-10-CM

## 2023-01-04 DIAGNOSIS — Z8603 Personal history of neoplasm of uncertain behavior: Secondary | ICD-10-CM

## 2023-01-14 ENCOUNTER — Encounter: Admit: 2023-01-14 | Discharge: 2023-01-14 | Payer: MEDICARE

## 2023-01-21 ENCOUNTER — Encounter: Admit: 2023-01-21 | Discharge: 2023-01-21 | Payer: MEDICARE

## 2023-01-22 ENCOUNTER — Encounter: Admit: 2023-01-22 | Discharge: 2023-01-22 | Payer: MEDICARE

## 2023-01-23 ENCOUNTER — Encounter: Admit: 2023-01-23 | Discharge: 2023-01-23 | Payer: MEDICARE

## 2023-01-27 ENCOUNTER — Encounter: Admit: 2023-01-27 | Discharge: 2023-01-27 | Payer: MEDICARE

## 2023-01-27 DIAGNOSIS — R0602 Shortness of breath: Secondary | ICD-10-CM

## 2023-01-28 ENCOUNTER — Encounter: Admit: 2023-01-28 | Discharge: 2023-01-28 | Payer: MEDICARE

## 2023-01-28 ENCOUNTER — Ambulatory Visit: Admit: 2023-01-28 | Discharge: 2023-01-29 | Payer: MEDICARE

## 2023-02-03 ENCOUNTER — Encounter: Admit: 2023-02-03 | Discharge: 2023-02-03 | Payer: MEDICARE

## 2023-02-04 ENCOUNTER — Encounter: Admit: 2023-02-04 | Discharge: 2023-02-04 | Payer: MEDICARE

## 2023-02-04 MED ORDER — LOSARTAN 50 MG PO TAB
ORAL_TABLET | ORAL | 2 refills | 90.00000 days | Status: AC
Start: 2023-02-04 — End: ?

## 2023-02-11 ENCOUNTER — Encounter: Admit: 2023-02-11 | Discharge: 2023-02-11 | Payer: MEDICARE

## 2023-02-17 ENCOUNTER — Encounter: Admit: 2023-02-17 | Discharge: 2023-02-17 | Payer: MEDICARE

## 2023-02-18 ENCOUNTER — Encounter: Admit: 2023-02-18 | Discharge: 2023-02-18 | Payer: MEDICARE

## 2023-02-18 NOTE — Telephone Encounter
An encounter has been created for documentation only (often for preparation of an upcoming appointment or for follow up on orders/imaging) and patient does not need contact RN and did not miss a phone call or appointment.     RN prepped patient information for clinic with Christie Beckers APRN NP on 04/09/23 at 1300 via in person appt.     Here for delayed 12 mo follow up  Dx:OSA  Pt known to sleep department by Dr. Andria Meuse    Split night PAP titration 10/2017 with AHI 105.6  Previous cpap 12     LV: 10/18/21 with Christie Beckers   Meds:melatonin    Pt quit smoking > 30 yrs    Pt had a left temporal craniotomy in Sept, 2024    Last ct chest WO contrast 05/30/22  PFT scheduled for 04/09/23  Last PFT 11/02/21    ZOX:[id number]- Mendel Ryder, [place]  RN obtained download from AV

## 2023-02-20 ENCOUNTER — Encounter: Admit: 2023-02-20 | Discharge: 2023-02-20 | Payer: MEDICARE

## 2023-02-20 DIAGNOSIS — S91109S Unspecified open wound of unspecified toe(s) without damage to nail, sequela: Secondary | ICD-10-CM

## 2023-02-20 NOTE — Telephone Encounter
LOV with Dr.Stone 01/28/23  -augmentin for left breast abscess.    To Dr.Stone for review.  Stefan Church, RN

## 2023-02-21 ENCOUNTER — Encounter: Admit: 2023-02-21 | Discharge: 2023-02-21 | Payer: MEDICARE

## 2023-03-13 ENCOUNTER — Encounter: Admit: 2023-03-13 | Discharge: 2023-03-13 | Payer: MEDICARE

## 2023-03-13 MED ORDER — CARVEDILOL 3.125 MG PO TAB
3.125 mg | ORAL_TABLET | Freq: Two times a day (BID) | ORAL | 1 refills | 90.00000 days | Status: AC
Start: 2023-03-13 — End: ?

## 2023-03-13 NOTE — Telephone Encounter
03/13/2023 7:02 AM     Received a request via computer from the patients pharmacy requesting a refill.  Script e-scribed as requested.

## 2023-03-17 ENCOUNTER — Encounter: Admit: 2023-03-17 | Discharge: 2023-03-17 | Payer: MEDICARE

## 2023-03-17 DIAGNOSIS — L659 Nonscarring hair loss, unspecified: Secondary | ICD-10-CM

## 2023-03-20 ENCOUNTER — Encounter: Admit: 2023-03-20 | Discharge: 2023-03-20 | Payer: MEDICARE

## 2023-03-25 ENCOUNTER — Encounter: Admit: 2023-03-25 | Discharge: 2023-03-25 | Payer: MEDICARE

## 2023-04-01 ENCOUNTER — Encounter: Admit: 2023-04-01 | Discharge: 2023-04-01 | Payer: MEDICARE

## 2023-04-01 ENCOUNTER — Ambulatory Visit: Admit: 2023-04-01 | Discharge: 2023-04-01 | Payer: MEDICARE

## 2023-04-06 ENCOUNTER — Encounter: Admit: 2023-04-06 | Discharge: 2023-04-06 | Payer: MEDICARE

## 2023-04-08 NOTE — Progress Notes
 Date of Service: 04/11/2023    Subjective:             Alicia Fox is a 73 y.o. female.    History of Present Illness  I had the pleasure of seeing Alicia Fox in clinic for OSA follow-up. She is known to Dr. Andria Meuse. 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 She is wearing a full face mask. Pressure is comfortable.   CPAP has been going well. Does not think she could sleep without it  Getting supplies ok from DME   Denies shortness of breath and chest pain at night   Struggling to sleep more recently, keeping tv on a lot      Taking melatonin 10mg  nightly, no side effects    Had meningioma removed 11/2022- still struggling with residual effects of this, balance issues  Having a lot of hair loss as well     Cough is better         04/07/2023     3:08 PM 04/02/2023     2:59 PM 01/16/2023    11:47 AM   Epworth Sleepiness Scale   Sitting and reading 2 2 2    Watching TV 1 1 2    Sitting inactive in a public place (e.g. a theater or a meeting) 0 0 0   As a passenger in a car for an hour without a break 0 0 0   Lying down to rest in the afternoon when circumstances permit 3 3 3    Sitting and talking to someone 0 0 0   Sitting quietly after a lunch without alcohol 2 2 1    In a car, while stopped for a few minutes in traffic 0 0 0   Epworth Sleepiness Scale Score 8 8  8         Patient-reported                   Objective:         acetaminophen (TYLENOL) 325 mg tablet Take two tablets by mouth every 6 hours as needed. For fever or pain.    amoxicillin-potassium clavulanate (AUGMENTIN) 875/125 mg tablet Take one tablet by mouth every 12 hours. Take with food.    ascorbic acid (VITAMIN C) 500 mg tablet Take one tablet by mouth daily.    atorvastatin (LIPITOR) 10 mg tablet TAKE 1 TABLET BY MOUTH EVERY DAY (Patient taking differently: Take one tablet by mouth at bedtime daily.)    carvediloL (COREG) 3.125 mg tablet TAKE ONE TABLET BY MOUTH TWICE DAILY WITH MEALS. TAKE WITH FOOD.    cetirizine (ZYRTEC) 10 mg tablet Take one tablet by mouth every morning.    cholecalciferol (VITAMIN D-3) 1,000 units tablet Take one tablet by mouth daily.    cranberry conc-ascorbic acid 4,200-20 mg cap Take 2 capsules by mouth every morning.    d-mannose (AZO D-MANNOSE) 500 mg cap Take four capsules by mouth daily.    estradioL (ESTRACE) 0.01 % (0.1 mg/g) vaginal cream Insert or Apply one g to vaginal area twice weekly.    gabapentin (NEURONTIN) 100 mg capsule Take two capsules by mouth twice daily. Indications: neuropathic pain    guaiFENesin LA (MUCINEX) 600 mg tablet Take one tablet by mouth every morning.    levothyroxine (SYNTHROID) 88 mcg tablet TAKE 1 TABLET BY MOUTH EVERY DAY IN THE MORNING    losartan (COZAAR) 50 mg tablet TAKE 1 TABLET BY MOUTH TWICE A DAY    melatonin  10 mg cap Take one capsule by mouth daily.    meloxicam (MOBIC) 15 mg tablet TAKE 1 TABLET BY MOUTH EVERY DAY AS NEEDED FOR PAIN (Patient taking differently: Take one tablet by mouth every morning.)    metFORMIN (GLUCOPHAGE) 500 mg tablet Take two tablets by mouth twice daily with meals. Indications: type 2 diabetes mellitus    ONETOUCH DELICA PLUS LANCET 33 gauge USE ONE EACH AS DIRECTED DAILY AS NEEDED. DIAG CODE: DM-2 INDICATIONS: TYPE 2 DIABETES MELLITUS    ONETOUCH ULTRA TEST test strip USE ONE STRIP AS DIRECTED DAILY BEFORE BREAKFAST. DIAGNOSIS CODE: DM-2 INDICATIONS: TYPE 2 DIABETES MELLITUS    ONETOUCH ULTRA2 METER kit USE AS DIRECTED. DIAGNOSIS CODE: DM 2 INDICATIONS: TYPE 2 DIABETES MELLITUS    other medication Medication Name & Strength (List each active ingredient & strength): Emma  Dose (how much): 2  Route: by mouth Frequency (how often): daily    other medication Medication Name & Strength (List each active ingredient & strength): vitamin K2 & K7  Dose (how much): 1 Route: by mouth  Frequency (how often): daily    oxybutynin XL (DITROPAN XL) 10 mg tablet Take one tablet by mouth twice daily. Do not cut/ crush/ chew    pantoprazole DR (PROTONIX) 40 mg tablet TAKE 1 TABLET BY MOUTH TWICE A DAY    polycarbophil (FIBERCON) 625 mg tablet Take one tablet by mouth twice daily.    polyethylene glycol 3350 (MIRALAX) 17 g packet Take one packet by mouth daily as needed.    potassium chloride (KLOR-CON SPRINKLE) 10 mEq capsule Take one capsule by mouth daily as needed (with torsemide). Indications: prevention of low potassium in the blood    torsemide (DEMADEX) 20 mg tablet Take one tablet by mouth daily as needed (edema). May take 2nd daily dose if needed.  Indications: visible water retention    vit C,E-Zn-coppr-lutein-zeaxan (PRESERVISION AREDS-2) 250-200-40-1 mg-unit-mg-mg cap Take 1 capsule by mouth twice daily.    Zinc 50 mg tab Take one tablet by mouth daily.     Vitals:    04/11/23 1418   BP: (!) 193/94   BP Source: Arm, Left Upper   Pulse: 103   Temp: 36.9 ?C (98.4 ?F)   Resp: 16   SpO2: 100%   PainSc: Zero   Weight: 117.9 kg (260 lb)   Height: 157.5 cm (5' 2)     Body mass index is 47.55 kg/m?Marland Kitchen     Physical Exam  Vitals reviewed.   Constitutional:       Appearance: Normal appearance.   Cardiovascular:      Rate and Rhythm: Normal rate and regular rhythm.   Pulmonary:      Effort: Pulmonary effort is normal. No respiratory distress.      Breath sounds: Normal breath sounds. No wheezing.   Skin:     General: Skin is warm and dry.   Neurological:      Mental Status: She is alert and oriented to person, place, and time.   Psychiatric:         Mood and Affect: Mood normal.         Behavior: Behavior normal.         Thought Content: Thought content normal.         Judgment: Judgment normal.                      Assessment and Plan:    Problem   Osa (Obstructive Sleep Apnea)  Split night Pap titration dated 10/21/2017 at the [place] sleep lab showed severe obstructive sleep apnea with AHI of 105.6 events per hour successful CPAP titration 12 cmH2O using full facemask elevated frequency of.  Limb movement with index of 30.8 events per hour and nocturnal hypoxemia with oxygen desaturation nadir of 77% oxygen saturation below 88% for about 99.1 minutes    Pt set up with CPAP @ 12cm H2O on 12/04/2017.  DME: [facility] [place]     Cough        OSA (obstructive sleep apnea)  Reviewed CPAP download. 30/30 days of usage.   Wearing 4 hours or greater 930% of nights. The average use on days used was 8 hours and 2 minutes.  Residual AHI 1.3.    The median leak was 0.8 L/min. She is tolerating and benefiting from therapy.     - Will continue current settings, CPAP 12.     - Keep in contact with DME for routine supplies, need to contact us directly if unable to communicate with DME in a timely manner.     Cough  Cough is resolved  PFT's today with mild restriction, likely 2/2 lung resection and obesity     She has our contact information and was encouraged to call us with any questions or concerns.    RTC 31-90 days after getting new CPAP    Orders Placed This Encounter    AMB REFERRAL TO HOME CARE     Total time- 25 minutes

## 2023-04-09 ENCOUNTER — Encounter: Admit: 2023-04-09 | Discharge: 2023-04-09 | Payer: MEDICARE

## 2023-04-11 ENCOUNTER — Encounter: Admit: 2023-04-11 | Discharge: 2023-04-11 | Payer: MEDICARE

## 2023-04-11 ENCOUNTER — Ambulatory Visit: Admit: 2023-04-11 | Discharge: 2023-04-12 | Payer: MEDICARE

## 2023-04-11 DIAGNOSIS — R059 Cough, unspecified type: Secondary | ICD-10-CM

## 2023-04-11 DIAGNOSIS — G4733 Obstructive sleep apnea (adult) (pediatric): Secondary | ICD-10-CM

## 2023-04-11 NOTE — Assessment & Plan Note
 Cough is resolved  PFT's today with mild restriction, likely 2/2 lung resection and obesity

## 2023-04-11 NOTE — Assessment & Plan Note
 Reviewed CPAP download. 30/30 days of usage.   Wearing 4 hours or greater 930% of nights. The average use on days used was 8 hours and 2 minutes.  Residual AHI 1.3.    The median leak was 0.8 L/min. She is tolerating and benefiting from therapy.     - Will continue current settings, CPAP 12.     - Keep in contact with DME for routine supplies, need to contact us directly if unable to communicate with DME in a timely manner.

## 2023-04-11 NOTE — Patient Instructions
If you have questions or concerns, please call my nurse at [phone] or feel free to send me a mychart message

## 2023-04-14 ENCOUNTER — Encounter: Admit: 2023-04-14 | Discharge: 2023-04-14 | Payer: MEDICARE

## 2023-04-14 NOTE — Telephone Encounter
 Order for CPAP 12cm H2O placed per recommendations.   DME: [name]  Will continue to follow up.

## 2023-04-16 ENCOUNTER — Encounter: Admit: 2023-04-16 | Discharge: 2023-04-16 | Payer: MEDICARE

## 2023-04-16 NOTE — Telephone Encounter
 Rep from [facility] called and left voice mail stating that in order to process new cpap she needs LOV notes.  LOV note from 04/11/23 faxed to [facility] per right fax on 04/16/23 at 1011.  Laurie Panda RN

## 2023-04-17 ENCOUNTER — Encounter: Admit: 2023-04-17 | Discharge: 2023-04-17 | Payer: MEDICARE

## 2023-04-22 ENCOUNTER — Encounter: Admit: 2023-04-22 | Discharge: 2023-04-22 | Payer: MEDICARE

## 2023-04-22 MED ORDER — TORSEMIDE 20 MG PO TAB
ORAL_TABLET | ORAL | 3 refills | 67.50000 days | Status: AC
Start: 2023-04-22 — End: ?

## 2023-05-01 ENCOUNTER — Encounter: Admit: 2023-05-01 | Discharge: 2023-05-01 | Payer: MEDICARE

## 2023-05-02 ENCOUNTER — Encounter: Admit: 2023-05-02 | Discharge: 2023-05-02 | Payer: MEDICARE

## 2023-05-03 NOTE — Progress Notes
 changes related to her meningioma.  Her cataracts are becoming significant so we suggest recheck of her cataracts, ARMD, and her optic nerves in 4 months.          05/06/23 visit  Left temporal meningioma, s/p resection on 11/26/22; normal HVF and stable/normal OCT nerves.  Bilateral ARMD (reticular pattern): no change on OCT.  Cataracts: pt reports persistent problems driving at night.  Her glare tests shows decreased acuity to 20/1600 and 20/1250 respectively, but she 20/25 and 20/20 otherwise.  She'll continue to monitor.  4.   Right upper lid dermatochalasis/ptosis

## 2023-05-05 ENCOUNTER — Encounter: Admit: 2023-05-05 | Discharge: 2023-05-05 | Payer: MEDICARE

## 2023-05-06 ENCOUNTER — Encounter: Admit: 2023-05-06 | Discharge: 2023-05-06 | Payer: MEDICARE

## 2023-05-06 ENCOUNTER — Ambulatory Visit: Admit: 2023-05-06 | Discharge: 2023-05-07 | Payer: MEDICARE

## 2023-05-06 DIAGNOSIS — H3530 Unspecified macular degeneration: Secondary | ICD-10-CM

## 2023-05-06 DIAGNOSIS — H2513 Age-related nuclear cataract, bilateral: Secondary | ICD-10-CM

## 2023-05-06 DIAGNOSIS — D496 Neoplasm of unspecified behavior of brain: Secondary | ICD-10-CM

## 2023-05-06 DIAGNOSIS — Z9889 Other specified postprocedural states: Secondary | ICD-10-CM

## 2023-05-20 ENCOUNTER — Encounter: Admit: 2023-05-20 | Discharge: 2023-05-20 | Payer: MEDICARE

## 2023-05-20 NOTE — Telephone Encounter
 An encounter has been created for documentation only (often for preparation of an upcoming appointment or for follow up on orders/imaging) and patient does not need contact RN and did not miss a phone call or appointment.     RN prepped patient information for clinic with Christie Beckers APRN NP on 07/16/22 at 1330 via in person appt.     Here for compliance  follow up 06/01/23 to 07/30/23   Dx:OSA  Pt known to sleep department by Dr. Andria Meuse    Last SS: 10/21/17 with AHI 105.6  Pt set up with CPAP @ 12cm H2O on 05/01/23.      LV: 04/11/23 with Christie Beckers   Meds:melatonin    Craniotomy done on 11/26/22    PFT last done 04/11/23    ZOX:[id number]  RN obtained download from [facility]

## 2023-05-22 ENCOUNTER — Encounter: Admit: 2023-05-22 | Discharge: 2023-05-22 | Payer: MEDICARE

## 2023-05-22 MED ORDER — OXYBUTYNIN CHLORIDE 10 MG PO TR24
10 mg | ORAL_TABLET | Freq: Two times a day (BID) | ORAL | 0 refills | 12.00000 days | Status: AC
Start: 2023-05-22 — End: ?

## 2023-05-25 ENCOUNTER — Encounter: Admit: 2023-05-25 | Discharge: 2023-05-25 | Payer: MEDICARE

## 2023-06-01 ENCOUNTER — Encounter: Admit: 2023-06-01 | Discharge: 2023-06-01

## 2023-06-03 ENCOUNTER — Encounter: Admit: 2023-06-03 | Discharge: 2023-06-03

## 2023-06-03 ENCOUNTER — Ambulatory Visit: Admit: 2023-06-03 | Discharge: 2023-06-03

## 2023-06-05 ENCOUNTER — Encounter: Admit: 2023-06-05 | Discharge: 2023-06-05

## 2023-06-13 ENCOUNTER — Encounter: Admit: 2023-06-13 | Discharge: 2023-06-13 | Payer: MEDICARE

## 2023-06-16 ENCOUNTER — Encounter: Admit: 2023-06-16 | Discharge: 2023-06-16 | Payer: MEDICARE

## 2023-06-18 ENCOUNTER — Ambulatory Visit: Admit: 2023-06-18 | Discharge: 2023-06-18 | Payer: MEDICARE

## 2023-06-18 ENCOUNTER — Encounter: Admit: 2023-06-18 | Discharge: 2023-06-18 | Payer: MEDICARE

## 2023-06-18 DIAGNOSIS — M65342 Trigger finger, left ring finger: Secondary | ICD-10-CM

## 2023-06-18 DIAGNOSIS — I10 Essential (primary) hypertension: Secondary | ICD-10-CM

## 2023-06-18 DIAGNOSIS — Z85110 Personal history of malignant carcinoid tumor of bronchus and lung: Secondary | ICD-10-CM

## 2023-06-18 DIAGNOSIS — I110 Hypertensive heart disease with heart failure: Secondary | ICD-10-CM

## 2023-06-18 DIAGNOSIS — F331 Major depressive disorder, recurrent, moderate: Secondary | ICD-10-CM

## 2023-06-18 DIAGNOSIS — E119 Type 2 diabetes mellitus without complications: Secondary | ICD-10-CM

## 2023-06-18 NOTE — Progress Notes
 Chief Complaint   Patient presents with    Blood Pressure Check     Brought machine to check and log.    Diabetes     Brought log- how long will this need to be done? As high as 200    Pain     Left ring finger- trouble lifting finger, low flexibility  Drink vinegar and lemon juice? BID. Has been adding water. Appetite lower.          Subjective:       Patient Reported Other  What topic(s) would you like to cover during your appointment?:  Diabetes testing  Test my current bp machine  Please describe the issue(s) and history with the issue (location, severity, duration, symptoms, etc.).:  Do I need to continue testing for diabetes   Concern some of meds are not interacting well  What has been done so far to take care of the issue(s)?:  Have tested everyday for sugar rating  What are your goals for this visit?:  Look over last blood work    Alicia Fox is a 73 y.o. female.  Patient presents to clinic with multiple concerns.  She is here for blood pressure check and has been monitoring her blood pressure at home.  Overall it has been controlled.  However, when she has a lot to do or get stressed it does increase some.  She follows with cardiology as well.  Patient also has been monitoring her blood sugars daily and wonders how long she needs to do this.  Her A1c has been well-controlled.  She has noticed that her left fourth digit seems to get stuck at times and is starting to become more frequent.  Patient also has been following with neurosurgery for her most recent tumor removal and is recuperating from this.          Review of Systems   Constitutional: Positive for malaise/fatigue. Negative for fever.   Eyes:  Negative for blurred vision.   Cardiovascular:  Positive for leg swelling. Negative for chest pain.   Respiratory:  Positive for cough and shortness of breath.    Musculoskeletal:  Positive for muscle weakness and myalgias.   Gastrointestinal:  Negative for abdominal pain.   Neurological:  Positive for headaches and loss of balance.   Psychiatric/Behavioral:  Positive for depression.           Objective:          acetaminophen (TYLENOL) 325 mg tablet Take two tablets by mouth every 6 hours as needed. For fever or pain.    ascorbic acid (VITAMIN C) 500 mg tablet Take one tablet by mouth daily.    atorvastatin (LIPITOR) 10 mg tablet TAKE 1 TABLET BY MOUTH EVERY DAY (Patient taking differently: Take one tablet by mouth at bedtime daily.)    carvediloL (COREG) 3.125 mg tablet TAKE ONE TABLET BY MOUTH TWICE DAILY WITH MEALS. TAKE WITH FOOD.    cetirizine (ZYRTEC) 10 mg tablet Take one tablet by mouth every morning.    cholecalciferol (VITAMIN D-3) 1,000 units tablet Take one tablet by mouth daily.    clobetasoL (TEMOVATE) 0.05 % topical cream Apply 1 inch topically to affected area twice daily. Apply to affected area    cranberry conc-ascorbic acid 4,200-20 mg cap Take 2 capsules by mouth every morning.    d-mannose (AZO D-MANNOSE) 500 mg cap Take four capsules by mouth daily.    estradioL (ESTRACE) 0.01 % (0.1 mg/g) vaginal cream Insert or Apply one  g to vaginal area twice weekly.    gabapentin  (NEURONTIN ) 100 mg capsule Take two capsules by mouth twice daily. Indications: neuropathic pain    guaiFENesin  LA (MUCINEX ) 600 mg tablet Take one tablet by mouth every morning.    levothyroxine  (SYNTHROID ) 88 mcg tablet TAKE 1 TABLET BY MOUTH EVERY DAY IN THE MORNING    losartan  (COZAAR ) 50 mg tablet TAKE 1 TABLET BY MOUTH TWICE A DAY    melatonin 10 mg cap Take one capsule by mouth daily.    meloxicam  (MOBIC ) 15 mg tablet TAKE 1 TABLET BY MOUTH EVERY DAY AS NEEDED FOR PAIN    metFORMIN  (GLUCOPHAGE ) 500 mg tablet Take two tablets by mouth twice daily with meals. Indications: type 2 diabetes mellitus    ONETOUCH DELICA PLUS LANCET 33 gauge USE ONE EACH AS DIRECTED DAILY AS NEEDED. DIAG CODE: DM-2 INDICATIONS: TYPE 2 DIABETES MELLITUS    ONETOUCH ULTRA TEST test strip USE ONE STRIP AS DIRECTED DAILY BEFORE BREAKFAST. DIAGNOSIS CODE: DM-2 INDICATIONS: TYPE 2 DIABETES MELLITUS    ONETOUCH ULTRA2 METER kit USE AS DIRECTED. DIAGNOSIS CODE: DM 2 INDICATIONS: TYPE 2 DIABETES MELLITUS    other medication Medication Name & Strength (List each active ingredient & strength): Emma  Dose (how much): 2  Route: by mouth Frequency (how often): daily    other medication Medication Name & Strength (List each active ingredient & strength): vitamin K2 & K7  Dose (how much): 1 Route: by mouth  Frequency (how often): daily    oxybutynin  XL (DITROPAN  XL) 10 mg tablet Take one tablet by mouth twice daily. Do not cut/ crush/ chew    pantoprazole  DR (PROTONIX ) 40 mg tablet TAKE 1 TABLET BY MOUTH TWICE A DAY    polycarbophil (FIBERCON) 625 mg tablet Take one tablet by mouth twice daily.    polyethylene glycol 3350  (MIRALAX ) 17 g packet Take one packet by mouth daily as needed.    potassium chloride  (KLOR-CON  SPRINKLE) 10 mEq capsule Take one capsule by mouth daily as needed (with torsemide ). Indications: prevention of low potassium in the blood    torsemide  (DEMADEX ) 20 mg tablet TAKE ONE TABLET BY MOUTH DAILY. INDICATIONS: VISIBLE WATER  RETENTION    vit C,E-Zn-coppr-lutein-zeaxan (PRESERVISION AREDS-2) 250-200-40-1 mg-unit-mg-mg cap Take 1 capsule by mouth twice daily.    Zinc  50 mg tab Take one tablet by mouth daily.     Vitals:    06/18/23 1034 06/18/23 1044   BP: (!) 151/85  Comment: pt machine (!) 150/80   BP Source: Arm, Left Upper Arm, Left Upper   Pulse: 85    Temp: 37.3 ?C (99.2 ?F)    SpO2: 97%    TempSrc: Temporal    PainSc: Zero    Weight: 115.9 kg (255 lb 9.6 oz)      Body mass index is 46.75 kg/m?Alicia Fox       Physical Exam  General: NAD, A& O x3  CV: RRR no murmur  Pulm: CTA BIL  Ext: no c/c, 1+ lower extremity edema bilaterally  MS: Left fourth digit with trigger finger and palpable calcified tendon  Psych: Normal mood and affect       Assessment and Plan:  1. Primary hypertension (Primary)  Controlled at home.  Mildly elevated today in the clinic.  Continue current meds    2. Type 2 diabetes mellitus without complication, without long-term current use of insulin  (CMS-HCC)  Controlled.  Continue current meds.  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  Patient follows with cardiology    4. Moderate episode of recurrent major depressive disorder (CMS-HCC)  Encourage patient to become more active at home.  She is planning to get out more now that she is using a cane.    5. Morbid (severe) obesity (CMS-HCC)  BMI is 46.  Patient is limited in her ability to exercise.    6. 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  Patient follows with pulmonary regularly    7. Trigger ring finger of left hand  X-ray today and refer for hand surgeon evaluation  - AMB REFERRAL TO HAND SURGEON  - HAND MIN 3 VIEWS LEFT; Future    8. Hypercalcemia  With elevated alk phos and hypercalcemia, concern for hyperparathyroidism.  Will check labs to further evaluate.  Patient currently is asymptomatic.  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       At least 50% of visit was spent in counseling and coordination of care.

## 2023-06-18 NOTE — Progress Notes
 Alicia Fox returns to the clinic for a FUV.   A repeat MRI of the head was done today.     Her past medical history is as follow:  History of heart failure, lung cancer, breast cancer, and uterine cancer.    The breast and uterine cancer history is very remote.  She underwent local resection without any chemo or radiation therapy.    She was diagnosed with lung cancer 5 years ago treated with resection without adjuvant chemoradiation therapy.    She was referred to ophthalmology for visual symptoms and she is being managed for macular degeneration.    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 Ansel Bass and obtaining an MRI brain.       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  Her MRI in 2024 showed a significant increase in the size of the tumor and new surrounding edema.     She underwent a Left frontotemporal craniotomy, resection of middle cranial fossa meningioma on 11/26/2022.   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      Clinically she is doing very well overall  No new neurologic symptom    I reviewed her MRI, I personally see no evidence of recurrence     A/P:  The plan is to watch her with serial imaging  I will see her back in a year with a follow-up MRI

## 2023-06-20 ENCOUNTER — Encounter: Admit: 2023-06-20 | Discharge: 2023-06-20 | Payer: MEDICARE

## 2023-06-21 ENCOUNTER — Encounter: Admit: 2023-06-21 | Discharge: 2023-06-21 | Payer: MEDICARE

## 2023-06-22 ENCOUNTER — Encounter: Admit: 2023-06-22 | Discharge: 2023-06-22 | Payer: MEDICARE

## 2023-06-22 MED ORDER — ATORVASTATIN 10 MG PO TAB
10 mg | ORAL_TABLET | Freq: Every evening | ORAL | 3 refills | 90.00000 days | Status: AC
Start: 2023-06-22 — End: ?

## 2023-06-23 ENCOUNTER — Encounter: Admit: 2023-06-23 | Discharge: 2023-06-23 | Payer: MEDICARE

## 2023-06-26 ENCOUNTER — Encounter: Admit: 2023-06-26 | Discharge: 2023-06-26 | Payer: MEDICARE

## 2023-06-27 ENCOUNTER — Encounter: Admit: 2023-06-27 | Discharge: 2023-06-27 | Payer: MEDICARE

## 2023-06-30 ENCOUNTER — Encounter: Admit: 2023-06-30 | Discharge: 2023-06-30 | Payer: MEDICARE

## 2023-06-30 ENCOUNTER — Ambulatory Visit: Admit: 2023-06-30 | Discharge: 2023-07-01 | Payer: MEDICARE

## 2023-06-30 DIAGNOSIS — M65341 Trigger finger, right ring finger: Secondary | ICD-10-CM

## 2023-06-30 MED ORDER — TRIAMCINOLONE ACETONIDE 40 MG/ML IJ SUSP
40 mg | Freq: Once | INTRAMUSCULAR | 0 refills | Status: CP | PRN
Start: 2023-06-30 — End: ?

## 2023-06-30 MED ORDER — NAPROXEN 500 MG PO TAB
500 mg | ORAL_TABLET | Freq: Two times a day (BID) | ORAL | 0 refills | 30.00000 days | Status: AC
Start: 2023-06-30 — End: ?

## 2023-06-30 MED ORDER — LIDOCAINE (PF) 10 MG/ML (1 %) IJ SOLN
1 mL | Freq: Once | INTRAMUSCULAR | 0 refills | Status: CP | PRN
Start: 2023-06-30 — End: ?

## 2023-06-30 NOTE — Patient Instructions
 It was a pleasure seeing you today. Please contact us if you have any further questions, we are happy to assist.     For nursing/medical/casting or splinting questions, please send a message through MyChart. This is our preferred method of contact and the most efficient way to contact your healthcare team. If you do not have internet access/smartphone you can call my Clinical Nurse Coordinator at [phone]. Please do not leave multiple voicemail's for [place]. Leaving multiple voicemail's delays [place] getting back to you quicker.    NOTE: MyChart messages and phone calls received on weekends, on holidays, and after 4 pm on weekdays will NOT be seen until the following business day.     - For medication refills, please ask your pharmacy to send an electronic request.  Please allow at least 3 business days for medication refills.    - To cancel, change, or schedule a clinic appointment: Call  Scheduling at [phone].  - For any radiology concerns or scheduling, please call [phone].     Any insurance, disability or Family Medical Leave Act [facility]) forms can be faxed to [phone].  Be sure to include your name and date of birth on the form. We will complete and fax the forms where they need to go as soon as we are able. For any questions about disability or FMLA paperwork, please contact Dr. Darden Dates administrative assistant, Karna Christmas at [phone].    Do not hesitate to contact our office with any further questions.Thank you and have a great day!    Philomena Course, MD   [facility]   Orthopedic & Sports Medicine  Office: [phone]  Fax: [phone]

## 2023-06-30 NOTE — Progress Notes
Tendon Injection  Location: Ring Finger - L ring flexor sheath    Consent:   Consent obtained: verbal  Consent given by: patient  Risks discussed: Bleeding, Damage to surrounding structures, Infection and Pain  Alternatives discussed: delayed treatment, alternative treatment, no treatment and referral     Universal Protocol:  Relevant documents: relevant documents present and verified  Patient identity confirmed: Patient identify confirmed verbally with patient.          Procedures Details:        JOINT LOCATION: Ring Finger  SITE: L ring flexor       Indications: pain   Prep: alcohol  Approach: volar  Medications administered: 40 mg triamcinolone acetonide 40 mg/mL; 1 mL lidocaine PF 1% (10 mg/mL)  Patient tolerance: Patient tolerated the procedure well with no immediate complications. Pressure was applied, and hemostasis was accomplished.

## 2023-06-30 NOTE — Progress Notes
 [facility] Hand Surgery      Date of Service: 06/30/2023    Subjective:           Bilateral ring finger clicking    History of Present Illness  This is a 73 year old female presenting with a several week history of primarily left ring finger catching and locking but similar symptoms on the right side.  She reports frequent catching and locking which can feel like a rubber band.  This is associated with some soreness which can radiate up her forearms.  She denies any sensory disturbances.  She does have a history of a previous left carpal tunnel release.  Alicia Fox is a 74 y.o. female.     Review of Systems      Objective:          acetaminophen (TYLENOL) 325 mg tablet Take two tablets by mouth every 6 hours as needed. For fever or pain.    ascorbic acid (VITAMIN C) 500 mg tablet Take one tablet by mouth daily.    atorvastatin (LIPITOR) 10 mg tablet Take one tablet by mouth at bedtime daily.    carvediloL (COREG) 3.125 mg tablet TAKE ONE TABLET BY MOUTH TWICE DAILY WITH MEALS. TAKE WITH FOOD.    cetirizine (ZYRTEC) 10 mg tablet Take one tablet by mouth every morning.    cholecalciferol (VITAMIN D-3) 1,000 units tablet Take one tablet by mouth daily.    clobetasoL (TEMOVATE) 0.05 % topical cream Apply 1 inch topically to affected area twice daily. Apply to affected area    cranberry conc-ascorbic acid 4,200-20 mg cap Take 2 capsules by mouth every morning.    d-mannose (AZO D-MANNOSE) 500 mg cap Take four capsules by mouth daily.    estradioL (ESTRACE) 0.01 % (0.1 mg/g) vaginal cream Insert or Apply one g to vaginal area twice weekly.    gabapentin (NEURONTIN) 100 mg capsule Take two capsules by mouth twice daily. Indications: neuropathic pain    guaiFENesin LA (MUCINEX) 600 mg tablet Take one tablet by mouth every morning.    levothyroxine (SYNTHROID) 88 mcg tablet TAKE 1 TABLET BY MOUTH EVERY DAY IN THE MORNING    losartan (COZAAR) 50 mg tablet TAKE 1 TABLET BY MOUTH TWICE A DAY    melatonin 10 mg cap Take one capsule by mouth daily.    meloxicam (MOBIC) 15 mg tablet TAKE 1 TABLET BY MOUTH EVERY DAY AS NEEDED FOR PAIN    metFORMIN (GLUCOPHAGE) 500 mg tablet Take two tablets by mouth twice daily with meals. Indications: type 2 diabetes mellitus    ONETOUCH DELICA PLUS LANCET 33 gauge USE ONE EACH AS DIRECTED DAILY AS NEEDED. DIAG CODE: DM-2 INDICATIONS: TYPE 2 DIABETES MELLITUS    ONETOUCH ULTRA TEST test strip USE ONE STRIP AS DIRECTED DAILY BEFORE BREAKFAST. DIAGNOSIS CODE: DM-2 INDICATIONS: TYPE 2 DIABETES MELLITUS    ONETOUCH ULTRA2 METER kit USE AS DIRECTED. DIAGNOSIS CODE: DM 2 INDICATIONS: TYPE 2 DIABETES MELLITUS    other medication Medication Name & Strength (List each active ingredient & strength): Emma  Dose (how much): 2  Route: by mouth Frequency (how often): daily    other medication Medication Name & Strength (List each active ingredient & strength): vitamin K2 & K7  Dose (how much): 1 Route: by mouth  Frequency (how often): daily    oxybutynin XL (DITROPAN XL) 10 mg tablet Take one tablet by mouth twice daily. Do not cut/ crush/ chew    pantoprazole DR (  PROTONIX) 40 mg tablet TAKE 1 TABLET BY MOUTH TWICE A DAY    polycarbophil (FIBERCON) 625 mg tablet Take one tablet by mouth twice daily.    polyethylene glycol 3350 (MIRALAX) 17 g packet Take one packet by mouth daily as needed.    potassium chloride (KLOR-CON SPRINKLE) 10 mEq capsule Take one capsule by mouth daily as needed (with torsemide). Indications: prevention of low potassium in the blood    torsemide (DEMADEX) 20 mg tablet TAKE ONE TABLET BY MOUTH DAILY. INDICATIONS: VISIBLE WATER RETENTION    vit C,E-Zn-coppr-lutein-zeaxan (PRESERVISION AREDS-2) 250-200-40-1 mg-unit-mg-mg cap Take 1 capsule by mouth twice daily.    Zinc 50 mg tab Take one tablet by mouth daily.     Vitals:    06/30/23 1125   PainSc: One     There is no height or weight on file to calculate BMI.     Physical Exam  Constitutional: General: She is not in acute distress.     Appearance: Normal appearance.   Neurological:      Mental Status: She is oriented to person, place, and time.   Psychiatric:         Behavior: Behavior normal.       Ortho Exam  Bilateral hands: The skin is intact, there is a well-healed scar over her left carpal tunnel consistent with her history.  She has full range of motion of the wrist and digits.  There is some tenderness to palpation over the A1 pulleys of her bilateral ring fingers as well as when she makes a full fist I am able to palpate a subtle click over both A1 pulleys.  She has intact sensation and brisk cap refill to all digits.       Assessment and Plan:  Her symptoms and examination are consistent with bilateral ring finger trigger digits.  I would recommend trying some conservative care with steroid injections as well as some oral anti-inflammatories.  I encouraged follow-up in a month if she is not getting better.    Tendon Injection  Location: Ring Finger - R ring flexor sheath    Consent:   Consent obtained: verbal  Consent given by: patient  Risks discussed: Bleeding, Damage to surrounding structures, Infection and Pain  Alternatives discussed: delayed treatment, alternative treatment, no treatment and referral     Universal Protocol:  Relevant documents: relevant documents present and verified  Patient identity confirmed: Patient identify confirmed verbally with patient.          Procedures Details:        JOINT LOCATION: Ring Finger  SITE: R ring flexor       Indications: pain   Prep: alcohol  Approach: volar  Medications administered: 40 mg triamcinolone acetonide 40 mg/mL; 1 mL lidocaine PF 1% (10 mg/mL)  Patient tolerance: Patient tolerated the procedure well with no immediate complications. Pressure was applied, and hemostasis was accomplished.                                               Belinda Bowler, MD  Associate Professor  Orthopedic Hand Surgery

## 2023-07-01 DIAGNOSIS — M65342 Trigger finger, left ring finger: Secondary | ICD-10-CM

## 2023-07-02 ENCOUNTER — Encounter: Admit: 2023-07-02 | Discharge: 2023-07-02 | Payer: MEDICARE

## 2023-07-02 ENCOUNTER — Ambulatory Visit: Admit: 2023-07-02 | Discharge: 2023-07-03 | Payer: MEDICARE

## 2023-07-03 ENCOUNTER — Encounter: Admit: 2023-07-03 | Discharge: 2023-07-03 | Payer: MEDICARE

## 2023-07-03 ENCOUNTER — Ambulatory Visit: Admit: 2023-07-03 | Discharge: 2023-07-03 | Payer: MEDICARE

## 2023-07-03 DIAGNOSIS — D329 Benign neoplasm of meninges, unspecified: Secondary | ICD-10-CM

## 2023-07-03 MED ADMIN — GADOBENATE DIMEGLUMINE 529 MG/ML (0.1MMOL/0.2ML) IV SOLN [135881]: 20 mL | INTRAVENOUS | @ 16:00:00 | Stop: 2023-07-03 | NDC 00270516415

## 2023-07-03 MED ADMIN — SODIUM CHLORIDE 0.9 % IJ SOLN [7319]: 50 mL | INTRAVENOUS | @ 16:00:00 | Stop: 2023-07-03 | NDC 00409488820

## 2023-07-03 MED ADMIN — IOHEXOL 350 MG IODINE/ML IV SOLN [81210]: 100 mL | INTRAVENOUS | @ 16:00:00 | Stop: 2023-07-03 | NDC 00407141491

## 2023-07-03 NOTE — Patient Instructions
 It was a pleasure seeing you in clinic today! Dr. Francis Gaines would like to see you back in clinic in 1 year. Prior to your appointment, he would like updated imaging: a MRI. Our scheduling team should contact you to schedule your appointments.     If you have any questions or concerns, please call [phone] or send a message via Mychart.     Useful Numbers:   [phone]: Hospital Operator  [phone]: Neurosurgery Clinic Number   [phone]: Radiology   [phone]: MyChart Help Desk     Thank you!     Clemon Chambers., RN, BSN  Ambulatory Care Coordinator  Dr. Gwynn Burly: [phone]  F: [phone]  Department of Neurosurgery   [facility]

## 2023-07-03 NOTE — Progress Notes
 Telehealth Visit:   Date of Service: 07/04/2023       Subjective:             Alicia Fox is a 73 y.o. female.      History of Present Illness    Alicia Fox presents to thoracic surgery clinic for follow up CT scan of her chest for 12 month cancer surveillance. She has a history of uterine and breast cancer. She presented to thoracic surgery for evaluation of biopsy-proven left lower lobe carcinoid.  On 03/21/17 she underwent a Robotic assisted left lower lobectomy under the direction of Dr. Laverne Potter.  Pathology impression:  Lung: Typical carcinoid tumor.   Greatest dimension (centimeters): 3.0 cm. Additional dimensions (centimeters): 2.5 x 2.5 cm.  Pathologic Stage Classification (pTNM, AJCC 8th Edition): pT1c N0      Interval changes-   11/2022   She underwent a Left frontotemporal craniotomy, resection of middle cranial fossa meningioma on 11/26/2022.   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     Trigger finger injections.     Mac degeneration management.     Patient doing well from a breathing standpoint, she's had several changes since last visit. She feels somewhat depressed at times at all she has been through but she is grateful. She is having some memory/word finding trouble since meningioma resection last Fall. She lost a lot of her hair. She did not need radiation post operatively. She follows chamoun here at [facility]. She's had several UTIs previously since last visit. Expected great grand child in August.     CT chest abdomen pelvis   Left lower lobectomy. No locally recurrent tumor or thoracic metastatic   disease.     ABDOMEN AND PELVIS:   1.  No abdominopelvic metastatic disease.     2.  Cholelithiasis and small nonobstructing left renal stone.            Review of Systems   Constitutional: Negative.   HENT: Negative.     Eyes: Negative.    Cardiovascular: Negative.    Respiratory: Negative.     Endocrine: Negative.    Hematologic/Lymphatic: Negative.    Skin: Negative. Musculoskeletal: Negative.    Gastrointestinal: Negative.    Genitourinary: Negative.    Neurological: Negative.    Psychiatric/Behavioral: Negative.     Allergic/Immunologic: Negative.    All other systems reviewed and are negative.        Objective:          acetaminophen (TYLENOL) 325 mg tablet Take two tablets by mouth every 6 hours as needed. For fever or pain.    ascorbic acid (vitamin C) 500 mg tablet Take one tablet by mouth twice daily.    atorvastatin (LIPITOR) 10 mg tablet Take one tablet by mouth at bedtime daily.    carvediloL (COREG) 3.125 mg tablet TAKE ONE TABLET BY MOUTH TWICE DAILY WITH MEALS. TAKE WITH FOOD.    cetirizine (ZYRTEC) 10 mg tablet Take one tablet by mouth every morning.    cholecalciferol (VITAMIN D-3) 1,000 units tablet Take one tablet by mouth daily.    clobetasoL (TEMOVATE) 0.05 % topical cream Apply 1 inch topically to affected area twice daily. Apply to affected area    Cranberry 500 mg cap Take one capsule by mouth daily.    cranberry conc-ascorbic acid 4,200-20 mg cap Take 2 capsules by mouth every morning.    estradioL (ESTRACE) 0.01 % (0.1 mg/g) vaginal cream Insert or Apply one g to  vaginal area twice weekly.    gabapentin (NEURONTIN) 100 mg capsule Take two capsules by mouth twice daily. Indications: neuropathic pain    guaiFENesin LA (MUCINEX) 600 mg tablet Take one tablet by mouth every morning.    levothyroxine (SYNTHROID) 88 mcg tablet TAKE 1 TABLET BY MOUTH EVERY DAY IN THE MORNING    losartan (COZAAR) 50 mg tablet TAKE 1 TABLET BY MOUTH TWICE A DAY    MAGNESIUM PO Take  by mouth.    melatonin 10 mg cap Take one capsule by mouth daily.    meloxicam (MOBIC) 15 mg tablet TAKE 1 TABLET BY MOUTH EVERY DAY AS NEEDED FOR PAIN    metFORMIN (GLUCOPHAGE) 500 mg tablet Take two tablets by mouth twice daily with meals. Indications: type 2 diabetes mellitus    naproxen (NAPROSYN) 500 mg tablet Take one tablet by mouth twice daily with meals for 30 days. Take with food. ONETOUCH DELICA PLUS LANCET 33 gauge USE ONE EACH AS DIRECTED DAILY AS NEEDED. DIAG CODE: DM-2 INDICATIONS: TYPE 2 DIABETES MELLITUS    ONETOUCH ULTRA TEST test strip USE ONE STRIP AS DIRECTED DAILY BEFORE BREAKFAST. DIAGNOSIS CODE: DM-2 INDICATIONS: TYPE 2 DIABETES MELLITUS    ONETOUCH ULTRA2 METER kit USE AS DIRECTED. DIAGNOSIS CODE: DM 2 INDICATIONS: TYPE 2 DIABETES MELLITUS    other medication Medication Name & Strength (List each active ingredient & strength): Emma  Dose (how much): 2  Route: by mouth Frequency (how often): daily    other medication Medication Name & Strength (List each active ingredient & strength): vitamin K2 & K7  Dose (how much): 1 Route: by mouth  Frequency (how often): daily    pantoprazole DR (PROTONIX) 40 mg tablet TAKE 1 TABLET BY MOUTH TWICE A DAY    polycarbophil (FIBERCON) 625 mg tablet Take one tablet by mouth twice daily.    polyethylene glycol 3350 (MIRALAX) 17 g packet Take one packet by mouth daily as needed.    potassium chloride (KLOR-CON SPRINKLE) 10 mEq capsule Take one capsule by mouth daily as needed (with torsemide). Indications: prevention of low potassium in the blood    torsemide (DEMADEX) 20 mg tablet TAKE ONE TABLET BY MOUTH DAILY. INDICATIONS: VISIBLE WATER RETENTION    trospium (SANCTURA) 20 mg tablet Take one tablet by mouth twice daily. Take with water on an empty stomach, at least 1 hour before a meal.    vit C,E-Zn-coppr-lutein-zeaxan (PRESERVISION AREDS-2) 250-200-40-1 mg-unit-mg-mg cap Take 1 capsule by mouth twice daily.    Zinc 50 mg tab Take one tablet by mouth daily.     There were no vitals filed for this visit.  There is no height or weight on file to calculate BMI.     Physical Exam  Constitutional:       Appearance: Normal appearance.   HENT:      Head: Normocephalic.      Right Ear: External ear normal.      Left Ear: External ear normal.      Nose: Nose normal.   Eyes:      Conjunctiva/sclera: Conjunctivae normal.   Pulmonary:      Effort: Pulmonary effort is normal.   Musculoskeletal:      Cervical back: Neck supple.   Neurological:      General: No focal deficit present.      Mental Status: She is alert and oriented to person, place, and time.   Psychiatric:         Mood and Affect: Mood normal.  Behavior: Behavior normal.         Thought Content: Thought content normal.         Judgment: Judgment normal.              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 Encounter for follow-up surveillance of malignant carcinoid tumor  CT CHEST WO CONTRAST        No evidence of recurrence. RTC 1 yr for ongoing surveillance. Can time with MRI for meningioma surveillance with Ch[facility].       Tomasita Fox, APRN  Cardiothoracic Surgery

## 2023-07-04 ENCOUNTER — Encounter: Admit: 2023-07-04 | Discharge: 2023-07-04 | Payer: MEDICARE

## 2023-07-04 ENCOUNTER — Ambulatory Visit: Admit: 2023-07-04 | Discharge: 2023-07-05 | Payer: MEDICARE

## 2023-07-04 DIAGNOSIS — D3A090 Benign carcinoid tumor of the bronchus and lung: Secondary | ICD-10-CM

## 2023-07-04 DIAGNOSIS — Z08 Encounter for follow-up examination after completed treatment for malignant neoplasm: Secondary | ICD-10-CM

## 2023-07-15 NOTE — Progress Notes
 Date of Service: 07/16/2023    Subjective:             Alicia Fox is a 73 y.o. female.    History of Present Illness  I had the pleasure of seeing Alicia Fox in clinic for OSA follow-up. She is known to Dr. Florie Husband. This is a CPAP compliance visit. 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   She is wearing a full face mask.   Pressure is comfortable.    Getting supplies ok from DME   Denies shortness of breath and chest pain at night     Goes to be late, after midnight, up by 0900     Machine has smart start on and smart off on. Turns on automatically when she takes mask off for the day, 20-30 minutes later like clockwork.     Still frustrated with her slow recovery from meningioma removal        07/12/2023     5:48 PM 04/07/2023     3:08 PM 04/02/2023     2:59 PM 01/16/2023    11:47 AM   Epworth Sleepiness Scale   Sitting and reading 1 2 2 2    Watching TV 1 1 1 2    Sitting inactive in a public place (e.g. a theater or a meeting) 0 0 0 0   As a passenger in a car for an hour without a break 0 0 0 0   Lying down to rest in the afternoon when circumstances permit 2 3 3 3    Sitting and talking to someone 0 0 0 0   Sitting quietly after a lunch without alcohol 2 2 2 1    In a car, while stopped for a few minutes in traffic 0 0 0 0   Epworth Sleepiness Scale Score 6  8 8  8         Patient-reported                   Objective:         acetaminophen (TYLENOL) 325 mg tablet Take two tablets by mouth every 6 hours as needed. For fever or pain.    ascorbic acid (vitamin C) 500 mg tablet Take one tablet by mouth twice daily.    atorvastatin (LIPITOR) 10 mg tablet Take one tablet by mouth at bedtime daily.    carvediloL (COREG) 3.125 mg tablet TAKE ONE TABLET BY MOUTH TWICE DAILY WITH MEALS. TAKE WITH FOOD.    cetirizine (ZYRTEC) 10 mg tablet Take one tablet by mouth every morning.    cholecalciferol (VITAMIN D-3) 1,000 units tablet Take one tablet by mouth daily.    clobetasoL (TEMOVATE) 0.05 % topical cream Apply 1 inch topically to affected area twice daily. Apply to affected area    Cranberry 500 mg cap Take one capsule by mouth daily.    cranberry conc-ascorbic acid 4,200-20 mg cap Take 2 capsules by mouth every morning.    estradioL (ESTRACE) 0.01 % (0.1 mg/g) vaginal cream Insert or Apply one g to vaginal area twice weekly.    gabapentin (NEURONTIN) 100 mg capsule Take two capsules by mouth twice daily. Indications: neuropathic pain    guaiFENesin LA (MUCINEX) 600 mg tablet Take one tablet by mouth every morning.    levothyroxine (SYNTHROID) 88 mcg tablet TAKE 1 TABLET BY MOUTH EVERY DAY IN THE MORNING    losartan (COZAAR) 50 mg tablet TAKE 1 TABLET BY MOUTH TWICE A DAY  MAGNESIUM PO Take  by mouth.    melatonin 10 mg cap Take one capsule by mouth daily.    meloxicam (MOBIC) 15 mg tablet TAKE 1 TABLET BY MOUTH EVERY DAY AS NEEDED FOR PAIN    metFORMIN (GLUCOPHAGE) 500 mg tablet Take two tablets by mouth twice daily with meals. Indications: type 2 diabetes mellitus    naproxen (NAPROSYN) 500 mg tablet Take one tablet by mouth twice daily with meals for 30 days. Take with food.    ONETOUCH DELICA PLUS LANCET 33 gauge USE ONE EACH AS DIRECTED DAILY AS NEEDED. DIAG CODE: DM-2 INDICATIONS: TYPE 2 DIABETES MELLITUS    ONETOUCH ULTRA TEST test strip USE ONE STRIP AS DIRECTED DAILY BEFORE BREAKFAST. DIAGNOSIS CODE: DM-2 INDICATIONS: TYPE 2 DIABETES MELLITUS    ONETOUCH ULTRA2 METER kit USE AS DIRECTED. DIAGNOSIS CODE: DM 2 INDICATIONS: TYPE 2 DIABETES MELLITUS    other medication Medication Name & Strength (List each active ingredient & strength): Alicia Fox  Dose (how much): 2  Route: by mouth Frequency (how often): daily    other medication Medication Name & Strength (List each active ingredient & strength): vitamin K2 & K7  Dose (how much): 1 Route: by mouth  Frequency (how often): daily    pantoprazole DR (PROTONIX) 40 mg tablet TAKE 1 TABLET BY MOUTH TWICE A DAY    polycarbophil (FIBERCON) 625 mg tablet Take one tablet by mouth twice daily.    polyethylene glycol 3350 (MIRALAX) 17 g packet Take one packet by mouth daily as needed.    potassium chloride (KLOR-CON SPRINKLE) 10 mEq capsule Take one capsule by mouth daily as needed (with torsemide). Indications: prevention of low potassium in the blood    torsemide (DEMADEX) 20 mg tablet TAKE ONE TABLET BY MOUTH DAILY. INDICATIONS: VISIBLE WATER RETENTION    trospium (SANCTURA) 20 mg tablet Take one tablet by mouth twice daily. Take with water on an empty stomach, at least 1 hour before a meal.    vit C,E-Zn-coppr-lutein-zeaxan (PRESERVISION AREDS-2) 250-200-40-1 mg-unit-mg-mg cap Take 1 capsule by mouth twice daily.    Zinc 50 mg tab Take one tablet by mouth daily.     Vitals:    07/16/23 1407   BP: (!) 172/96   Pulse: 89   Temp: 36.9 ?C (98.4 ?F)   SpO2: 95%   PainSc: Zero   Weight: 116.1 kg (256 lb)   Height: 157.5 cm (5' 2)  Comment: per pt     Body mass index is 46.82 kg/m?Alicia Fox     Physical Exam  Vitals reviewed.   Constitutional:       Appearance: Normal appearance.   Pulmonary:      Effort: Pulmonary effort is normal. No respiratory distress.   Skin:     General: Skin is warm and dry.   Neurological:      Mental Status: She is alert and oriented to person, place, and time.   Psychiatric:         Mood and Affect: Mood normal.         Behavior: Behavior normal.         Thought Content: Thought content normal.         Judgment: Judgment normal.                Assessment and Plan:    Problem   Osa (Obstructive Sleep Apnea)    Split night Pap titration dated 10/21/2017 at the [facility] sleep lab showed severe obstructive sleep apnea with AHI of  105.6 events per hour successful CPAP titration 12 cmH2O using full facemask elevated frequency of.  Limb movement with index of 30.8 events per hour and nocturnal hypoxemia with oxygen desaturation nadir of 77% oxygen saturation below 88% for about 99.1 minutes    Pt set up with CPAP @ 12cm H2O on 12/04/2017.  DME: [facility]    Pt set up with CPAP @ 12cm H2O on 05/01/23.  DME: [facility]  / [facility]  Insurance: Medicare / [employer]  If the patient has to meet compliance, their compliance window will be from 06/01/23 to 07/30/23.           OSA (obstructive sleep apnea)  Reviewed CPAP download. 30/30 days of usage.   Wearing 4 hours or greater 97% of nights. The average use on days used was 6 hours and 32 minutes.  Residual AHI 2.9.    The median leak was 3.4 L/min. She is tolerating and benefiting from therapy.     - Will continue current settings, CPAP 12. Will ask DME to look at machine turning on automatically when she is not wearing it.     - Keep in contact with DME for routine supplies, need to contact us  directly if unable to communicate with DME in a timely manner.     She has our contact information and was encouraged to call us  with any questions or concerns.    RTC in one year with Dr. Florie Husband per staff policy    Orders Placed This Encounter    AMB REFERRAL TO HOME CARE     Total time- 25 minutes

## 2023-07-16 ENCOUNTER — Encounter: Admit: 2023-07-16 | Discharge: 2023-07-16 | Payer: MEDICARE

## 2023-07-16 ENCOUNTER — Ambulatory Visit: Admit: 2023-07-16 | Discharge: 2023-07-17 | Payer: MEDICARE

## 2023-07-16 DIAGNOSIS — G4733 Obstructive sleep apnea (adult) (pediatric): Secondary | ICD-10-CM

## 2023-07-16 NOTE — Patient Instructions
 If you have questions or concerns, please call my nurse at [phone] or feel free to send me a mychart message

## 2023-07-16 NOTE — Telephone Encounter
 The patient's PAP compliance f/u office visit notes from Lilli Reil on 07/16/23 were faxed to their DME, [facility] on 07/16/23 at 1505 per right fax.  Annie Hiroto Saltzman RN  .

## 2023-07-17 ENCOUNTER — Encounter: Admit: 2023-07-17 | Discharge: 2023-07-17 | Payer: MEDICARE

## 2023-07-17 NOTE — Telephone Encounter
 Order for  machine check  placed as discussed per office note on 07/16/23    DME: [facility]. Phone: [phone] Fax: [phone]  Will continue to follow up.

## 2023-07-21 ENCOUNTER — Encounter: Admit: 2023-07-21 | Discharge: 2023-07-21 | Payer: MEDICARE

## 2023-07-24 ENCOUNTER — Ambulatory Visit: Admit: 2023-07-24 | Discharge: 2023-07-25 | Payer: MEDICARE

## 2023-07-24 ENCOUNTER — Encounter: Admit: 2023-07-24 | Discharge: 2023-07-24 | Payer: MEDICARE

## 2023-07-25 ENCOUNTER — Encounter: Admit: 2023-07-25 | Discharge: 2023-07-25 | Payer: MEDICARE

## 2023-07-25 DIAGNOSIS — E119 Type 2 diabetes mellitus without complications: Secondary | ICD-10-CM

## 2023-07-26 ENCOUNTER — Encounter: Admit: 2023-07-26 | Discharge: 2023-07-26 | Payer: MEDICARE

## 2023-07-26 MED ORDER — PANTOPRAZOLE 40 MG PO TBEC
40 mg | ORAL_TABLET | Freq: Two times a day (BID) | ORAL | 3 refills | 90.00000 days | Status: AC
Start: 2023-07-26 — End: ?

## 2023-07-30 ENCOUNTER — Encounter: Admit: 2023-07-30 | Discharge: 2023-07-30 | Payer: MEDICARE

## 2023-08-03 ENCOUNTER — Encounter: Admit: 2023-08-03 | Discharge: 2023-08-03 | Payer: MEDICARE

## 2023-08-03 MED ORDER — GABAPENTIN 100 MG PO CAP
100 mg | ORAL_CAPSULE | Freq: Two times a day (BID) | ORAL | 3 refills | 30.00000 days | Status: AC
Start: 2023-08-03 — End: ?

## 2023-08-04 ENCOUNTER — Encounter: Admit: 2023-08-04 | Discharge: 2023-08-04 | Payer: MEDICARE

## 2023-08-04 DIAGNOSIS — 1 ERRONEOUS ENCOUNTER--DISREGARD: Secondary | ICD-10-CM

## 2023-08-08 ENCOUNTER — Encounter: Admit: 2023-08-08 | Discharge: 2023-08-08 | Payer: MEDICARE

## 2023-08-19 ENCOUNTER — Encounter: Admit: 2023-08-19 | Discharge: 2023-08-19 | Payer: MEDICARE

## 2023-08-19 DIAGNOSIS — Z853 Personal history of malignant neoplasm of breast: Secondary | ICD-10-CM

## 2023-08-19 DIAGNOSIS — Z9889 Other specified postprocedural states: Secondary | ICD-10-CM

## 2023-08-19 DIAGNOSIS — Z85110 Personal history of malignant carcinoid tumor of bronchus and lung: Secondary | ICD-10-CM

## 2023-08-19 DIAGNOSIS — Z8542 Personal history of malignant neoplasm of other parts of uterus: Secondary | ICD-10-CM

## 2023-08-19 DIAGNOSIS — D3A090 Benign carcinoid tumor of the bronchus and lung: Secondary | ICD-10-CM

## 2023-08-22 ENCOUNTER — Encounter: Admit: 2023-08-22 | Discharge: 2023-08-22 | Payer: MEDICARE

## 2023-08-27 ENCOUNTER — Encounter: Admit: 2023-08-27 | Discharge: 2023-08-27 | Payer: MEDICARE

## 2023-08-27 NOTE — Progress Notes
 Telehealth Visit Note    Date of Service: 08/28/2023    Subjective:           Alicia Fox is a 73 y.o. female.    History of Present Illness  Alicia Fox is here for Initial evaluation and management of Type 2 DM complicated by none. 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 she is referred by Dr. Harlene DELENA Dustman. This is Her initial visit with me.     Alicia Fox reports she was initially diagnosed with diabetes in 2024 after undergoing brain surgery (meningioma removal). Her blood sugar postoperatively was running 200-300 in the hospital. She was started on insulin  while hospitalized. In the outpatient setting she was able to stop insulin . Since hospital discharge blood sugar has been relatively well controlled with Metformin  1000 mg BID. Her most recent A1c was 6.7% in April 2025.     Alicia Fox endorses having a difficult 1 year given multiple health troubles. She endorses depression symptoms at this time. She reports using food as a comfort. She is Alicia Fox and grew up in a food oriented family. She denies thoughts of self harm, SI, or HI today.     Last A1c: 6.7% in 06/2023   - was 6.9% in 12/2022, 6.3% in 05/2022, 5.9% in 12/2021     DM:   Type: 2  Dx: 2024   - prediabetes in the years prior   Current treatment: metformin  1000 mg BID  Past treatment: n/a   - not considering SGLT2i given patients urinary incontinence, overactive bladder, and history of UTI's    - Not considering GLP-1 agonist given history of carcinoid tumor   Medication adherence: all the time   SMBG:  fingerstick glucose 1x daily    Glucometer log scanned under media tab (or patient message 08/27/23)  Hyperglycemia: intermittent fasting morning   Hypoglycemia: denies   Meals per day:  2-3 meals    - sometimes skips breakfast    - will eat another large snack or meal around 12-1am  CHO intake: tries to limit carbs and sugar    - limits portion sizes of pasta  - struggles with eating rice multiple times her week  Exercise: nothing organized   DM related hospitalizations:  denies      Complications of DM:  CAD: no  CVA: no  PVD:  no  Chronic/non-healing wounds: no  Amputations: no  Retinopathy:  no  Gastropathy:  no  Nephropathy:  no  Neuropathy:  no     UTI/yeast infection: yes  Pancreatitis: no  Personal/famHx of MTC or MEN2: no    Medications:  Statin: yes, atorvastatin  10 mg daily  ACE-I/ARB: yes, losartan  50 mg daily   ASA: no     Last eye exam: 05/2023 - 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 For fever or pain.    ascorbic acid (vitamin C) 500 mg tablet Take one tablet by mouth twice daily.    atorvastatin  (LIPITOR) 10 mg tablet Take one tablet by mouth at bedtime daily.    carvediloL  (COREG ) 3.125 mg tablet TAKE ONE TABLET BY MOUTH TWICE DAILY  WITH MEALS. TAKE WITH FOOD.    cetirizine  (ZYRTEC ) 10 mg tablet Take one tablet by mouth every morning.    cholecalciferol  (VITAMIN D-3) 1,000 units tablet Take one tablet by mouth daily.    clobetasoL (TEMOVATE) 0.05 % topical cream Apply 1 inch topically to affected area twice daily. Apply to affected area    Cranberry 500 mg cap Take one capsule by mouth daily.    cranberry conc-ascorbic acid 4,200-20 mg cap Take 2 capsules by mouth every morning.    estradioL  (ESTRACE ) 0.01 % (0.1 mg/g) vaginal cream Insert or Apply one g to vaginal area twice weekly.    gabapentin  (NEURONTIN ) 100 mg capsule TAKE 1 CAPSULE BY MOUTH TWICE A DAY    guaiFENesin  LA (MUCINEX ) 600 mg tablet Take one tablet by mouth every morning.    levothyroxine  (SYNTHROID ) 88 mcg tablet TAKE 1 TABLET BY MOUTH EVERY DAY IN THE MORNING    losartan  (COZAAR ) 50 mg tablet TAKE 1 TABLET BY MOUTH TWICE A DAY    MAGNESIUM  PO Take  by mouth.    melatonin 10 mg cap Take one capsule by mouth daily.    meloxicam  (MOBIC ) 15 mg tablet TAKE 1 TABLET BY MOUTH EVERY DAY AS NEEDED FOR PAIN    metFORMIN  (GLUCOPHAGE ) 500 mg tablet Take two tablets by mouth twice daily with meals. Indications: type 2 diabetes mellitus    ONETOUCH DELICA PLUS LANCET 33 gauge USE ONE EACH AS DIRECTED DAILY AS NEEDED. DIAG CODE: DM-2 INDICATIONS: TYPE 2 DIABETES MELLITUS    ONETOUCH ULTRA TEST test strip USE ONE STRIP AS DIRECTED DAILY BEFORE BREAKFAST. DIAGNOSIS CODE: DM-2 INDICATIONS: TYPE 2 DIABETES MELLITUS    ONETOUCH ULTRA2 METER kit USE AS DIRECTED. DIAGNOSIS CODE: DM 2 INDICATIONS: TYPE 2 DIABETES MELLITUS    other medication Medication Name & Strength (List each active ingredient & strength): Alicia Fox  Dose (how much): 2  Route: by mouth Frequency (how often): daily    other medication Medication Name & Strength (List each active ingredient & strength): vitamin K2 & K7  Dose (how much): 1 Route: by mouth  Frequency (how often): daily    pantoprazole  DR (PROTONIX ) 40 mg tablet TAKE 1 TABLET BY MOUTH TWICE A DAY    polycarbophil (FIBERCON) 625 mg tablet Take one tablet by mouth twice daily.    polyethylene glycol 3350  (MIRALAX ) 17 g packet Take one packet by mouth daily as needed.    potassium chloride  (KLOR-CON  SPRINKLE) 10 mEq capsule Take one capsule by mouth daily as needed (with torsemide ). Indications: prevention of low potassium in the blood    torsemide  (DEMADEX ) 20 mg tablet TAKE ONE TABLET BY MOUTH DAILY. INDICATIONS: VISIBLE WATER  RETENTION    trospium (SANCTURA) 20 mg tablet Take one tablet by mouth twice daily. Take with water  on an empty stomach, at least 1 hour before a meal.    vit C,E-Zn-coppr-lutein-zeaxan (PRESERVISION AREDS-2) 250-200-40-1 mg-unit-mg-mg cap Take 1 capsule by mouth twice daily.    Zinc  50 mg tab Take one tablet by mouth daily.           Telehealth Body Mass Index: [phone] at 08/27/2023  7:21 PM    Physical Exam  Vitals reviewed.   Constitutional: General: She is not in acute distress.     Appearance: Normal appearance. She is not ill-appearing.   HENT:      Head: Normocephalic and atraumatic.      Nose: Nose normal.   Eyes:      Extraocular Movements: Extraocular  movements intact.   Pulmonary:      Effort: Pulmonary effort is normal. No respiratory distress.   Skin:     Coloration: Skin is not jaundiced.   Neurological:      Mental Status: She is alert and oriented to person, place, and time.   Psychiatric:         Mood and Affect: Mood normal.         Behavior: Behavior normal.         Lab Draw:  Lab Results   Component Value Date/Time    HGBA1C 6.7 (H) 06/03/2023 11:59 AM    HGBA1C 6.9 (H) 12/26/2022 10:26 AM    HGBA1C 6.3 (H) 05/29/2022 11:57 AM     POC:  No results found for: A1C    Comprehensive Metabolic Profile    Lab Results   Component Value Date/Time    NA 141 06/03/2023 11:59 AM    K 4.0 06/03/2023 11:59 AM    CL 100 06/03/2023 11:59 AM    GLU 123 (H) 06/03/2023 11:59 AM    BUN 17 06/03/2023 11:59 AM    CR 0.62 06/03/2023 11:59 AM    CA 11.3 (H) 06/03/2023 11:59 AM    TOTPROT 7.6 06/03/2023 11:59 AM    Lab Results   Component Value Date/Time    TOTBILI 0.5 06/03/2023 11:59 AM    ALBUMIN 4.5 06/03/2023 11:59 AM    ALKPHOS 117 (H) 06/03/2023 11:59 AM    AST 15 06/03/2023 11:59 AM    CO2 31 (H) 06/03/2023 11:59 AM    ALT 16 06/03/2023 11:59 AM    GAP 10 06/03/2023 11:59 AM    EGFR1 >60 12/26/2022 10:26 AM           Lab Results   Component Value Date    CHOL 186 12/26/2022    TRIG 134 12/26/2022    HDL 66 12/26/2022    LDL 109 (H) 12/26/2022    VLDL 27 12/26/2022    NONHDLCHOL 120 12/26/2022    CHOLHDLC 3 02/23/2018      Lab Results   Component Value Date    MCALBR 16.2 06/03/2023    URMALBCRRAT 17.6 06/03/2023       Total Time Today was 62 minutes in the following activities: Preparing to see the patient, Obtaining and/or reviewing separately obtained history, Performing a medically appropriate examination and/or evaluation, Counseling and educating the patient/family/caregiver, Ordering medications, tests, or procedures, Referring and communication with other health care professionals (when not separately reported), Documenting clinical information in the electronic or other health record, and Independently interpreting results (not separately reported) and communicating results to the patient/family/caregiver       Assessment and Plan:    Diabetes mellitus type 2,  controlled    - A1c 6.7% in 06/2023 - At Goal    - Target A1c <7% without excessive hypoglycemia   - Currently on metformin     - Complicated by n/a    - Assessment of glycemic control: adequate A1c, intermittent fasting hyperglycemia 2/2 dietary indiscretion     Plan:  Glucometer log reviewed today   Intermittent fasting morning hyperglycemia (<200 mg/dL), likely secondary to patient eating a large snack or meal after midnight some nights. She also reports consuming low protein and higher carbohydrate meals.   Continue metformin  1000 mg BID  Not considering GLP-1 agonist given history of carcinoid tumor   Not considering SGLT2i given patients overactive bladder, urge incontinence, and history of UTI's  Schedule visit with  diabetes education/nutritionist to discuss dietary modifications for blood sugar reduction   Reminded pt to have medical alert bracelet    Diabetic complications - prevention and management:  Electrolytes and renal function: Last done 06/2023  Dental visit: Last done   Eye exam: Last done 05/2023.  Retinopathy - no  Goal BP <130/80. TH visit today  Lipid panel: Last done 12/2022. Statin: yes   Urine microalbumin/Cr: 06/2023. ACE/ARB?: yes  Foot exam / monofilament exam (recommended annually): Last done 08/2023 with [facility]. Discussed foot care.   Diabetic Educator and/or nutritionist visit: referral placed today   Patient has questions regarding supplement use   She endorses using food to comfort herself during stressful time and depressive episodes     BMI 46.63  Discussed patient's BMI with Alicia Fox.  The body mass index is 46.63 kg/m?SABRA and falls within the category of Obesity 3 (>40); counseled regarding weight loss and referral written for nutritional counseling for weight loss.    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      RTC 3-6 months       Roselie DELENA Hope, PA-C

## 2023-08-28 ENCOUNTER — Encounter: Admit: 2023-08-28 | Discharge: 2023-08-28 | Payer: MEDICARE

## 2023-08-28 ENCOUNTER — Ambulatory Visit: Admit: 2023-08-28 | Discharge: 2023-08-29 | Payer: MEDICARE

## 2023-08-28 DIAGNOSIS — R7989 Other specified abnormal findings of blood chemistry: Secondary | ICD-10-CM

## 2023-08-28 DIAGNOSIS — E119 Type 2 diabetes mellitus without complications: Secondary | ICD-10-CM

## 2023-08-28 NOTE — Patient Instructions
 It was great seeing you today!    During your visit we discussed:   - Continue metformin  1000 mg twice daily   - Check blood sugar 1-2x per day     - I am sending a referral to the [facility] nutritionists/diabetes educators    - our office will call you or send a mychart message to get your scheduled     - I will have a visit scheduled with one of the Endocrinology physicians to discuss your elevated parathyroid hormone (PTH)      I would like to see you back in 3-6 months.   Please contact my nurse via MyChart message or telephone call at [phone] if you have any further questions or concerns!    Roselie DELENA Hope, PA-C    Free Online Classes - [employer] with [employer]

## 2023-09-02 ENCOUNTER — Encounter: Admit: 2023-09-02 | Discharge: 2023-09-02 | Payer: MEDICARE

## 2023-09-02 DIAGNOSIS — Z85110 Personal history of malignant carcinoid tumor of bronchus and lung: Secondary | ICD-10-CM

## 2023-09-02 DIAGNOSIS — D3A090 Benign carcinoid tumor of the bronchus and lung: Secondary | ICD-10-CM

## 2023-09-02 DIAGNOSIS — I5032 Chronic diastolic (congestive) heart failure: Secondary | ICD-10-CM

## 2023-09-02 DIAGNOSIS — I10 Essential (primary) hypertension: Principal | ICD-10-CM

## 2023-09-02 DIAGNOSIS — G936 Cerebral edema: Secondary | ICD-10-CM

## 2023-09-02 DIAGNOSIS — M539 Dorsopathy, unspecified: Secondary | ICD-10-CM

## 2023-09-02 DIAGNOSIS — Z9889 Other specified postprocedural states: Secondary | ICD-10-CM

## 2023-09-02 DIAGNOSIS — E119 Type 2 diabetes mellitus without complications: Secondary | ICD-10-CM

## 2023-09-02 DIAGNOSIS — R0602 Shortness of breath: Secondary | ICD-10-CM

## 2023-09-02 DIAGNOSIS — D496 Neoplasm of unspecified behavior of brain: Secondary | ICD-10-CM

## 2023-09-02 DIAGNOSIS — G4733 Obstructive sleep apnea (adult) (pediatric): Secondary | ICD-10-CM

## 2023-09-02 DIAGNOSIS — E782 Mixed hyperlipidemia: Secondary | ICD-10-CM

## 2023-09-02 DIAGNOSIS — E66813 Class 3 severe obesity in adult (CMS-HCC): Secondary | ICD-10-CM

## 2023-09-02 DIAGNOSIS — Z8616 History of COVID-19: Secondary | ICD-10-CM

## 2023-09-02 MED ORDER — BUMETANIDE 2 MG PO TAB
2 mg | ORAL_TABLET | Freq: Every day | ORAL | 3 refills | 30.00000 days | Status: AC
Start: 2023-09-02 — End: ?

## 2023-09-02 NOTE — Progress Notes
 Date of Service: 09/02/2023    Alicia Fox is a 73 y.o. female.       HPI      Alicia Fox is a 73 y.o. 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    Patient does report having shortness of breath with physical activity, fatigue, there is mild lower extremity edema right above the ankle.  She is known to have mild LV dysfunction.    Patient is morbidly obese and this probably represents the biggest health issue at the present time, because subsequently she has diabetes mellitus type 2 and likely combined systolic/diastolic dysfunction.  Patient does admit to dietary indiscretion and she seeks refuge in eating sweets.         Vitals:    09/02/23 1450   BP: (!) 163/93   BP Source: Arm, Left Upper   Pulse: 89   SpO2: 95%   O2 Device: None (Room air)   PainSc: Zero   Weight: 115.6 kg (254 lb 12.8 oz)   Height: 160 cm (5' 3)     Body mass index is 45.14 kg/m?SABRA     Past Medical History  Patient Active Problem List    Diagnosis Date Noted    History of COVID-19 05/13/2019     Priority: Medium     She developed COVID in 12/20 with week long hospitalization and need for oxygen/steroids but not sent home on oxygen.  She apparently had pericardial effusion also as complication but no recurrence since single pericardiocentesis in March 2021.       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), bilateral 01/03/2023    Ptosis of right eyelid 01/03/2023    Age-related nuclear cataract of both eyes 01/03/2023    Hx of resection of meningioma 12/31/2022    Vasogenic brain edema (CMS-HCC) 11/28/2022    Brain compression (CMS-HCC) 11/28/2022    Oropharyngeal dysphagia 11/28/2022    Global aphasia 11/28/2022    Acute postoperative anemia due to expected blood loss 11/27/2022    Brain mass 11/26/2022    Class 3 severe obesity in adult (CMS-HCC) 11/26/2022    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/24/2022    Moderate episode of recurrent major depressive disorder (CMS-HCC) 12/26/2021    Type 2 diabetes mellitus without complication, without long-term current use of insulin  (CMS-HCC) 12/26/2021    Stress incontinence of urine 12/20/2020    Multilevel degenerative disc disease 11/21/2020    Other insomnia 08/16/2020    Bilateral primary osteoarthritis of knee 08/10/2019    Hooded upper eyelid 08/04/2019    Other headache syndrome 05/28/2019    COVID-19 vaccine administered 05/28/2019    Shortness of breath with exposure to severe acute respiratory syndrome coronavirus 2 (SARS-CoV-2) 04/05/2019    Morbid obesity (CMS-HCC) 02/18/2019    OSA (obstructive sleep apnea) 02/17/2018  Split night Pap titration dated 10/21/2017 at the [place] sleep lab showed severe obstructive sleep apnea with AHI of 105.6 events per hour successful CPAP titration 12 cmH2O using full facemask elevated frequency of.  Limb movement with index of 30.8 events per hour and nocturnal hypoxemia with oxygen desaturation nadir of 77% oxygen saturation below 88% for about 99.1 minutes    Pt set up with CPAP @ 12cm H2O on 12/04/2017.  DME: [facility] [place]    Pt set up with CPAP @ 12cm H2O on 05/01/23.  DME: [facility]  / [facility]  Insurance: Medicare / [employer]  If the patient has to meet compliance, their compliance window will be from 06/01/23 to 07/30/23.       Carcinoid tumor of left lung (CMS-HCC) 12/17/2016     On 03/21/17 she underwent a Robotic assisted left lower lobectomy under the direction of Dr. Dietrich Brooking.  Pathology impression:  Lung: Typical carcinoid tumor.   Greatest dimension (centimeters): 3.0 cm. Additional dimensions (centimeters): 2.5 x 2.5 cm.  Pathologic Stage Classification (pTNM, AJCC 8th Edition): pT1c N0       Spondylosis of cervical region without myelopathy or radiculopathy 09/24/2016    Carpal tunnel syndrome, bilateral 08/06/2016    Abnormal CT of the head 08/06/2016    Cough 08/09/2014    [place] cardiac risk <10% in next 10 years 08/09/2014    Bilateral edema of lower extremity 04/28/2014    Diastolic heart failure (CMS-HCC) 04/28/2014    Hyperlipemia 01/18/2010    Hypertension 07/21/2008    Fen-phen history 07/21/2008         Review of Systems   Constitutional: Negative.   HENT: Negative.     Eyes: Negative.    Cardiovascular: Negative.    Respiratory: Negative.     Endocrine: Negative.    Hematologic/Lymphatic: Negative.    Skin: Negative.    Musculoskeletal: Negative.    Gastrointestinal: Negative.    Genitourinary: Negative.    Neurological: Negative.    Psychiatric/Behavioral: Negative.     Allergic/Immunologic: Negative.        Physical Exam  General Appearance: Morbidly obese, BMI = 45.14 kg/m?  Skin: warm, moist, no ulcers or xanthomas  Eyes: conjunctivae and lids normal, pupils are equal and round  Lips & Oral Mucosa: no pallor or cyanosis  Neck Veins: neck veins are flat, neck veins are not distended  Chest Inspection: chest is normal in appearance  Respiratory Effort: breathing comfortably, no respiratory distress  Auscultation/Percussion: lungs clear to auscultation, no rales or rhonchi, no wheezing  Cardiac Rhythm: regular rhythm and normal rate  Cardiac Auscultation: S1, S2 normal, no rub, no gallop  Murmurs: no murmur  Carotid Arteries: normal carotid upstroke bilaterally, no bruit  Lower Extremity Edema: 1+ bilateral pretibial/above the ankle edema  Abdominal Exam: soft, non-tender, no masses, bowel sounds normal  Liver & Spleen: no organomegaly  Language and Memory: patient responsive and seems to comprehend information  Neurologic Exam: neurological assessment grossly intact      Cardiovascular Studies      Cardiovascular Health Factors  Vitals BP Readings from Last 3 Encounters:   09/02/23 (!) 163/93   07/16/23 (!) 172/96   07/03/23 (!) 177/83     Wt Readings from Last 3 Encounters:   09/02/23 115.6 kg (254 lb 12.8 oz)   07/16/23 116.1 kg (256 lb)   07/03/23 115.7 kg (255 lb)     BMI Readings from Last 3 Encounters:   09/02/23 45.14 kg/m?  07/16/23 46.82 kg/m?   07/03/23 45.90 kg/m?      Smoking Social History     Tobacco Use   Smoking Status Former    Current packs/day: 0.00    Average packs/day: 0.5 packs/day for 15.0 years (7.5 ttl pk-yrs)    Types: Cigarettes    Start date: 03/04/1974    Quit date: 05/02/1989    Years since quitting: 34.3   Smokeless Tobacco Never   Tobacco Comments    quit in 1991      Lipid Profile Cholesterol   Date Value Ref Range Status   12/26/2022 186 <200 MG/DL Final     HDL   Date Value Ref Range Status   12/26/2022 66 >40 MG/DL Final     LDL   Date Value Ref Range Status   12/26/2022 109 (H) <100 mg/dL Final     Triglycerides   Date Value Ref Range Status   12/26/2022 134 <150 MG/DL Final      Blood Sugar Hemoglobin A1C   Date Value Ref Range Status   06/03/2023 6.7 (H) 4.0 - 5.7 % Final     Comment:     The ADA recommends that most patients with type 1 and type 2 diabetes maintain an A1c level <7%.     Glucose   Date Value Ref Range Status   06/03/2023 123 (H) 70 - 100 mg/dL Final   89/75/7975 880 (H) 70 - 100 MG/DL Final   90/71/7975 859 (H) 70 - 100 MG/DL Final     Glucose, POC   Date Value Ref Range Status   12/03/2022 110 (H) 70 - 100 MG/DL Final   89/98/7975 790 (H) 70 - 100 MG/DL Final   89/98/7975 850 (H) 70 - 100 MG/DL Final          Problems Addressed Today  Encounter Diagnoses   Name Primary?    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  Combined chronic systolic/diastolic heart failure  2D echo Doppler study performed in October 2022 repeated LVEF = 45%  2D echo Doppler study performed in June 2024-LVEF = 45%  Regadenoson  MPI performed in October 2022-no ischemia, LVEF = 62%  2.  Morbid obesity, BMI = 43.14 kg/m?  3.  Diabetes mellitus type 2-currently on metformin   4.  Chronic bilateral lower extremity edema due to #1 and #3 and possibly a component of diabetic neuropathy  Patient has been on torsemide   5.  History of pericardial effusion, status post pericardiocentesis-no recurrence  6.  History of several malignancies  History of breast cancer, status post bilateral mastectomy and breast implants  History of uterine cancer  History of lung cancer-status post robotic  surgery in 2019-incomplete remission  7.  History of meningioma, status postcraniotomy in September 2024  8.  History of fen-phen use  9.  Acquired hypothyroidism-on thyroid replacement therapy  10.  History of COVID-19 viral syndrome, status post COVID-19 mRNA injection    Plan:    1.  Discontinue torsemide   2.  Start bumetanide 2 mg p.o. daily  3.  Further evaluation with a 2D echo Doppler study-we will follow-up on the results and call with further recommendations  3. I recommended the following:  Completely eliminate any junk food from the diet, stop drinking soda pop or any other carbonated drinks  Do not eat processed foods  Ensure good hydration with water   Drastically decrease the intake of sugar and carbohydrates increase protein intake  Perform daily physical activity up to 10,000 steps a day, also consider resistance exercises         Total Time Today was 40 minutes in the following activities: Preparing to see the patient, Obtaining and/or reviewing separately obtained history, Performing a medically appropriate examination and/or evaluation, Counseling and educating the patient/family/caregiver, Ordering medications, tests, or procedures, Referring and communication with other health care professionals (when not separately reported), Documenting clinical information in the electronic or other health record, Independently interpreting results (not separately reported) and communicating results to the patient/family/caregiver, and Care coordination (not separately reported)          Current Medications (including today's revisions)   acetaminophen  (TYLENOL ) 325 mg tablet Take two tablets by mouth every 6 hours as needed. For fever or pain.    ascorbic acid (vitamin C) 500 mg tablet Take one tablet by mouth twice daily.    atorvastatin  (LIPITOR) 10 mg tablet Take one tablet by mouth at bedtime daily.    carvediloL  (COREG ) 3.125 mg tablet TAKE ONE TABLET BY MOUTH TWICE DAILY WITH MEALS. TAKE WITH FOOD.    cetirizine  (ZYRTEC ) 10 mg tablet Take one tablet by mouth every morning.    cholecalciferol  (VITAMIN D-3) 1,000 units tablet Take one tablet by mouth daily.    clobetasoL (TEMOVATE) 0.05 % topical cream Apply 1 inch topically to affected area twice daily. Apply to affected area    Cranberry 500 mg cap Take one capsule by mouth daily.    cranberry conc-ascorbic acid 4,200-20 mg cap Take 2 capsules by mouth every morning.    estradioL  (ESTRACE ) 0.01 % (0.1 mg/g) vaginal cream Insert or Apply one g to vaginal area twice weekly.    gabapentin  (NEURONTIN ) 100 mg capsule TAKE 1 CAPSULE BY MOUTH TWICE A DAY    guaiFENesin  LA (MUCINEX ) 600 mg tablet Take one tablet by mouth every morning.    levothyroxine  (SYNTHROID ) 88 mcg tablet TAKE 1 TABLET BY MOUTH EVERY DAY IN THE MORNING    losartan  (COZAAR ) 50 mg tablet TAKE 1 TABLET BY MOUTH TWICE A DAY    MAGNESIUM  PO Take  by mouth.    melatonin 10 mg cap Take one capsule by mouth daily.    meloxicam  (MOBIC ) 15 mg tablet TAKE 1 TABLET BY MOUTH EVERY DAY AS NEEDED FOR PAIN    metFORMIN  (GLUCOPHAGE ) 500 mg tablet Take two tablets by mouth twice daily with meals. Indications: type 2 diabetes mellitus    ONETOUCH DELICA PLUS LANCET 33 gauge USE ONE EACH AS DIRECTED DAILY AS NEEDED. DIAG CODE: DM-2 INDICATIONS: TYPE 2 DIABETES MELLITUS    ONETOUCH ULTRA TEST test strip USE ONE STRIP AS DIRECTED DAILY BEFORE BREAKFAST. DIAGNOSIS CODE: DM-2 INDICATIONS: TYPE 2 DIABETES  MELLITUS    ONETOUCH ULTRA2 METER kit USE AS DIRECTED. DIAGNOSIS CODE: DM 2 INDICATIONS: TYPE 2 DIABETES MELLITUS    other medication Medication Name & Strength (List each active ingredient & strength): Emma  Dose (how much): 2  Route: by mouth Frequency (how often): daily    other medication Medication Name & Strength (List each active ingredient & strength): vitamin K2 & K7  Dose (how much): 1 Route: by mouth  Frequency (how often): daily    pantoprazole  DR (PROTONIX ) 40 mg tablet TAKE 1 TABLET BY MOUTH TWICE A DAY    polycarbophil (FIBERCON) 625 mg tablet Take one tablet by mouth twice daily.    polyethylene glycol 3350  (MIRALAX ) 17 g packet Take one packet by mouth daily as needed.    potassium chloride  (KLOR-CON  SPRINKLE) 10 mEq capsule Take one capsule by mouth daily as needed (with torsemide ). Indications: prevention of low potassium in the blood    torsemide  (DEMADEX ) 20 mg tablet TAKE ONE TABLET BY MOUTH DAILY. INDICATIONS: VISIBLE WATER  RETENTION    trospium (SANCTURA) 20 mg tablet Take one tablet by mouth twice daily. Take with water  on an empty stomach, at least 1 hour before a meal.    vit C,E-Zn-coppr-lutein-zeaxan (PRESERVISION AREDS-2) 250-200-40-1 mg-unit-mg-mg cap Take 1 capsule by mouth twice daily.    Zinc  50 mg tab Take one tablet by mouth daily.

## 2023-09-02 NOTE — Patient Instructions
Thank you for visiting our office today.    Continue the same medications as you have been doing.          We will be pursuing the following tests after your appointment today:  Echocardiogram    Please call us in the meantime with any questions or concerns.        Please allow 5-7 business days for our providers to review your results. All normal results will go to MyChart. If you do not have Mychart, it is strongly recommended to get this so you can easily view all your results. If you do not have mychart, we will attempt to call you once with normal lab and testing results. If we cannot reach you by phone with normal results, we will send you a letter.  If you have not heard the results of your testing after one week please give us a call.       Your Cardiovascular Medicine [place] Team (Steve, Lisa, Jamie and Young Mulvey)  phone number is [phone].

## 2023-09-06 ENCOUNTER — Encounter: Admit: 2023-09-06 | Discharge: 2023-09-06 | Payer: MEDICARE

## 2023-09-11 ENCOUNTER — Encounter: Admit: 2023-09-11 | Discharge: 2023-09-11 | Payer: MEDICARE

## 2023-09-12 ENCOUNTER — Ambulatory Visit: Admit: 2023-09-12 | Discharge: 2023-09-12 | Payer: MEDICARE

## 2023-09-12 ENCOUNTER — Encounter: Admit: 2023-09-12 | Discharge: 2023-09-12 | Payer: MEDICARE

## 2023-09-15 ENCOUNTER — Encounter: Admit: 2023-09-15 | Discharge: 2023-09-15 | Payer: MEDICARE

## 2023-09-15 DIAGNOSIS — E782 Mixed hyperlipidemia: Secondary | ICD-10-CM

## 2023-09-15 DIAGNOSIS — I5032 Chronic diastolic (congestive) heart failure: Secondary | ICD-10-CM

## 2023-09-15 DIAGNOSIS — I10 Essential (primary) hypertension: Principal | ICD-10-CM

## 2023-09-15 DIAGNOSIS — G4733 Obstructive sleep apnea (adult) (pediatric): Secondary | ICD-10-CM

## 2023-09-16 ENCOUNTER — Ambulatory Visit: Admit: 2023-09-16 | Discharge: 2023-09-17 | Payer: MEDICARE

## 2023-09-16 ENCOUNTER — Encounter: Admit: 2023-09-16 | Discharge: 2023-09-16 | Payer: MEDICARE

## 2023-09-18 ENCOUNTER — Encounter: Admit: 2023-09-18 | Discharge: 2023-09-18 | Payer: MEDICARE

## 2023-09-18 ENCOUNTER — Ambulatory Visit: Admit: 2023-09-18 | Discharge: 2023-09-19 | Payer: MEDICARE

## 2023-09-20 NOTE — Progress Notes
 There is no height or weight on file to calculate BMI.      OCT OPTIC NERVE          Optic Nerve  Right Eye  Optic nerve findings: Normal Progression has been stable.     Left Eye  Optic nerve findings: Normal Progression has been stable.          VISUAL FIELD, EXTEND          [facility] Automated exam type was used.     Right Eye  Threshold was 24-2. Strategy was SITA-FAST. Non-Specific Defects     Left Eye  Threshold was 24-2. Strategy was SITA-FAST. Progression has worsened. Central Scotoma               Assessment and Plan:  This nice lady was sent here by her PCP Dr. Harlene Dustman for our evaluation of reported macular degeneration  and optic nerve disease based on OCT imaging and examination by Dr Alyce Angry.  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  She had left frontotemporal craniotomy with resection of the mass on 11/26/22 by Dr. Jaycee.  At her 12/26/22 f/up visit with Dr. Jaycee she had reported stable vision.     Problem   Dermatochalasis of Right Upper Eyelid   Armd (Age-Related Macular Degeneration), Bilateral   Ptosis of Right Eyelid   Age-Related Nuclear Cataract of Both Eyes      Today this nice lady demonstrates 20/40 and 20/25 with normal pupils.  Her visual fields today (HVF 24-2) are completely normal.  OCT imaging of the optic nerves demonstrate no evidence of RNFL thinning.  Macular OCT demonstrated reticular pattern dry macular degeneration.  External exam shows dermatochalasis w/ ptosis worse OD than OS.  Muscle balance and motility/cranial nerve exam was unremarkable.  Anterior segment demonstrates bilateral nuclear cataracts, slightly worse OD than OS.  Dilated exam was remarkable only for her macular degeneration.     In summary, we see no evidence of visual changes related to her meningioma.  Her cataracts are becoming significant so we suggest recheck of her cataracts, ARMD, and her optic nerves in 4 months.     05/06/23 visit  Left temporal meningioma, s/p resection on 11/26/22; normal HVF and stable/normal OCT nerves.  Bilateral ARMD (reticular pattern): no change on OCT.  Cataracts: pt reports persistent problems driving at night.  Her glare test shows decreased acuity to 20/1600 and 20/1250 respectively, but she sees 20/25 and 20/20 otherwise.  She'll continue to monitor.  4.   Right upper lid dermatochalasis/ptosis          09/24/23 visit  Cataracts, not visually significant  Right lid ptosis/dermatochalasis  Left upper eyelid retraction  Bilateral ARMD, on AREDS multivitamin  Left temporal meningioma, s/p resection 11/26/22. 07/03/23 MRI showed possible residual meningioma along anterior left cavernous sinus to edge of superior orbital fissure  Today, she does not demonstrate evidence of CN III, IV, or VI palsy. Visual field testing today shows good reliability but a new central scotoma OS. Given stable visual acuity and OCT imaging, we do not have an explanation for the visual field change. We recommend repeating the visual field test OS in a couple of weeks to ensure that the test result is reproducible.    Camie Gaw, MD  Ophthalmology, PGY-3    ATTESTATION    I personally performed the key portions of the E/M visit, discussed  case with resident and concur with resident documentation of history, physical exam, assessment, and treatment plan unless otherwise noted.    Staff name:  Debby JINNY Luck, MD Date: 09/24/2023

## 2023-09-24 ENCOUNTER — Encounter: Admit: 2023-09-24 | Discharge: 2023-09-24 | Payer: MEDICARE

## 2023-09-24 ENCOUNTER — Ambulatory Visit: Admit: 2023-09-24 | Discharge: 2023-09-25 | Payer: MEDICARE

## 2023-09-24 DIAGNOSIS — H3530 Unspecified macular degeneration: Principal | ICD-10-CM

## 2023-09-25 DIAGNOSIS — Z9889 Other specified postprocedural states: Secondary | ICD-10-CM

## 2023-09-25 DIAGNOSIS — D496 Neoplasm of unspecified behavior of brain: Secondary | ICD-10-CM

## 2023-09-25 DIAGNOSIS — Z8603 Personal history of neoplasm of uncertain behavior: Secondary | ICD-10-CM

## 2023-10-01 ENCOUNTER — Encounter: Admit: 2023-10-01 | Discharge: 2023-10-01 | Payer: MEDICARE

## 2023-10-01 DIAGNOSIS — Z853 Personal history of malignant neoplasm of breast: Secondary | ICD-10-CM

## 2023-10-01 DIAGNOSIS — Z803 Family history of malignant neoplasm of breast: Secondary | ICD-10-CM

## 2023-10-01 DIAGNOSIS — D3A090 Benign carcinoid tumor of the bronchus and lung: Principal | ICD-10-CM

## 2023-10-01 DIAGNOSIS — D496 Neoplasm of unspecified behavior of brain: Secondary | ICD-10-CM

## 2023-10-01 DIAGNOSIS — Z8042 Family history of malignant neoplasm of prostate: Secondary | ICD-10-CM

## 2023-10-01 NOTE — Progress Notes
 Hereditary Cancer Clinic   Genetic Counseling  Date: 10/01/2023  Alicia Fox  [place]# [id number]  DOB Jul 20, 1950    Dx Codes: Z85.3, D3A.090, D49.6, Z80.42, Z80.3    Obtained patient?s verbal consent to provide this telehealth visit due to the [facility]    Alicia Fox, age 73 y.o., was seen in telemedicine clinic today for genetic consultation regarding her personal and family history of cancer. She was previously seen in 2021 and returns to discuss updated testing.     Clinical History taken 2021 updated today: Alicia Fox was diagnosed with breast cancer at 73 years of age, she was diagnosed with carcinoid tumor of the lung at 73 years of age, her ovaries and uterus were removed at 73 years of age due to excessive bleeding she had precancerous cells on removal. 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      Family History provided in 2021 and updated today:  - Maternal:  Mother passed away at 19 years of age, her ovaries and uterus were removed in her 70's due to prolapse, no known cancer history.  Aunts / Uncles / Cousins: One aunt passed away at 47 years of age, she was diagnosed with breast cancer at 33 years of age. One aunt and uncle passed away in their 38s or 12s with no known cancer histories. No cousins with known cancer histories.   Grandparents: Grandparents passed away at 45 years of age, grandfather was diagnosed with leukemia in his 57s.   Ancestry:  [place]                                  no known Jewish ancestry  - Paternal:  Father passed away at 39 years of age with no known cancer history.   Aunts / Uncles / Cousins: One uncle passed away at 56 years of age from heart disease. Two uncles uncle passed away in their 53s with no known cancer history. One uncle is 62 years of age with no known cancer histories. No cousins with known cancer histories. Grandparents: Grandmother passed away at 59 years of age, she was diagnosed with breast cancer at 73 years of age. Grandfather passed away in his 32's with no known cancer history.   Ancestry: [place]                              no known Jewish ancestry  - Siblings (16):  One 79 year old brother who was diagnosed with prostate and kidney cancers at 74 years of age, he was diagnosed with a benign stomach tumor at 36 years of age. One brother passed away at 63 years of age from complications of pneumonia, he was diagnosed with intellectual disability. Two sisters are 59 and 30 years of age with no major medical issues. Four additional brothers are 7, 47, 5 and 23 years of age with no known cancer histories.   - Children (37):  One 38 year old daughter who has no cancer history, her ovaries and uterus have been removed.      Discussion:  We discussed hereditary cancer syndromes involving cancers in the family.  Red flags for hereditary syndromes include bilateral / multifocal disease, young age of  onset (often <50 yrs), and 2-3 relatives with the same, or related conditions.    We discussed the NCCN guidelines version 1.2026 regarding genetic testing for hereditary breast cancer syndromes. Based on her history of early onset breast cancer Alicia Fox met the initial testing criteria in 17-Oct-2019, however, there have  only been a few additional breast cancer genes added to the testing at this time. Therefore it is unclear if she would meet the criteria to pursue any updated testing. Given that, we will request the testing be placed on a billing hold and patient to be contacted about any out of pocket expense.        Testing for multiple genes for cancer susceptibility was recommended (a genetic panel).  IF a gene from the panel is found, this would affect medical management including surveillance for early detection and treatment decisions.    The discussion included an explanation of the different types of results which may be reported (positive / negative / or variant of unknown significance).  We discussed the various syndromes and diseases associated with the genes on the panel.     Our discussion included the patient's increased risk status, how genes affect cancer susceptibility, potential benefits, risk, and limitations of testing, and methods for early detection and prevention.    The [facility] Genetic Counseling Program utilizes the referring provider as the ordering provider for all genetic testing ordered. The genetic counselor will document and disclose test results to the patient.     Plan:  Blood will be drawn and sent to [facility] for CustomNext Panel + RNA: 85 genes: AIP, ALK, APC, ATM, ATRIP, AXIN2, BAP1, BARD1, BMPR1A, BRCA1, BRCA2, BRIP1, CDC73, CDH1, CDK4, CDKN1B, CDKN2A, CEBPA, CHEK2, CTNNA1, DDX41, DICER1, EGFR, EGLN1, EPCAM, ETV6, FH, FLCN, GATA2, GREM1, HOXB13, KIF1B, KIT, LZTR1, MAX, MBD4, MEN1, MET, MITF, MLH1, MLH3, MSH2, MSH3, MSH6, MUTYH, NF1, NF2, NTHL1, PALB2, PALLD, PDGFRA, PHOX2B, PMS2, POLD1, POLE, POT1, PRKAR1A, PTCH1, PTEN, RAD51B, RAD51C, RAD51D, RB1, RET, RPS20, RNF43, RUNX1, SDHA, SDHAF2, SDHB, SDHC, SDHD, SMAD4, SMARCA4, SMARCB1, SMARCE1, STK11, SUFU, TERT, TMEM127, TP53, TSC1, TSC2, VHL, WT1. Sequencing and deletion/duplication is performed (EPCAM and GREM1 have partial sequencing), MITF is for the [id number].952G>A mutation only.    Time:  80 minutes.  Obtained and reviewed family history.  Discussed hereditary cancer syndromes, autosomal dominant inheritance / other inheritance as relevant, presymptomatic surveillance, management and treatment.  Testing outcomes, including possibility of uncertain variants, strategies for testing other family members if a gene alteration is found. Insurance information and out of pocket price discussed. Completed paperwork including consent for genetic testing.      Nydia Ytuarte, MS [facility]  Certified Genetic Counselor  [facility]  [address]  [address] c/o Genetics team  [place], [place] [address]    Email: [email or web address]  Telephone [phone]  Fax: [phone]  Appointments: [phone]      Pending:  Follow-up for DNA test results and interpretation will be once results are obtained.  Additional testing on family members will be pursued as appropriate.    Family History       Relation Status Problems (Age of Onset) - (Comment)    Mother Deceased at age 46 Complete Hysterectomy  (19 y)   uterine prolapse ;  Hypertension   Covid-19 fatality Feb 2021 ;  Atrial Fibrillation ;  Cataract    Father Deceased at age 3 Hypertension   COPD, PARKINSON'S death 2016/10/16 ;  COPD ;  Heart Failure   COPD, PARKINSON'S death 10/16/16 ;  Parkinson's  ;  Vision Loss   AMD    Sister Alive, age 28y Arthritis    Sister Alive, age 2y SLE   lupus    Brother Alive, age 1y Stomach tumor (benign)  (24 y) ;  Kidney Cancer  (15 y)   second kidney tumor benign dx 17 ;  Cancer-Prostate  (60 y) ;  Hypertension   A-fib. October 16, 2021    Brother Deceased at age 62 Birth Defect   Mentally handicapped ;  Early Death   Pneumonia staph sepsis  at 77yrs ;  Mental Illness    Brother Alive, age 49y     Brother Alive, age 14y     Brother Alive, age 57y Heart Failure   Heart valves,  pericardiocentesis    Brother Alive, age 60y     Maternal Grandmother Deceased at age 70     Maternal Grandfather Deceased at age 65 Cancer-Hematologic  (48 - 82 y)   leukemia    Paternal Grandmother Deceased at age 8 Cancer-Breast  8238 y)    Paternal Grandfather Deceased at age 49s     Daughter Alive, age 54y Complete Hysterectomy ;  Heart Attack  (50 y)    Maternal Aunt Deceased at age 67     Maternal Aunt Deceased at age 64 Cancer-Breast  (51 y)    Maternal Uncle Deceased at age 88     Paternal Uncle Deceased at age 72 Heart Attack    Paternal Uncle Deceased at age 54     Paternal Uncle Alive, age 40y     Paternal Uncle Deceased at age 11s

## 2023-10-01 NOTE — Telephone Encounter
-----   Message from [place] sent at 10/01/2023 10:52 AM CDT -----  IC FOLLOW UP REQUEST    Follow-up requested: [facility] blood draw and a result appointment  Notes:     DETAILS  Genetics Lab Draw Details   Type of kit: [facility]  Location: Patient choice of [place] location  Orders Placed: Yes    Follow Up Appointment Details   Appointment is needed 6 weeks from the scheduled blood draw appointment      IC REFERENCES  Location options for a draw  [employer] (schedule)   [facility] (schedule)  [facility] (schedule)  [facility] (schedule)  [facility] location (walk-in)  [facility] (walk-in)

## 2023-10-02 ENCOUNTER — Encounter: Admit: 2023-10-02 | Discharge: 2023-10-02 | Payer: MEDICARE

## 2023-10-02 NOTE — Telephone Encounter
-----   Message from [place] sent at 10/01/2023 10:52 AM CDT -----  IC FOLLOW UP REQUEST    Follow-up requested: [facility] blood draw and a result appointment  Notes:     DETAILS  Genetics Lab Draw Details   Type of kit: [facility]  Location: Patient choice of [place] location  Orders Placed: Yes    Follow Up Appointment Details   Appointment is needed 6 weeks from the scheduled blood draw appointment      IC REFERENCES  Location options for a draw  [employer] (schedule)   [facility] (schedule)  [facility] (schedule)  [facility] (schedule)  [facility] location (walk-in)  [facility] (walk-in)

## 2023-10-08 ENCOUNTER — Encounter: Admit: 2023-10-08 | Discharge: 2023-10-08 | Payer: MEDICARE

## 2023-10-08 ENCOUNTER — Ambulatory Visit: Admit: 2023-10-08 | Discharge: 2023-10-09 | Payer: MEDICARE

## 2023-10-22 ENCOUNTER — Encounter: Admit: 2023-10-22 | Discharge: 2023-10-22 | Payer: MEDICARE

## 2023-10-29 ENCOUNTER — Encounter: Admit: 2023-10-29 | Discharge: 2023-10-29 | Payer: MEDICARE

## 2023-11-16 ENCOUNTER — Encounter: Admit: 2023-11-16 | Discharge: 2023-11-16 | Payer: MEDICARE

## 2023-11-20 ENCOUNTER — Encounter: Admit: 2023-11-20 | Discharge: 2023-11-20 | Payer: MEDICARE

## 2023-11-20 ENCOUNTER — Ambulatory Visit: Admit: 2023-11-20 | Discharge: 2023-11-21 | Payer: MEDICARE

## 2023-11-20 VITALS — Ht 63.0 in | Wt 253.0 lb

## 2023-11-20 DIAGNOSIS — L600 Ingrowing nail: Secondary | ICD-10-CM

## 2023-11-20 DIAGNOSIS — E1142 Type 2 diabetes mellitus with diabetic polyneuropathy: Principal | ICD-10-CM

## 2023-11-20 DIAGNOSIS — L853 Xerosis cutis: Secondary | ICD-10-CM

## 2023-11-20 NOTE — Patient Instructions
 Return if symptoms worsen or fail to improve.  It was a pleasure seeing you in clinic today. Please contact our office if you have any questions or concerns.     Thank You,  Eleanor, LPN  Clinical Nurse for Dr. Lamar BROCKS. Marland HEATH, DPM  Podiatry  [address]  [address]  [place], [place]  [place]: [phone]  F: [phone]

## 2023-11-20 NOTE — Progress Notes
 Subjective:          Alicia Fox is a very pleasant 73 y.o. diabetic female here today for follow-up.     Recurrent bilateral toe pain due to toenails which are ingrowing and elongated. She has difficulty providing home self nail care. She stubbed her right hallux the other day, concerned about ingrown nail. Endorses minimal pain to the right hallux today.  Bilateral lower extremity swelling managed with PO Lasix  and elevation of both legs.  Denies interval pedal wounds or changes in sensation. Most recent A1c on 06/03/23 is 6.7%.     Review of Systems   Constitutional:  Negative for chills and fever.   Respiratory:  Negative for shortness of breath.    Cardiovascular:  Negative for chest pain.   Gastrointestinal:  Negative for nausea and vomiting.     Objective:          acetaminophen  (TYLENOL ) 325 mg tablet Take two tablets by mouth every 6 hours as needed. For fever or pain.    ascorbic acid (vitamin C) 500 mg tablet Take one tablet by mouth twice daily.    atorvastatin  (LIPITOR) 10 mg tablet Take one tablet by mouth at bedtime daily.    bumetanide  (BUMEX ) 2 mg tablet Take one tablet by mouth daily. Indications: visible water  retention    carvediloL  (COREG ) 3.125 mg tablet TAKE ONE TABLET BY MOUTH TWICE DAILY WITH MEALS. TAKE WITH FOOD.    cetirizine  (ZYRTEC ) 10 mg tablet Take one tablet by mouth every morning.    cholecalciferol  (VITAMIN D-3) 1,000 units tablet Take one tablet by mouth daily.    clobetasoL (TEMOVATE) 0.05 % topical cream Apply 1 inch topically to affected area twice daily. Apply to affected area    Cranberry 500 mg cap Take one capsule by mouth daily.    cranberry conc-ascorbic acid 4,200-20 mg cap Take 2 capsules by mouth every morning.    estradioL  (ESTRACE ) 0.01 % (0.1 mg/g) vaginal cream Insert or Apply one g to vaginal area twice weekly.    gabapentin  (NEURONTIN ) 100 mg capsule TAKE 1 CAPSULE BY MOUTH TWICE A DAY    guaiFENesin  LA (MUCINEX ) 600 mg tablet Take one tablet by mouth every morning.    levothyroxine  (SYNTHROID ) 88 mcg tablet TAKE 1 TABLET BY MOUTH EVERY DAY IN THE MORNING    losartan  (COZAAR ) 50 mg tablet TAKE 1 TABLET BY MOUTH TWICE A DAY    MAGNESIUM  PO Take  by mouth.    melatonin 10 mg cap Take one capsule by mouth daily.    meloxicam  (MOBIC ) 15 mg tablet TAKE 1 TABLET BY MOUTH EVERY DAY AS NEEDED FOR PAIN    metFORMIN  (GLUCOPHAGE ) 500 mg tablet Take two tablets by mouth twice daily with meals. Indications: type 2 diabetes mellitus    ONETOUCH DELICA PLUS LANCET 33 gauge USE ONE EACH AS DIRECTED DAILY AS NEEDED. DIAG CODE: DM-2 INDICATIONS: TYPE 2 DIABETES MELLITUS    ONETOUCH ULTRA TEST test strip USE ONE STRIP AS DIRECTED DAILY BEFORE BREAKFAST. DIAGNOSIS CODE: DM-2 INDICATIONS: TYPE 2 DIABETES MELLITUS    ONETOUCH ULTRA2 METER kit USE AS DIRECTED. DIAGNOSIS CODE: DM 2 INDICATIONS: TYPE 2 DIABETES MELLITUS    other medication Medication Name & Strength (List each active ingredient & strength): Emma  Dose (how much): 2  Route: by mouth Frequency (how often): daily    other medication Medication Name & Strength (List each active ingredient & strength): vitamin K2 & K7  Dose (how much): 1 Route:  by mouth  Frequency (how often): daily    pantoprazole  DR (PROTONIX ) 40 mg tablet TAKE 1 TABLET BY MOUTH TWICE A DAY    polycarbophil (FIBERCON) 625 mg tablet Take one tablet by mouth twice daily.    polyethylene glycol 3350  (MIRALAX ) 17 g packet Take one packet by mouth daily as needed.    potassium chloride  (KLOR-CON  SPRINKLE) 10 mEq capsule Take one capsule by mouth daily as needed (with torsemide ). Indications: prevention of low potassium in the blood    trospium (SANCTURA) 20 mg tablet Take one tablet by mouth twice daily. Take with water  on an empty stomach, at least 1 hour before a meal.    vit C,E-Zn-coppr-lutein-zeaxan (PRESERVISION AREDS-2) 250-200-40-1 mg-unit-mg-mg cap Take 1 capsule by mouth twice daily.    Zinc  50 mg tab Take one tablet by mouth daily.     Vitals: 11/20/23 1258   PainSc: One   Weight: 114.8 kg (253 lb)   Height: 160 cm (5' 3)       Body mass index is 44.82 kg/m?SABRA     Physical Exam  Constitutional:       Appearance: Normal appearance.   Cardiovascular:      Pulses:           Dorsalis pedis pulses are detected w/ Doppler on the right side and detected w/ Doppler on the left side.        Posterior tibial pulses are detected w/ Doppler on the right side and detected w/ Doppler on the left side.   Musculoskeletal:      Right lower leg: Edema present.      Left lower leg: Edema present.   Feet:      Right foot:      Skin integrity: Callus and dry skin present. No ulcer, erythema or warmth.      Toenail Condition: Right toenails are abnormally thick, long and ingrown. Fungal disease present.     Left foot:      Skin integrity: Callus and dry skin present. No ulcer, erythema or warmth.      Toenail Condition: Left toenails are abnormally thick, long and ingrown. Fungal disease present.     Comments:   -Diminished light touch sensation to superficial and deep peroneal nerve, saphenous, sural and tibial distributions  -5/5 strength tibialis anterior, posterior tibialis, and peroneal tendons.  -No open wounds or lesions to right hallux. No pain on palpation to any gross pedal structures of bilateral foot.  -Right hallux with incurvated nail.  Skin:     General: Skin is warm and dry.      Capillary Refill: Capillary refill takes more than 3 seconds.   Neurological:      Mental Status: She is alert.          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    -Right hallux with incurvated nail to lateral nail border, no signs of acute bacterial infection. After antiseptic cleansing, nails x10 were sharply debrided with reduction in length and thickness without incident.   -Continue daily application of emollient to dry areas of bilateral  feet, avoid interdigitally.  -Counseled on the importance of tight glycemic control and daily pedal surveillance, and the need to seek immediate medical attention if new or worsening issues arise. Return for follow-up in two months.

## 2023-11-21 ENCOUNTER — Encounter: Admit: 2023-11-21 | Discharge: 2023-11-21 | Payer: MEDICARE

## 2023-11-24 ENCOUNTER — Encounter: Admit: 2023-11-24 | Discharge: 2023-11-24 | Payer: MEDICARE

## 2023-11-25 ENCOUNTER — Ambulatory Visit: Admit: 2023-11-25 | Discharge: 2023-11-25 | Payer: MEDICARE

## 2023-11-25 ENCOUNTER — Encounter: Admit: 2023-11-25 | Discharge: 2023-11-25 | Payer: MEDICARE

## 2023-11-25 MED ORDER — MYRBETRIQ 25 MG PO TB24
25 mg | ORAL_TABLET | Freq: Every day | ORAL | 1 refills | 30.00000 days | Status: AC
Start: 2023-11-25 — End: ?

## 2023-11-26 ENCOUNTER — Encounter: Admit: 2023-11-26 | Discharge: 2023-11-26 | Payer: MEDICARE

## 2023-11-26 MED ORDER — MIRABEGRON 25 MG PO TB24
25 mg | ORAL_TABLET | Freq: Every day | ORAL | 1 refills | 30.00000 days | Status: AC
Start: 2023-11-26 — End: ?

## 2023-12-05 ENCOUNTER — Encounter: Admit: 2023-12-05 | Discharge: 2023-12-05 | Payer: MEDICARE

## 2023-12-09 ENCOUNTER — Encounter: Admit: 2023-12-09 | Discharge: 2023-12-09 | Payer: MEDICARE

## 2023-12-09 MED ORDER — METFORMIN 500 MG PO TAB
1000 mg | ORAL_TABLET | Freq: Two times a day (BID) | ORAL | 0 refills | 90.00000 days | Status: AC
Start: 2023-12-09 — End: ?

## 2023-12-12 ENCOUNTER — Encounter: Admit: 2023-12-12 | Discharge: 2023-12-12 | Payer: MEDICARE

## 2023-12-12 DIAGNOSIS — D3A090 Benign carcinoid tumor of the bronchus and lung: Principal | ICD-10-CM

## 2023-12-12 DIAGNOSIS — Z803 Family history of malignant neoplasm of breast: Secondary | ICD-10-CM

## 2023-12-12 DIAGNOSIS — Z853 Personal history of malignant neoplasm of breast: Secondary | ICD-10-CM

## 2023-12-12 DIAGNOSIS — Z8042 Family history of malignant neoplasm of prostate: Secondary | ICD-10-CM

## 2023-12-12 DIAGNOSIS — D496 Neoplasm of unspecified behavior of brain: Secondary | ICD-10-CM

## 2023-12-12 NOTE — Progress Notes
 Hereditary Cancer Clinic   Genetic Counseling  Date: 12/12/2023  Tsuyako Jolley  [place]# [id number]  DOB Feb 16, 1951        Alicia Fox, age 73 y.o., was called today for a discussion of the results of the genetic testing that was performed during our previous visit.     Discussion: During our last visit we ordered the [facility] CustomNext Panel + RNA: (85 genes: AIP, ALK, APC, ATM, ATRIP, AXIN2, BAP1, BARD1, BMPR1A, BRCA1, BRCA2, BRIP1, CDC73, CDH1, CDK4, CDKN1B, CDKN2A, CEBPA, CHEK2, CTNNA1, DDX41, DICER1, EGFR, EGLN1, EPCAM, ETV6, FH, FLCN, GATA2, GREM1, HOXB13, KIF1B, KIT, LZTR1, MAX, MBD4, MEN1, MET, MITF, MLH1, MLH3, MSH2, MSH3, MSH6, MUTYH, NF1, NF2, NTHL1, PALB2, PALLD, PDGFRA, PHOX2B, PMS2, POLD1, POLE, POT1, PRKAR1A, PTCH1, PTEN, RAD51B, RAD51C, RAD51D, RB1, RET, RPS20, RNF43, RUNX1, SDHA, SDHAF2, SDHB, SDHC, SDHD, SMAD4, SMARCA4, SMARCB1, SMARCE1, STK11, SUFU, TERT, TMEM127, TP53, TSC1, TSC2, VHL, WT1). Brittnee's testing was NEGATIVE, which means she was not found to have a mutation in any of these genes. Testing was ordered for [facility] based on her personal history of breast and lung cancers and benign brain tumor.     [name]'s testing did not find a mutation in any of the known genes associated with hereditary cancer syndromes. This means that at this time we cannot explain the cause for [name]'s cancers. Glendola's female relatives need to continue to have regular mammogram screenings and can determine if they are considered high risk to develop breast cancer within their lifetime. They can determine their risk through the National Cancer Institute's (NCI) Breast Cancer Risk Assessment Tool located at TripMetro.co.za this tool asks 8 questions then gives a lifetime prediction of developing breast cancer. If an individual's lifetime risk is 20% or higher they are considered to be high risk to develop breast cancer. Recommendations for these women are to alternate breast MRIs and mammograms every 6 months.     It is still possible that [name]'s children could have inherited a hereditary cancer syndrome from their father's family. If there is a paternal family history of cancer we recommend they consider seeing a Dentist to review the family history and determine if genetic testing is appropriate. If family members live in [place]  or [place]  and wish to pursue genetic testing they can be seen by our clinic or they can find a Dentist using the [email or web address] website. In order to schedule a genetic counseling appointment with our clinic a referral from a primary care physician or a specialist is required. Medical care providers can fax a referral to our offices at [phone], those who send a faxed referral will be contacted to schedule an appointment.       Location of Family Next Steps Required Details   Family members living in [place]  or [place]     Their doctor can send a referral to [facility] by fax at [phone] The doctor should include the following details in the referral to speed up the scheduling process:   Your genetic counselor's name (Aeliana Spates, MS CGC)  Your name Lillyn Wieczorek)  How your relative is related to you (example: sister of Thurza)   Family members who live outside of [place]  or [place]  Relatives can find a Dentist near them at Find a Dentist (NSGC) Visit the [employer] for resources and Recruitment consultant information. Their genetic counselor will need a copy of your genetic test results for their appointment.      Plan: [name] can reach  out to our clinic in 3-5 years to determine if there are newer genes for cancer susceptibility available for testing.     Time: 40 minutes. Discussed test results and implications for family members. Determined who in the family still should consider pursuing genetic testing.         Glynnis Gavel, MS [facility]  Certified Genetic Counselor  [facility]  [address]  [address] c/o Genetics team  [place], [place] [address]    Email: [email or web address]  Telephone [phone]  Fax: [phone]  Appointments: [phone]

## 2024-01-05 ENCOUNTER — Encounter: Admit: 2024-01-05 | Discharge: 2024-01-05 | Payer: MEDICARE

## 2024-01-07 ENCOUNTER — Encounter: Admit: 2024-01-07 | Discharge: 2024-01-07 | Payer: MEDICARE

## 2024-01-08 ENCOUNTER — Encounter: Admit: 2024-01-08 | Discharge: 2024-01-08 | Payer: MEDICARE

## 2024-01-08 DIAGNOSIS — E119 Type 2 diabetes mellitus without complications: Principal | ICD-10-CM

## 2024-01-08 MED ORDER — ONETOUCH DELICA PLUS LANCET 33 GAUGE MISC MISC
3 refills | Status: AC
Start: 2024-01-08 — End: ?

## 2024-01-08 MED ORDER — ONETOUCH ULTRA TEST MISC STRP
ORAL_STRIP | ORAL | 3 refills | 90.00000 days | Status: AC
Start: 2024-01-08 — End: ?

## 2024-01-14 ENCOUNTER — Encounter: Admit: 2024-01-14 | Discharge: 2024-01-14 | Payer: MEDICARE

## 2024-01-14 ENCOUNTER — Ambulatory Visit: Admit: 2024-01-14 | Discharge: 2024-01-14 | Payer: MEDICARE

## 2024-01-14 VITALS — BP 126/82 | HR 77 | Temp 97.40000°F | Ht 62.598 in | Wt 242.8 lb

## 2024-01-14 DIAGNOSIS — Z0000 Encounter for general adult medical examination without abnormal findings: Principal | ICD-10-CM

## 2024-01-14 DIAGNOSIS — R413 Other amnesia: Secondary | ICD-10-CM

## 2024-01-14 DIAGNOSIS — N393 Stress incontinence (female) (male): Secondary | ICD-10-CM

## 2024-01-14 DIAGNOSIS — I10 Essential (primary) hypertension: Secondary | ICD-10-CM

## 2024-01-14 MED ORDER — SOLIFENACIN 10 MG PO TAB
10 mg | ORAL_TABLET | Freq: Every day | ORAL | 0 refills | 90.00000 days | Status: AC
Start: 2024-01-14 — End: ?

## 2024-01-14 NOTE — Progress Notes [1]
 Date of Service: 01/14/2024    Alicia Fox is a 73 y.o. female.  DOB: [birth year]  MRN: [id number]     SUBJECTIVE       She presents today for an Annual Medicare Wellness visit.   Chief Complaint   Patient presents with    Annual Wellness Visit     LABS DONE- DISCUSS  Urinary incontinence  Medications and prices vs issues taking together- $$ as well    Blood Pressure Check     See readings     History of Present Illness  Alicia Fox is a 73 year old female who presents to clinic for annual wellness visit and is concerned about memory loss and urinary incontinence.    She experiences significant memory loss, struggling to recall names of long-time friends and recent family events. She frequently loses her phone and forgets her cane, attributing these issues to her memory lapses. She is distressed by her decline in spelling ability.    Urinary incontinence is difficult to manage, with incidents of not reaching the bathroom in time. Medication costs are a concern, as some are not covered by insurance. She is interested in trying Vesicare due to its affordability.    She had a tooth extraction two days ago, resulting in pain and swelling. The procedure was complicated by a previous root canal and broken crown. She plans to consult her dentist for further evaluation.        Problem List Review     I have reviewed and updated the problem list below and addressed acute and chronic conditions with patient or recommended a follow up plan.   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)    Brain tumor (CMS-HCC)    Moderate episode of recurrent major depressive disorder (CMS-HCC)    Type 2 diabetes mellitus without complication, without long-term current use of insulin  (CMS-HCC)    Stress incontinence of urine    Multilevel degenerative disc disease    Other insomnia    Bilateral primary osteoarthritis of knee    Hooded upper eyelid    Other headache syndrome    COVID-19 vaccine administered    Shortness of breath with exposure to severe acute respiratory syndrome coronavirus 2 (SARS-CoV-2)    Morbid obesity (CMS-HCC)    OSA (obstructive sleep apnea)    Carcinoid tumor of left lung (CMS-HCC)    Spondylosis of cervical region without myelopathy or radiculopathy    Carpal tunnel syndrome, bilateral    Abnormal CT of the head    Cough    [place] cardiac risk <10% in next 10 years    Bilateral edema of lower extremity    Diastolic heart failure (CMS-HCC)    Hyperlipemia    Hypertension    Fen-phen history        Other concerns addressed:  Urinary incontinence and memory  loss      Risk Review   Lab Draw:  Lab Results   Component Value Date/Time    HGBA1C 6.4 (H) 11/25/2023 11:59 AM    HGBA1C 6.7 (H) 06/03/2023 11:59 AM    HGBA1C 6.9 (H) 12/26/2022 10:26 AM         Lab Results   Component Value Date    CHOL 172 11/25/2023    LDL 95 11/25/2023     The 89-bzjm ASCVD risk score (Arnett DK, et al., 2019) is: 31.9%    Values used to calculate the score:      Age: 48 years      Clinically relevant sex: Female      Is Non-Hispanic African American: No      Diabetic: Yes      Tobacco smoker: No      Systolic Blood Pressure: 126 mmHg      Is BP treated: Yes      HDL Cholesterol: 76 mg/dL      Total Cholesterol: 172 mg/dL  Preventive Medications   Statin assessment: I have reviewed the patient's ASCVD risk and patient: Patient is on statin    Aspirin  assessment: Patient does have a history of cardiovascular disease and after risk benefit assessment, patient has contraindication to aspirin  or risk outweighs benefit h/o GI bleed or ulcers, other bleeding, age>70 years, thrombocytopenia, coagulopathy, CKD, on NSAIDS, steroids, anticoag).    Preventive Screening   I reviewed the preventive care and immunizations information with the patient and discussed a plan of care.    Health Maintenance   Topic Date Due    DIABETES HBA1C  05/24/2024    COVID-19 VACCINE (11 - 2025-26 season) 07/03/2024    DIABETES FOOT EXAM (.dmfoot)  09/17/2024    DIABETES DILATED EYE EXAM  09/23/2024    COLORECTAL CANCER SCREENING  10/02/2024    BREAST CANCER SCREENING  11/24/2024    MEDICARE ANNUAL WELLNESS VISIT  01/13/2025    ADVANCE CARE PLANNING DISCUSSION AND DOCUMENTATION  01/13/2025    DTAP/TDAP VACCINES (3 - Td or Tdap) 01/12/2032    OSTEOPOROSIS SCREENING/MONITORING  Completed    SHINGLES RECOMBINANT VACCINE  Completed    PNEUMOCOCCAL VACCINE AGE 48 AND OVER  Completed    HEPATITIS C SCREENING  Completed    RSV VACCINE (60 years and Older/Pregnant Women)  Completed    DEPRESSION SCREENING  Completed    INFLUENZA VACCINE  Completed    HPV VACCINES  Aged Out    MENINGOCOCCAL B VACCINE  Aged Out         Health Risk Assessment Questionnaire     The patient completed a health risk assessment with results reviewed and addressed with patient.    Health Risk Assessment Questionnaire  Current Care  List of Providers you have seen in the last two years: Vascular Surgeon A.Nagji ;Neuro Surgeon  R.Chamoun ;Pulmonologist D.Stevens;Urologist T.Griebling ;Podiatrist R.Barton;Hand Surgeon M.Drake;Endocrinologist C.Burns;Internist/Primary J.Jayke Caul;Cardiologist M.Hannen;Geneticist D.Cox;Retina Specialist T.Whitaker;Dermatologist M.Beatriz Settles (Not [facility]);Dentist Filliman (Not [facility])  Are you receiving home health?: No  During the past 4 weeks, how would you rate your health in general?: (!) Fair    Outside Care  Since your last PCP visit, have you received care outside of [facility]?: (!) Yes  What type of care did you receive outside of [facility]? (select all that apply): (!) Specialty Visit  What is the Facility where you received care and the provider's name?: Dermatologist M.Felton Buczynski [facility]  [place];Dentist [facility] [place]    Physical Activity  Do you exercise or are you physically active?: (!) No             Diet  In the past month, were you worried whether your food would run out before you or your family had money to buy more?: No  In the past 7 days, how many times did you eat fast food or junk food or pizza?: 0  In the past 7 days, how many servings of fruits or vegetables did you eat each day?: (!) 2-3  In the past 7 days, how many sodas and sugar sweetened drinks (regular, not diet) did you drink each day?: 0    Smoke/Tobacco Use  Are you currently a smoker?: No       Alcohol Use  Do you drink alcohol?: No             Other Symptoms  How would you rate your pain today?: Very mild pain  Do you feel tired during the day?: (!) Yes  Do you feel stress - tense, restless, nervous, anxious - or unable to sleep at night because your mind is troubled?: No  Have you often felt angry or had difficulty controlling your temper?: No  Do you often feel lonely?: (!) Yes    Ambulation  Do you use any assistive devices for ambulation?: (!) Yes  What types of device? (select all that apply): Cane    Fall Risk  Does it take you longer than 30 seconds to get up and out of a chair?: No  Have you fallen in the past year?: (!) Yes  Fall History (last 43mo): (!) Two or More Falls    Motor Vehicle Safety  Do you fasten your seat belt when you are in the car?: Yes    Sun Exposure  Do you protect yourself from the sun? For example, wear sunscreen when outside.: (!) No    Hearing Loss  Do you have trouble hearing the television or radio when others do not?: No  Do you have to strain or struggle to hear/understand conversation?: No  Do you use hearing aids?: No    Cognitive Impairment  During the past 12 months, have you experienced confusion or memory loss that is happening more often or is getting worse?: (!) Yes    Functional Screen  Do you live alone?: No  Do you live at: Home  Can you drive your own car or travel alone by bus or taxi?: Yes  Can you shop for groceries or clothes without help?: Yes  Can you prepare your own meals?: Yes  Can you do your own housework (such as laundry or other household tasks) without help?: Yes  Can you handle your own money without help?: Yes  Do you need help eating, bathing, dressing, or getting around your home?: No  Do you feel safe?: Yes  Does anyone at home hurt you, hit you, or threaten you?: No  Have you ever been the victim of abuse?: No    Home Safety  Does your home have grab bars in the bathroom?: Yes  Does your home have hand rails on stairs and steps?: Yes  Does your home have functioning smoke alarms?: Yes    Advance Directive  Do you have a living will or Advance Directive?: Yes       Dental Screen  Have you had an exam by your dentist in the last year?: Yes  Vision Screen  Do you have diabetes?: (!) Yes  When was your last eye exam?: 10/15/23  Eye Doctor Name?: Debby Quan  Eye Doctor Facility?: [facility]    Urinary Incontinence  Have you had urine leakage in the past 6 months?: (!) Yes  Does your urine leakage negatively impact your daily activities or sleep?: (!) Yes    Social Drivers of Health     Social Drivers of Health with Concerns    Within the past 12 months we worried whether our food would run out before we got money to buy more.: (!) (Patient-Rptd) Sometimes True  Within the past 12 months the food we bought just didn't last and we didn't have money to get more.: (!) (Patient-Rptd) Sometimes True  Do you often feel that you lack companionship?: (!) (Patient-Rptd) Yes        History Review   Past Medical History[1]  Surgical History[2]   reports that she quit smoking about 34 years ago. Her smoking use included cigarettes. She started smoking about 49 years ago. She has a 7.5 pack-year smoking history. She has never used smokeless tobacco. She reports that she does not currently use alcohol. She reports that she is not currently sexually active and has had partner(s) who are female. She reports using the following methods of birth control/protection: Surgical and Abstinence. She reports that she does not use drugs.  Family History[3]  Vaping/E-liquid Use    Vaping Use Never User      Vaping/E-liquid Substances    CBD No     Nicotine No     THC No            Allergies[4]  Review of Systems       Review of Systems    OBJECTIVE     Vitals:    01/14/24 0919   BP: 126/82   BP Source: Arm, Left Upper   Pulse: 77   Temp: 36.3 ?C (97.4 ?F)   SpO2: 96%   TempSrc: Temporal   PainSc: Six   Weight: 110.1 kg (242 lb 12.8 oz)   Height: 159 cm (5' 2.6)     Body mass index is 43.56 kg/m?SABRA   Physical Exam  Constitutional:       Appearance: Normal appearance.   Cardiovascular:      Rate and Rhythm: Normal rate and regular rhythm.   Pulmonary:      Effort: Pulmonary effort is normal.      Breath sounds: Normal breath sounds.   Neurological:      Mental Status: She is alert and oriented to person, place, and time.   Psychiatric:         Mood and Affect: Mood normal.         Behavior: Behavior normal.            Depression Screening:  Depression screening 5 minutes  Patient Scores:  : PHQ-2 Score: (Patient-Rptd) 2 (01/07/2024 12:55 PM)    Patient Health Questionnaire (PHQ)-9: PHQ-9 Score: (Patient-Rptd) 7 (06/11/2023  9:22 AM)    Interventions:  Patient Health Questionnaire (PHQ)-2: PHQ-2 Score less than 3: No follow-up or recommendations are necessary at this time (09/02/2023  2:53 PM)    Depression Interventions Patient Health Questionnaire (PHQ)-2/9: No data recorded   Cognitive Assessment    #1: Day of the week: 1  #2: Year: 1  #3: State: 1  #4: Examiner states 5 objects (No Score, Enter 0): 0  #5: $100 math problem: 1  #6:  Animals in 1 minute: 3  #7: Recall of objects from #4: 5  #8: Numbers backwards: 2  #9: Clock face and time: 2  #10: X in triangle and identify largest: 2  #11: Story: 6  SLUMS Score (Calculated): 24  Education Level: High School         SLUMS Exam Scoring  High School Education  Less than High School Education   27-30 Normal 25-30   21-26 MNCD* 20-24   1-20 Dementia 1-19   *Mild Neurocognitive Disorder     Get-up-and-go Test  Less than 12 seconds, normal    Medications   I have completed a medication reconciliation, discussed medication adherence, and reviewed the list for high-risk medications (BEERS) and results are below:      acetaminophen  (TYLENOL ) 325 mg tablet Take two tablets by mouth every 6 hours as needed. For fever or pain.    ascorbic acid (vitamin C) 500 mg tablet Take one tablet by mouth twice daily.    atorvastatin  (LIPITOR) 10 mg tablet Take one tablet by mouth at bedtime daily.    bumetanide  (BUMEX ) 2 mg tablet Take one tablet by mouth daily. Indications: visible water  retention    carvediloL  (COREG ) 3.125 mg tablet TAKE ONE TABLET BY MOUTH TWICE DAILY WITH MEALS. TAKE WITH FOOD.    cetirizine  (ZYRTEC ) 10 mg tablet Take one tablet by mouth every morning.    cholecalciferol  (VITAMIN D-3) 1,000 units tablet Take one tablet by mouth daily.    Cranberry 500 mg cap Take one capsule by mouth daily.    cranberry conc-ascorbic acid 4,200-20 mg cap Take 2 capsules by mouth every morning.    estradioL  (ESTRACE ) 0.01 % (0.1 mg/g) vaginal cream Insert or Apply one g to vaginal area twice weekly.    gabapentin  (NEURONTIN ) 100 mg capsule TAKE 1 CAPSULE BY MOUTH TWICE A DAY    guaiFENesin  LA (MUCINEX ) 600 mg tablet Take one tablet by mouth every morning.    levothyroxine  (SYNTHROID ) 88 mcg tablet TAKE 1 TABLET BY MOUTH EVERY DAY IN THE MORNING    losartan  (COZAAR ) 50 mg tablet TAKE 1 TABLET BY MOUTH TWICE A DAY    MAGNESIUM  PO Take  by mouth.    melatonin 10 mg cap Take one capsule by mouth daily.    meloxicam  (MOBIC ) 15 mg tablet TAKE 1 TABLET BY MOUTH EVERY DAY AS NEEDED FOR PAIN    metFORMIN  (GLUCOPHAGE ) 500 mg tablet Take two tablets by mouth twice daily with meals. Indications: type 2 diabetes mellitus    ONETOUCH DELICA PLUS LANCET 33 gauge USE ONE EACH AS DIRECTED DAILY AS NEEDED. DIAG CODE: DM-2 INDICATIONS: TYPE 2 DIABETES MELLITUS    ONETOUCH ULTRA TEST test strip USE ONE STRIP AS DIRECTED DAILY BEFORE BREAKFAST DIAGNOSIS CODE: DM-2 TYPE 2 DIABETES MELLITUS    ONETOUCH ULTRA2 METER kit USE AS DIRECTED. DIAGNOSIS CODE: DM 2 INDICATIONS: TYPE 2 DIABETES MELLITUS    other medication LIVE IT UP GOLDEN HOURS    other medication LIVE IT UP SUPER GREENS    other medication 100 mcg. VITAMIN K 0.1 MG ORAL TABLET    other medication Medication Name & Strength (List each active ingredient & strength): Emma  Dose (how much): 2  Route: by mouth Frequency (how often): daily    other medication Medication Name & Strength (List each active ingredient & strength): vitamin K2 & K7  Dose (how much): 1 Route: by mouth  Frequency (how often): daily    pantoprazole  DR (PROTONIX ) 40  mg tablet TAKE 1 TABLET BY MOUTH TWICE A DAY    polycarbophil (FIBERCON) 625 mg tablet Take one tablet by mouth twice daily.    polyethylene glycol 3350  (MIRALAX ) 17 g packet Take one packet by mouth daily as needed.    potassium chloride  (KLOR-CON  SPRINKLE) 10 mEq capsule TAKE 1 CAPSULE BY MOUTH TWICE A DAY    solifenacin (VESICARE) 10 mg tablet Take one tablet by mouth daily. Indications: a condition where the urge to urinate results in urine leakage    vit C,E-Zn-coppr-lutein-zeaxan (PRESERVISION AREDS-2) 250-200-40-1 mg-unit-mg-mg cap Take 1 capsule by mouth twice daily.    Zinc  50 mg tab Take one tablet by mouth daily.          ASSESSMENT AND PLAN       Personal prevention plan reviewed with patient and is accessible via patient's After Visit Summary and visit note.    Alicia Fox was seen today for annual wellness visit and blood pressure check.    Diagnoses and all orders for this visit:    Encounter for Medicare annual wellness exam    Stress incontinence of urine    Primary hypertension    Memory loss  -     Vitamin B12; Future; Expected date: 01/14/2024  -     Alzheimer?S Disease Evaluation, Plasma (Ptau217)/Amyloid Evaluation; Future; Expected date: 01/14/2024    Other orders  -     solifenacin (VESICARE) 10 mg tablet; Take one tablet by mouth daily. Indications: a condition where the urge to urinate results in urine leakage      Assessment & Plan  Adult Wellness Visit  Wellness visit  - Reviewed HRA  - Reviewed lab  - Patient is up-to-date on age-appropriate screening    Hypertension, controlled  - Continue current meds    Memory loss  Significant memory loss post-brain surgery with abnormal memory test results.   - Ordered vitamin B12 level.  - Ordered Alzheimer's screening blood test.  - If Alzheimer's test is abnormal, will refer to dementia specialist for further evaluation.  - Reviewed most recent brain MRI from this year    Stress urinary incontinence  Severe stress urinary incontinence with significant leakage.   - Prescribed Vesicare once daily.      New Medication Orders this Encounter   Medications    other medication     Sig: LIVE IT UP GOLDEN HOURS    other medication     Sig: LIVE IT UP SUPER GREENS    other medication     Sig: 100 mcg. VITAMIN K 0.1 MG ORAL TABLET    solifenacin (VESICARE) 10 mg tablet     Sig: Take one tablet by mouth daily. Indications: a condition where the urge to urinate results in urine leakage     Dispense:  90 tablet     Refill:  0      Medications Discontinued During This Encounter   Medication Reason    clobetasoL (TEMOVATE) 0.05 % topical cream     MYRBETRIQ  25 mg ER tablet      Patient Instructions     Health Maintenance   Topic Date Due    DIABETES HBA1C  05/24/2024    COVID-19 VACCINE (11 - 2025-26 season) 07/03/2024    DIABETES FOOT EXAM (.dmfoot)  09/17/2024    DIABETES DILATED EYE EXAM  09/23/2024 COLORECTAL CANCER SCREENING  10/02/2024    BREAST CANCER SCREENING  11/24/2024    MEDICARE ANNUAL WELLNESS VISIT  01/13/2025  ADVANCE CARE PLANNING DISCUSSION AND DOCUMENTATION  01/13/2025    DTAP/TDAP VACCINES (3 - Td or Tdap) 01/12/2032    OSTEOPOROSIS SCREENING/MONITORING  Completed    SHINGLES RECOMBINANT VACCINE  Completed    PNEUMOCOCCAL VACCINE AGE 9 AND OVER  Completed    HEPATITIS C SCREENING  Completed    RSV VACCINE (60 years and Older/Pregnant Women)  Completed    DEPRESSION SCREENING  Completed    INFLUENZA VACCINE  Completed    HPV VACCINES  Aged Out    MENINGOCOCCAL B VACCINE  Aged Out      Visit Disposition       Dispositions    Return in about 1 year (around 01/13/2025) for Physical-November.                          [1]   Past Medical History:  Diagnosis Date    Allergy 1980    Codeine    Arthritis     lumbar, knees    Back pain 1965    Degenerative spinal disease and lumber disks    Bowel trouble 1986    Easily to be constipating due to diet and medications    BRCA1 negative     BRCA2 negative     Breast cancer (CMS-HCC)     Cancer (CMS-HCC) 1986, 1996, 2019    breast-  LT    Cancer of lung (CMS-HCC) 2019    Lobectomy lower left    Carpal tunnel syndrome of left wrist 10/09/2016    Added automatically from request for surgery [id number]      Carpal tunnel syndrome of right wrist 10/22/2016    Carpal tunnel syndrome on both sides 2018    L > R    Cataract 2020    Complication of anesthesia 1986    Nausea    COVID-19 02/05/2019    Degenerative disc disease, lumbar 2023    Degenerative disc disease, thoracic 2023    Depression     Seasonal and close family and friends deaths    Disorder of thyroid gland 1976    hypothyroid    Diverticulitis     DM (diabetes mellitus) (CMS-HCC) 2022    Pre-diabetic    Gastrointestinal disorder 2019    gerd at times-under control with meds    GERD (gastroesophageal reflux disease)     Heart disease 2021    Pericardiocentesis    History of blood transfusion Aug 1986 During Hysterectomy surgery    History of dental problems 2023    Bottom front teeth shifting. May be from 48yrs of CPAP mask    HTN (hypertension) 2010    Hx of adenomatous colonic polyps     Hypercalcemia     Hyperlipidemia     Hypothyroid     Knee pain     right    Low back pain     Obesity     OSA on CPAP     Osteoarthritis     Osteoporosis 2021    Osteopenia    Personal history of breast cancer 1986    Pneumonia     bilateral bacterial    Pneumonia due to COVID-19 virus 02/15/2019    Poor balance     Psychiatric illness     depression    Recurrent infections     Multiple UTI    Sleep apnea 2019    CPAP user since 2019    Stomach disorder  Unspecified deficiency anemia 2023    Vision decreased     AMD & Cataracts   [2]   Past Surgical History:  Procedure Laterality Date    ANUS SURGERY  01/2010    anal fissure    BREAST AUGMENTATION Bilateral 1987    BREAST BIOPSY  07/1984    BREAST SURGERY Bilateral [id number]    implants    BRONCHOSCOPY N/A 01/13/2017    BRONCHOSCOPY performed by [facility]igg[facility]    BRONCHOSCOPY N/A 01/13/2017    BRONCHOSCOPY WITH TRANSBRONCHIAL BIOPSY performed by Shereen Rigg, MD [facility]    BRONCHOSCOPY N/A 01/13/2017    BRONCHOSCOPY WITH ENDOBRONCHIAL ULTRASOUND GUIDED TRANSTRACHEAL/ TRANSBRONCHIAL SAMPLING - 3 OR MORE MEDIASTINAL/ HILAR LYMPH NODE STATIONS/ STRUCTURE - RIGID performed by Shereen Rigg, MD [facility]    BRONCHOSCOPY N/A 01/13/2017    BRONCHOSCOPY WITH IMAGE - GUIDED NAVIGATION - FLEXIBLE performed by Shereen Rigg, MD [facility]    CARDIAC SURGERY  2021    CARDIOVASCULAR STRESS TEST  2023    CARPAL TUNNEL RELEASE Left 10/17/2016    LEFT DECOMPRESSION NERVE MEDIAN WITH CARPAL TUNNEL RELEASE performed by Nida Evalene BRAVO, MD at [facility]    COLONOSCOPY N/A 10/01/2019    Colonoscopy performed by Melvenia Dana, MD at [facility]    COLONOSCOPY  10/01/2019    COLONOSCOPY WITH SNARE REMOVAL TUMOR/ POLYP/ OTHER LESION performed by Melvenia Dana, MD at [facility]    COLONOSCOPY N/A 10/02/2021    Colonoscopy performed by Melvenia Dana, MD at [facility] OR    CRANIOTOMY Left 11/26/2022    Left fronto-temporal craniotomy, resection of middle fossa tumor performed by Jaycee Maranda NOVAK, MD at [facility]    CRANIOTOMY Left 11/26/2022    Left fronto-temporal craniotomy, resection of middle fossa tumor performed by Jaycee Maranda NOVAK, MD at [facility] OR    ECHOCARDIOGRAM PROCEDURE  2024    HIP SURGERY  02/1995    Hip grafting for cervical fusion    HX ABDOMINOPLASTY  10/1994    HX COSMETIC SURGERY  1996    Abdominoplasty    HX DILATION AND CURETTAGE  07/1994    HX FUSION PROCEDURE      Cervical twice in neck, fused disks    HX HYSTERECTOMY  10/1994    complete    HX LIPOMA RESECTION  11/1999    outer upper left arm    HX LIPOMA RESECTION  02/2010    inside upper right arm    HX LOBECTOMY  2019    Lower left lung removed.  Cancer    HX MASTECTOMY Bilateral 1986    1986-stomberg mastectomy    HYSTERECTOMY  1996    MASTECTOMY, MODIFIED RADICAL Bilateral 1987    1987-full modified    MASTECTOMY, PARTIAL Bilateral 12/1984    PERICARDIOCENTESIS  2021    Heart Failure Due to Covid; pericardial effusion    SPINE SURGERY  1993 and 1996    cervical fusion    UPPER GASTROINTESTINAL ENDOSCOPY N/A 10/01/2019    ESOPHAGOGASTRODUODENOSCOPY WITH SPECIMEN COLLECTION BY BRUSHING/ WASHING performed by Melvenia Dana, MD at [facility]    UPPER GASTROINTESTINAL ENDOSCOPY N/A 10/01/2019    ESOPHAGOGASTRODUODENOSCOPY WITH SNARE REMOVAL TUMOR/ POLYP/ OTHER LESION - FLEXIBLE performed by Melvenia Dana, MD at [facility] END[facility]    UPPER GASTROINTESTINAL ENDOSCOPY N/A 10/01/2019    ESOPHAGOGASTRODUODENOSCOPY WITH BIOPSY - FLEXIBLE performed by Melvenia Dana, MD at [facility] END[facility]   [3]   Family  History  Problem Relation Age of Onset    Complete Hysterectomy Mother 41        uterine prolaps    Hypertension Mother         Covid-19 fatality Feb 2021    Atrial Fibrillation Mother     Cataract Mother     Hypertension Father COPD, PARKINSON'S death 02-11-17    COPD Father     Heart Failure Father         COPD, PARKINSON'S death 2017-02-11    Parkinson's  Father     Vision Loss Father         AMD    Arthritis Sister     SLE Sister         lupus    Stomach tumor (benign) Brother 85    Kidney Cancer Brother 66        second kidney tumor benign dx 12    Cancer-Prostate Brother 60    Hypertension Brother         A-fib. 2022-02-11    Birth Defect Brother         Mentally handicapped    Early Death Brother         Pneumonia staph sepsis  at 9yrs    Mental Illness Brother     Heart Attack Paternal Uncle     Cancer-Hematologic Maternal Grandfather 21 - 46        leukemia    Cancer-Breast Paternal Grandmother 80    Complete Hysterectomy Daughter     Heart Attack Daughter 65    Cancer-Breast Maternal Aunt 1983/02/12    Heart Failure Brother         Heart valves,  pericardiocentesis    Hypertension Brother         Pericardiocentesus    Cancer Brother         Prostrate and kidney    Intellectual Disability Brother     Arthritis-rheumatoid Sister    [4]   Allergies  Allergen Reactions    Codeine HEADACHE and ITCHING

## 2024-01-14 NOTE — Patient Instructions [37]
 Health Maintenance   Topic Date Due    DIABETES HBA1C  05/24/2024    COVID-19 VACCINE (11 - 2025-26 season) 07/03/2024    DIABETES FOOT EXAM (.dmfoot)  09/17/2024    DIABETES DILATED EYE EXAM  09/23/2024    COLORECTAL CANCER SCREENING  10/02/2024    BREAST CANCER SCREENING  11/24/2024    MEDICARE ANNUAL WELLNESS VISIT  01/13/2025    ADVANCE CARE PLANNING DISCUSSION AND DOCUMENTATION  01/13/2025    DTAP/TDAP VACCINES (3 - Td or Tdap) 01/12/2032    OSTEOPOROSIS SCREENING/MONITORING  Completed    SHINGLES RECOMBINANT VACCINE  Completed    PNEUMOCOCCAL VACCINE AGE 34 AND OVER  Completed    HEPATITIS C SCREENING  Completed    RSV VACCINE (60 years and Older/Pregnant Women)  Completed    DEPRESSION SCREENING  Completed    INFLUENZA VACCINE  Completed    HPV VACCINES  Aged Out    MENINGOCOCCAL B VACCINE  Aged Out

## 2024-01-15 ENCOUNTER — Encounter: Admit: 2024-01-15 | Discharge: 2024-01-15 | Payer: MEDICARE

## 2024-01-15 MED ORDER — TRAMADOL 50 MG PO TAB
50 mg | ORAL_TABLET | ORAL | 0 refills | 7.00000 days | Status: AC | PRN
Start: 2024-01-15 — End: ?

## 2024-01-16 ENCOUNTER — Encounter: Admit: 2024-01-16 | Discharge: 2024-01-16 | Payer: MEDICARE

## 2024-01-17 ENCOUNTER — Encounter: Admit: 2024-01-17 | Discharge: 2024-01-17 | Payer: MEDICARE

## 2024-01-18 ENCOUNTER — Encounter: Admit: 2024-01-18 | Discharge: 2024-01-18 | Payer: MEDICARE

## 2024-01-18 MED ORDER — METFORMIN 500 MG PO TAB
1000 mg | ORAL_TABLET | Freq: Two times a day (BID) | ORAL | 1 refills | 90.00000 days | Status: AC
Start: 2024-01-18 — End: ?

## 2024-01-20 ENCOUNTER — Encounter: Admit: 2024-01-20 | Discharge: 2024-01-20 | Payer: MEDICARE

## 2024-01-20 ENCOUNTER — Ambulatory Visit: Admit: 2024-01-20 | Discharge: 2024-01-21 | Payer: MEDICARE

## 2024-02-01 ENCOUNTER — Encounter: Admit: 2024-02-01 | Discharge: 2024-02-01 | Payer: MEDICARE

## 2024-02-02 ENCOUNTER — Encounter: Admit: 2024-02-02 | Discharge: 2024-02-02 | Payer: MEDICARE

## 2024-02-05 ENCOUNTER — Encounter: Admit: 2024-02-05 | Discharge: 2024-02-05 | Payer: MEDICARE

## 2024-02-10 ENCOUNTER — Encounter: Admit: 2024-02-10 | Discharge: 2024-02-10 | Payer: MEDICARE

## 2024-02-12 NOTE — Progress Notes [1]
 Date of Service: 02/13/2024    Subjective:             Alicia Fox is a 73 y.o. female with PMH of HTN, HLD, diastolic heart failure, type 2 diabetes, obesity, h/o carcinoid tumor of lung, severe OSA on CPAP, breast cancer, and uterine cancer who presents to [facility] End[facility] clinic for a routine follow up regarding elevated PTH. Patient was last seen by Roselie Hope on 08/2023 of her controlled T2DM on metformin  monotherapy who referred patient to Endocrinology for hyperparathyroidism.     History of Present Illness  Regarding Hyperparathyroidism:  - Notable for elevated levels since 06/2023 with hypercalcemia noted prior since 2018  - Pertinent History     No        Yes   x                         Daily intake of calcium - does not take supplement; only on yogurt for antibiotic therapy otherwise limits dairy due to hypercalcemia                 x           Daily intake of vitamin D - 1000 international unit(s) daily   x                         Taking lithium    x                         Taking hydrochlorothiazide    x                         Family history of hypercalcemia   x                         MEN1 or MEN2 History - negative genetic testing (MEN1)     - Surgical indications:     No      Yes               x          Nephrolithiasis - CT with small nonobstructing left renal stone in 2025 & nonobstructive right nephrolithiasis noted on CT in 2022   ?                      Hypercalcuria (24 hour urine calcium > 400 mg/dL)    x                      Impaired renal function (GFR < 60 mL/min)   x                      Osteoporosis - but does have known osteopenia   x                      Serum calcium level greater than or equal to 11.5        - Current symptoms:    Fatigue: Yes  Difficulty sleeping:  Sometimes. Wears CPAP at night  Muscle weakness:  Yes but attributes this due to lack of exercise  Muscles pains:  Sometimes has calf pain  Bone/Joint pain:  Right knee pain 2/2 arthritis - has been offered surgery, but historically declined  Headaches:  No  Increased thirst:  Drinks 60 ounces of water , couple cups of coffee/tea. Does not drink sodas or milk  Frequent urination: Yes, due to increased water  intake and diuretics   Nighttime urination: Yes, 1-2 times per night  Difficulty controlling urine: Yes, following with urology. Vesicare  is helping  Difficulty concentrating: Yes, but has noticed this change right after her brain surgery  Memory problems: Yes  Mood problems: Yes, endorses depression. Occasional irritability   Abdominal pain:No  Nausea or Vomiting: No  Heartburn: Yes, on PPI  Constipation: Yes     Review of systems:  No history of chemotherapy or radiation for any of her cancers  Right eyelid will close at night and she can't open it without manual lifting - has been offered surgery for this, but declining  Unable to take diuretics daily due to polyuria as an unwanted side effect that inhibits quality of life  Follows a low sodium diet  Having difficulty losing weight  Started taking Mg recently  A complete 14 point review of symptoms was performed and it is negative except mentioned above in the HPI    Objective:        Objective    acetaminophen  (TYLENOL ) 325 mg tablet Take two tablets by mouth every 6 hours as needed. For fever or pain.    amoxicillin  (AMOXIL ) 500 mg capsule Take one capsule by mouth three times daily.    ascorbic acid (vitamin C) 500 mg tablet Take one tablet by mouth twice daily.    atorvastatin  (LIPITOR) 10 mg tablet Take one tablet by mouth at bedtime daily.    bumetanide  (BUMEX ) 2 mg tablet Take one tablet by mouth daily. Indications: visible water  retention    carvediloL  (COREG ) 3.125 mg tablet TAKE ONE TABLET BY MOUTH TWICE DAILY WITH MEALS. TAKE WITH FOOD.    cetirizine  (ZYRTEC ) 10 mg tablet Take one tablet by mouth every morning.    chlorhexidine gluconate (PERIDEX) 0.12 % mouthwash RINSE MOUTH WITH 15 MILLILITERS TWICE A DAY AFTER BRUSHING, SWISH IN MOUTH FOR 30 SECONDS THEN SPIT    cholecalciferol  (VITAMIN D-3) 1,000 units tablet Take one tablet by mouth daily.    Cranberry 500 mg cap Take one capsule by mouth daily.    cranberry conc-ascorbic acid 4,200-20 mg cap Take 2 capsules by mouth every morning.    estradioL  (ESTRACE ) 0.01 % (0.1 mg/g) vaginal cream Insert or Apply one g to vaginal area twice weekly.    gabapentin  (NEURONTIN ) 100 mg capsule TAKE 1 CAPSULE BY MOUTH TWICE A DAY    guaiFENesin  LA (MUCINEX ) 600 mg tablet Take one tablet by mouth every morning.    HYDROcodone/acetaminophen  (NORCO) 5/325 mg tablet Take one tablet by mouth every 6 hours as needed for Pain.    levothyroxine  (SYNTHROID ) 88 mcg tablet TAKE 1 TABLET BY MOUTH EVERY DAY IN THE MORNING    losartan  (COZAAR ) 50 mg tablet TAKE 1 TABLET BY MOUTH TWICE A DAY    MAGNESIUM  PO Take  by mouth.    melatonin 10 mg cap Take one capsule by mouth daily.    meloxicam  (MOBIC ) 15 mg tablet TAKE 1 TABLET BY MOUTH EVERY DAY AS NEEDED FOR PAIN    metFORMIN  (GLUCOPHAGE ) 500 mg tablet TAKE TWO TABLETS BY MOUTH TWICE DAILY WITH MEALS. INDICATIONS: TYPE 2 DIABETES MELLITUS    ONETOUCH DELICA PLUS LANCET 33 gauge USE ONE EACH AS DIRECTED DAILY AS NEEDED. DIAG CODE: DM-2 INDICATIONS: TYPE 2 DIABETES MELLITUS    ONETOUCH ULTRA TEST  test strip USE ONE STRIP AS DIRECTED DAILY BEFORE BREAKFAST DIAGNOSIS CODE: DM-2 TYPE 2 DIABETES MELLITUS    ONETOUCH ULTRA2 METER kit USE AS DIRECTED. DIAGNOSIS CODE: DM 2 INDICATIONS: TYPE 2 DIABETES MELLITUS    other medication LIVE IT UP GOLDEN HOURS    other medication LIVE IT UP SUPER GREENS    other medication 100 mcg. VITAMIN K 0.1 MG ORAL TABLET    other medication Medication Name & Strength (List each active ingredient & strength): Emma  Dose (how much): 2  Route: by mouth Frequency (how often): daily    other medication Medication Name & Strength (List each active ingredient & strength): vitamin K2 & K7  Dose (how much): 1 Route: by mouth  Frequency (how often): daily    pantoprazole  DR (PROTONIX ) 40 mg tablet TAKE 1 TABLET BY MOUTH TWICE A DAY    polycarbophil (FIBERCON) 625 mg tablet Take one tablet by mouth twice daily.    polyethylene glycol 3350  (MIRALAX ) 17 g packet Take one packet by mouth daily as needed.    potassium chloride  (KLOR-CON  SPRINKLE) 10 mEq capsule TAKE 1 CAPSULE BY MOUTH TWICE A DAY    solifenacin  (VESICARE ) 10 mg tablet Take one tablet by mouth daily. Indications: a condition where the urge to urinate results in urine leakage    torsemide  (DEMADEX ) 20 mg tablet may take a second daily dose if needed    traMADoL  (ULTRAM ) 50 mg tablet Take one tablet by mouth every 8 hours as needed for Pain. Indications: pain    vit C,E-Zn-coppr-lutein-zeaxan (PRESERVISION AREDS-2) 250-200-40-1 mg-unit-mg-mg cap Take 1 capsule by mouth twice daily.    Zinc  50 mg tab Take one tablet by mouth daily.     Vitals:    02/13/24 1117   BP: (!) 158/81   BP Source: Arm, Left Upper   Pulse: 74   Resp: 18   PainSc: Zero   Weight: 110.2 kg (243 lb)   Height: 157.5 cm (5' 2)     Body mass index is 44.45 kg/m?SABRA   Physical Exam  Constitutional:       General: She is not in acute distress.     Appearance: Normal appearance. She is not toxic-appearing.   HENT:      Head: Normocephalic and atraumatic.      Nose: Nose normal.   Eyes:      Extraocular Movements: Extraocular movements intact.      Conjunctiva/sclera: Conjunctivae normal.   Pulmonary:      Effort: Pulmonary effort is normal. No respiratory distress.   Musculoskeletal:         General: No signs of injury.   Skin:     General: Skin is warm and dry.   Neurological:      General: No focal deficit present.      Mental Status: She is alert and oriented to person, place, and time.   Psychiatric:         Mood and Affect: Mood normal.         Behavior: Behavior normal.         Thought Content: Thought content normal.         Judgment: Judgment normal.         Latest Reference Range & Units 06/18/23 11:33 11/25/23 11:59   PTH Hormone 10.0 - 65.0 pg/mL 96.0 (H) 102.9 (H)   (H): Data is abnormally high     Latest Reference Range & Units 11/08/20 13:32 12/26/21 10:55 05/29/22 11:57 11/26/22 07:07  11/27/22 02:00 11/28/22 01:39 11/28/22 07:44 11/29/22 03:32 11/30/22 03:22 12/01/22 04:15 12/26/22 10:26 06/03/23 11:59 06/18/23 11:33 11/25/23 11:59   Albumin 3.5 - 5.0 g/dL 4.3 4.2  4.3       4.3 4.5  4.4   Calcium 8.5 - 10.6 mg/dL 89.2 (H) 89.4 89.0 (H) 11.4 (H) 10.2 10.2  10.2 9.7  10.9 (H) 11.3 (H)  10.9 (H)   Magnesium  1.6 - 2.6 mg/dL   1.9  1.8 2.3  2.5 2.0 2.1       Phosphorus 2.0 - 4.5 MG/DL     2.2 1.3 (LL) 1.1 (LL) 1.3 (LL) 2.0 2.1       Alk Phosphatase 25 - 110 U/L 117 (H) 111 (H)  114 (H)       109 117 (H)  117 (H)   Cortisol-AM 6.7 - 22.6 ?g/dL              8.1   PTH Hormone 10.0 - 65.0 pg/mL             96.0 (H) 102.9 (H)   TSH 0.35 - 5.00 ?IU/mL  1.07         1.96   1.35   Vitamin D(25-OH)Total 30 - 80 ng/mL             39    (LL): Data is critically low  (H): Data is abnormally high    DXA 02/11/2022:  LUMBAR SPINE, L1-L4   Current: 1.317 g/cm2, T-score of 1.0       Previous: 1.267 g/cm2, T-score of 0.6     There are however hypertrophic degenerative changes of the lumbar spine   which could artificially elevate the bone mineral density.     LEFT FEMORAL NECK    Current: 0.723 g/cm2, T-score of -2.3       Previous: 0.791 g/cm2, T-score of -1.8     LEFT TOTAL HIP    Current: 0.814 g/cm2, T-score of -1.5   Previous: 0.862 g/cm2, T-score of -1.2     RIGHT FEMORAL NECK   Current: 0.781 g/cm2, T-score of -1.8       Previous: 0.788 g/cm2, T-score of -1.8     RIGHT TOTAL HIP   Current: 0.778 g/cm2, T-score of -1.8   Previous: 0.832 g/cm2, T-score of -1.4      FRAX SCORE   10 year risk hip fracture = 2.8%   10 year risk major osteoporotic fracture = 12.0%      Assessment and Plan:  73 y.o. female with PMH of HTN, HLD, diastolic heart failure, type 2 diabetes, obesity, h/o carcinoid tumor of lung, severe OSA on CPAP, breast cancer, and uterine cancer who presents to [facility] Endocr[facility]ology clinic for a routine follow up regarding elevated PTH.    Hyperparathyroidism with hypercalcemia  Chronic with progression and bodily dysfunction such as nephrolithiasis  Independently reviewed and interpreted blood work showing elevated calcium level in the setting of elevated PTH indicating hyperparathyroidism  Reviewed Vitamin D levels indicating Vitamin D sufficiency  Will check 24 hr urine calcium to assess for FHH (familial hypocalciuric hypercalcemia) through [facility]  Will check DXA scan to see for bone loss  Will check PTH, renal function panel with phosphorus, iCal, Mg, ALP, bone-specific ALP, and Vitamin D  Will replete Vitamin D if low and repeat biochemical assessment with PTH and calcium after correction  Patients with asymptomatic or mildly symptomatic hypercalcemia with calcium less than 12 mg/dL do not require immediate treatment  Discussed indications  for surgical treatment with parathyroidectomy.   Will hold off on neck imaging at this time until further workup results    RTC in 3-4 months    Staff: Dr. Buren    Dr. Ronnald Gosling, MD   PGY5 Endocrinology Fellow

## 2024-02-13 ENCOUNTER — Encounter: Admit: 2024-02-13 | Discharge: 2024-02-13 | Payer: MEDICARE

## 2024-02-13 ENCOUNTER — Ambulatory Visit: Admit: 2024-02-13 | Discharge: 2024-02-14 | Payer: MEDICARE

## 2024-02-13 VITALS — BP 158/81 | HR 74 | Resp 18 | Ht 62.0 in | Wt 243.0 lb

## 2024-02-13 NOTE — Patient Instructions [37]
 It was great to see you today.    Issues we are addressing today: Hyperparathyroidism    Our plan:  Get labs  Get bone density  24 hour urine collection  I will message you on Mychart with results       24- Hour Urine Collection Directions    This procedure measures the calcium, creatinine , and sodium in the urine. The results assist us  in making recommendations regarding calcium supplements.    An approved container for the laboratory is required. Laboratories are no longer allowed to accept containers such as jars or plastic milk jugs. You will need to obtain your collection container from the lab that your insurance requires you to use.    Label the container with your name and date of birth  At the time you begin your collection, empty your bladder. Mark the time you emptied your bladder. This is the start time.  Save all the urine passed for the next 24 hours in the approved container. (Women will need to use a small container to transfer the urine to the larger collection container).  We do not need a sample from the urine, but rather the entire 24 hour collection. If the lab does not receive the entire collection, the results will be inaccurate. Remember to collect any urine passed during the night .  Keep the bottle in the refrigerator or in a container surrounded by ice.  6.   Please mark the time of the last collection.    This collection should not be collected during the time you are experiencing any vaginal bleeding    Example    Saturday- 6 AM- Empty your bladder, discard urine and mark the gallon bottle 600 AM                           Now begin collecting all urine until the next morning  Sunday - 6 AM -add this last 6 AM specimen to your collection       If you are signed up for myChart, results will be released to you as soon as they are available.  You will be able to review your results before I have the opportunity to review your results.  However, I will review all results with feedback via myChart.    Please call my nurse between appointments with any issues:  Bari Kenner RN/BSN [phone]  If you need assistance with scheduling, re-scheduling or cancelling follow up appointments: [phone], option 1  If you have any EMERGENT issues that need to be addressed after hours, then call the On-Call Endocrinologist at: [phone]  If you need to contact radiology scheduling, then call:  [phone].  If you need to contact the [facility], then call:  [phone].    Information regarding [facility] [place] [facility] Locations:     [place]  Yahoo Locations     [facility]  [address] (at the corner of [address])  [facility], 1st floor  Monday-Friday 7:00am-6:00pm  Saturday 7:00am-noon  [phone]     [facility]  [facility]  [address]  [place]  [place], [place]  Monday-Friday 8:00am-4:30pm  [phone]     [facility]   [address], 2nd floor   [place], [place]  Monday-Friday 8:00am-5:00pm  [phone]     [facility]  [facility] [place] [facility]  [address]  [place],  [place] [address]  Monday-Friday 7:00am-5:30pm  Saturday-Sunday 7:00am-noon  [phone]     [facility]  [address]  [place], [place]  Main floor   Monday-Friday 8:00am-5:00pm  [phone]     ALL SITES ABOVE TAKE WALK IN PATIENTS FOR BLOOD DRAWS

## 2024-02-14 DIAGNOSIS — E213 Hyperparathyroidism, unspecified: Secondary | ICD-10-CM

## 2024-02-14 DIAGNOSIS — E119 Type 2 diabetes mellitus without complications: Secondary | ICD-10-CM

## 2024-02-14 DIAGNOSIS — R7989 Other specified abnormal findings of blood chemistry: Principal | ICD-10-CM

## 2024-02-16 ENCOUNTER — Encounter: Admit: 2024-02-16 | Discharge: 2024-02-16 | Payer: MEDICARE

## 2024-02-16 NOTE — Telephone Encounter [36]
 Signed [facility] order form faxed to [facility] at [phone]

## 2024-02-17 ENCOUNTER — Encounter: Admit: 2024-02-17 | Discharge: 2024-02-17 | Payer: MEDICARE

## 2024-02-23 ENCOUNTER — Encounter: Admit: 2024-02-23 | Discharge: 2024-02-23 | Payer: MEDICARE

## 2024-02-23 ENCOUNTER — Ambulatory Visit: Admit: 2024-02-23 | Discharge: 2024-02-23 | Payer: MEDICARE

## 2024-02-27 ENCOUNTER — Encounter: Admit: 2024-02-27 | Discharge: 2024-02-27 | Payer: MEDICARE

## 2024-03-07 ENCOUNTER — Encounter: Admit: 2024-03-07 | Discharge: 2024-03-07 | Payer: MEDICARE

## 2024-03-08 ENCOUNTER — Encounter: Admit: 2024-03-08 | Discharge: 2024-03-08 | Payer: MEDICARE

## 2024-03-16 ENCOUNTER — Encounter: Admit: 2024-03-16 | Discharge: 2024-03-16 | Payer: MEDICARE

## 2024-03-23 ENCOUNTER — Encounter: Admit: 2024-03-23 | Discharge: 2024-03-23 | Payer: MEDICARE

## 2024-03-23 ENCOUNTER — Ambulatory Visit: Admit: 2024-03-23 | Discharge: 2024-03-23 | Payer: MEDICARE

## 2024-03-23 ENCOUNTER — Ambulatory Visit: Admit: 2024-03-23 | Discharge: 2024-03-24 | Payer: MEDICARE

## 2024-04-01 ENCOUNTER — Encounter: Admit: 2024-04-01 | Discharge: 2024-04-01 | Payer: MEDICARE

## 2024-04-01 MED ORDER — BUMETANIDE 2 MG PO TAB
2 mg | ORAL_TABLET | Freq: Every day | ORAL | 3 refills | 30.00000 days | Status: AC
Start: 2024-04-01 — End: ?

## 2024-04-05 ENCOUNTER — Encounter: Admit: 2024-04-05 | Discharge: 2024-04-05 | Payer: MEDICARE

## 2024-04-05 MED ORDER — SOLIFENACIN 10 MG PO TAB
10 mg | ORAL_TABLET | Freq: Every day | ORAL | 0 refills | 90.00000 days | Status: AC
Start: 2024-04-05 — End: ?

## 2024-04-05 NOTE — Telephone Encounter [36]
 Medication Name: Vesicare  10 mg  Last office  visit: 01/14/2024  Next office visit: none  Last labs drawn: 02/23/2024
